# Patient Record
Sex: Female | Born: 1955 | Race: Black or African American | Hispanic: No | Marital: Single | Smoking: Never smoker
Health system: Southern US, Community
[De-identification: ages and names within clinical notes are randomized; demographics above are authoritative.]

## PROBLEM LIST (undated history)

## (undated) DIAGNOSIS — G3184 Mild cognitive impairment, so stated: Secondary | ICD-10-CM

## (undated) DIAGNOSIS — F0390 Unspecified dementia without behavioral disturbance: Secondary | ICD-10-CM

## (undated) DIAGNOSIS — I639 Cerebral infarction, unspecified: Secondary | ICD-10-CM

## (undated) DIAGNOSIS — F419 Anxiety disorder, unspecified: Secondary | ICD-10-CM

## (undated) DIAGNOSIS — F329 Major depressive disorder, single episode, unspecified: Secondary | ICD-10-CM

## (undated) DIAGNOSIS — F32A Depression, unspecified: Secondary | ICD-10-CM

## (undated) DIAGNOSIS — E119 Type 2 diabetes mellitus without complications: Secondary | ICD-10-CM

## (undated) DIAGNOSIS — T7491XA Unspecified adult maltreatment, confirmed, initial encounter: Secondary | ICD-10-CM

## (undated) DIAGNOSIS — R1310 Dysphagia, unspecified: Secondary | ICD-10-CM

## (undated) DIAGNOSIS — E1140 Type 2 diabetes mellitus with diabetic neuropathy, unspecified: Secondary | ICD-10-CM

## (undated) DIAGNOSIS — G20 Parkinson's disease: Secondary | ICD-10-CM

## (undated) DIAGNOSIS — F1010 Alcohol abuse, uncomplicated: Secondary | ICD-10-CM

## (undated) DIAGNOSIS — I10 Essential (primary) hypertension: Secondary | ICD-10-CM

## (undated) DIAGNOSIS — E785 Hyperlipidemia, unspecified: Secondary | ICD-10-CM

## (undated) HISTORY — PX: CHOLECYSTECTOMY: SHX55

---

## 2009-09-28 ENCOUNTER — Encounter: Payer: Self-pay | Admitting: Emergency Medicine

## 2009-10-12 ENCOUNTER — Encounter: Payer: Self-pay | Admitting: Emergency Medicine

## 2009-11-12 ENCOUNTER — Encounter: Payer: Self-pay | Admitting: Emergency Medicine

## 2009-12-12 ENCOUNTER — Encounter: Payer: Self-pay | Admitting: Emergency Medicine

## 2011-09-09 ENCOUNTER — Encounter: Payer: Self-pay | Admitting: Cardiothoracic Surgery

## 2011-09-09 ENCOUNTER — Encounter: Payer: Self-pay | Admitting: Nurse Practitioner

## 2011-09-12 ENCOUNTER — Encounter: Payer: Self-pay | Admitting: Nurse Practitioner

## 2011-09-12 ENCOUNTER — Encounter: Payer: Self-pay | Admitting: Cardiothoracic Surgery

## 2011-09-16 ENCOUNTER — Ambulatory Visit: Payer: Self-pay | Admitting: Cardiothoracic Surgery

## 2011-09-16 IMAGING — CR DG FOOT COMPLETE 3+V*L*
1 series · 3 of 3 positions shown · non-contrast
Comparison: none

REASON FOR EXAM: asses for ostomyelitis in 5th metatarsal pain fax
[PHONE_NUMBER]
COMMENTS:

PROCEDURE:     KDR - KDXR FOOT LT COMP W/OBLIQUES  - [DATE] [DATE]
RESULT:     Left foot images demonstrate ill-defined borders in the distal
phalanx of the fifth toe. Correlate for bony resorption or underlying
osteomyelitis. Subacute fracture could give a similar appearance.

[Series 1: ap · 0.17mm/px · 3 of 3 slices shown]
[im 1/3]
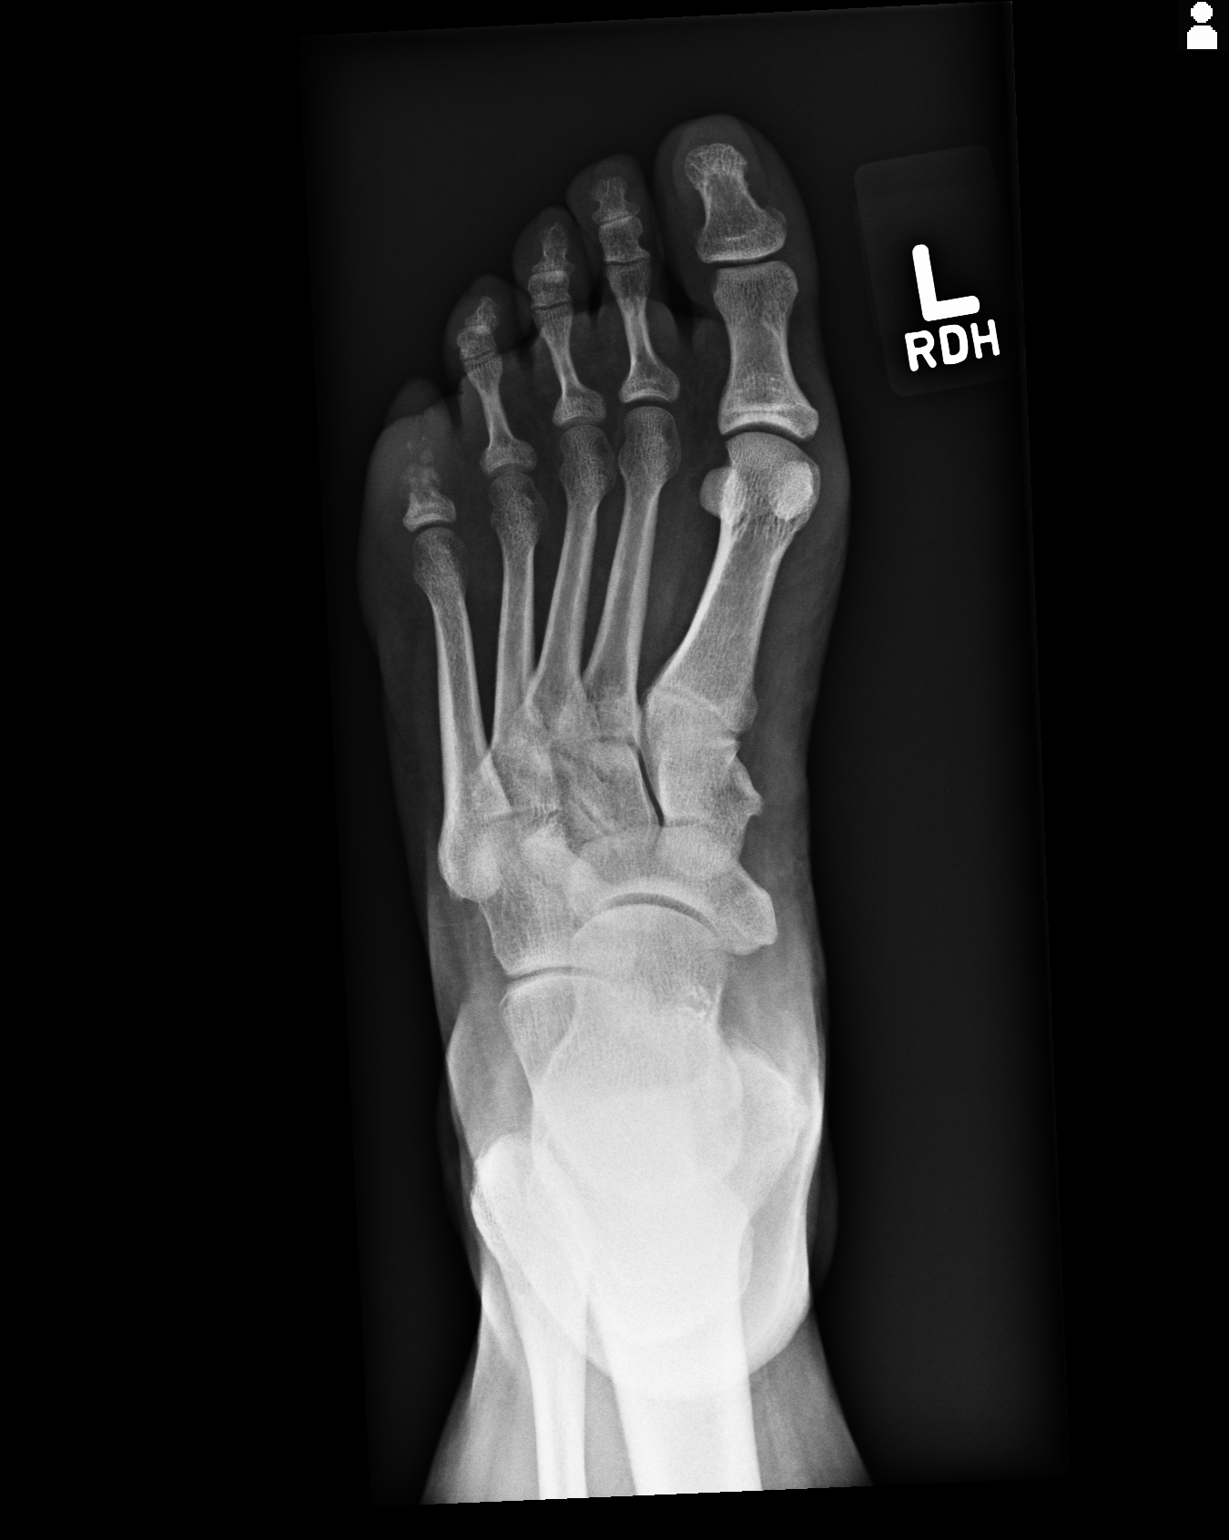
[im 2/3]
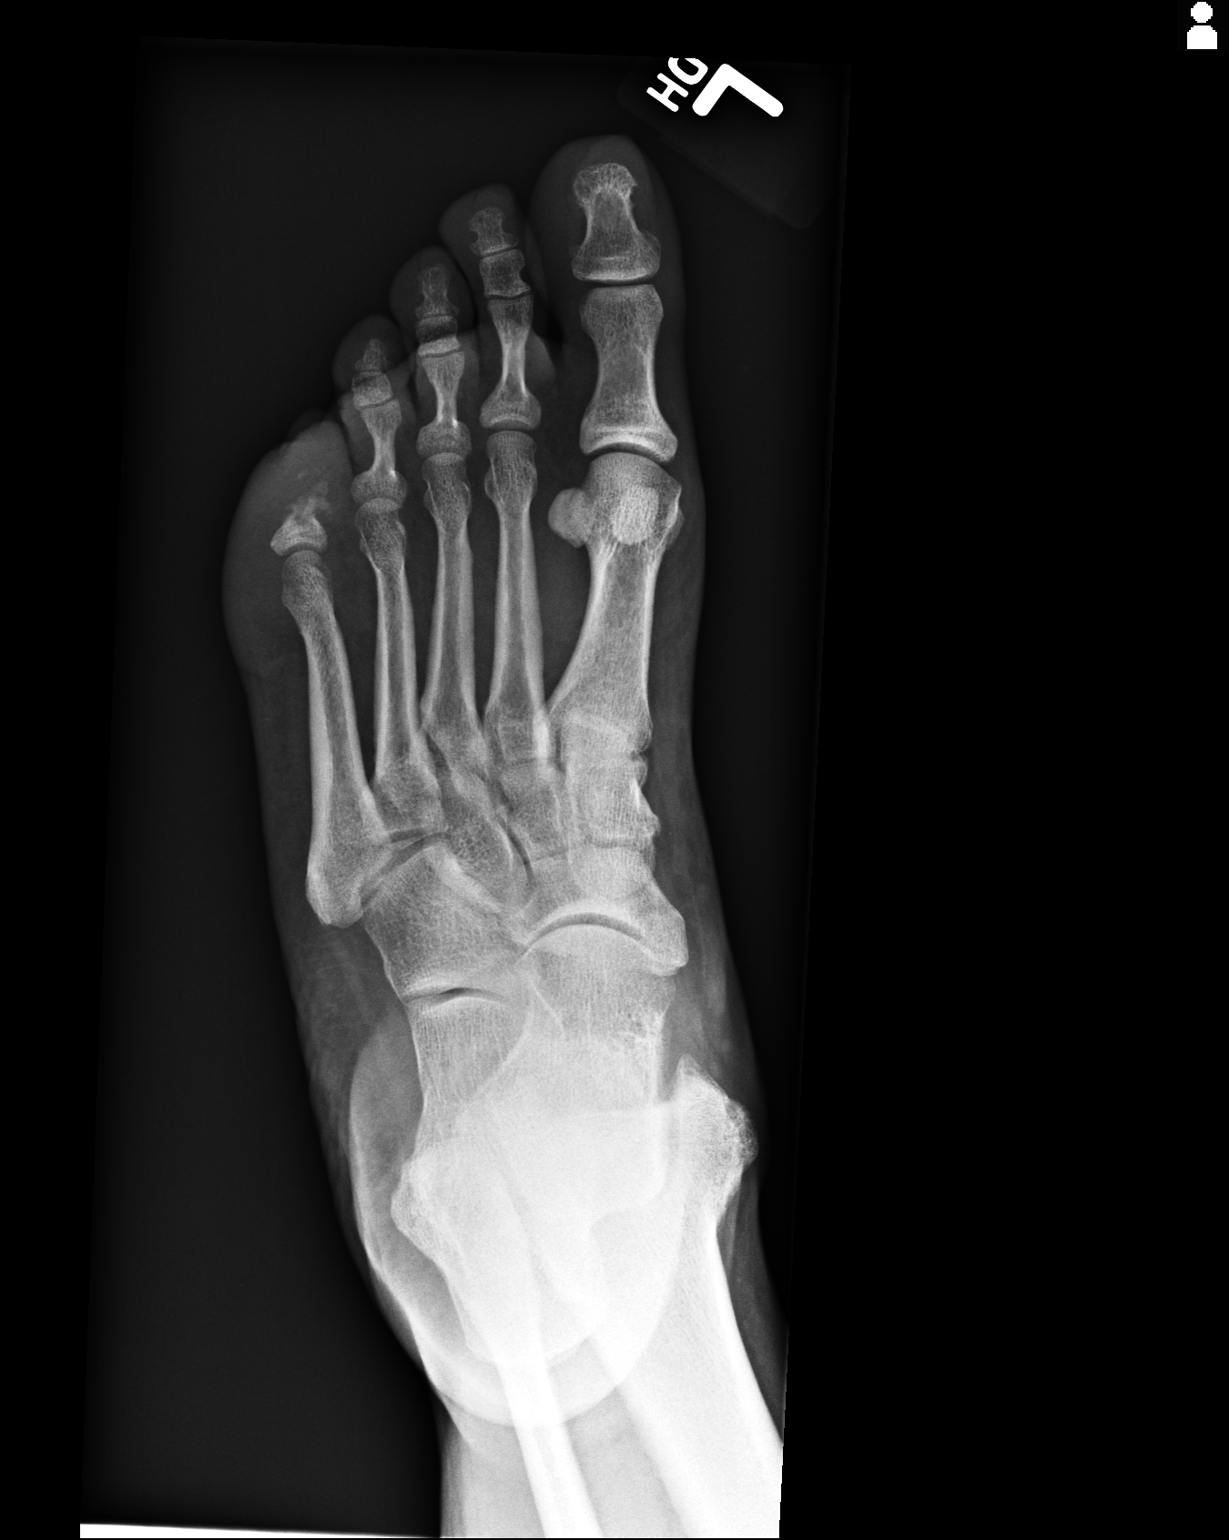
[im 3/3]
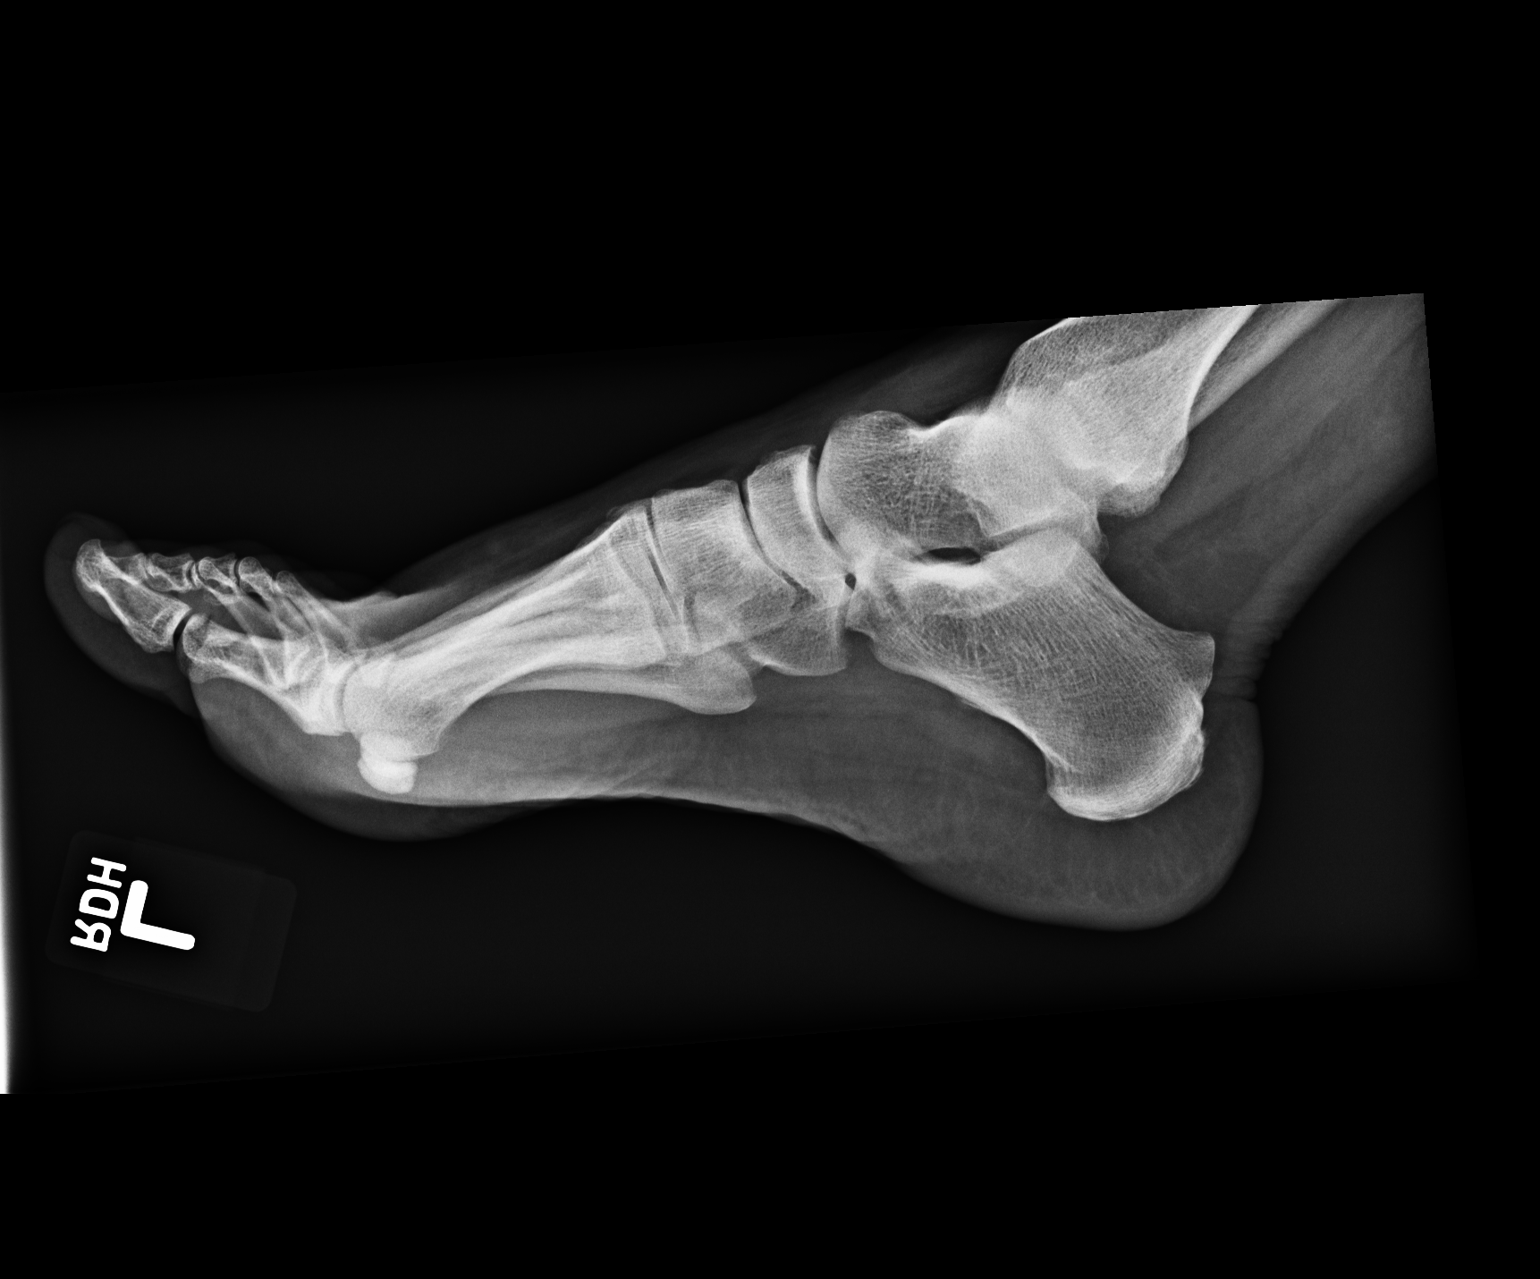

[3 of 3 positions shown; findings below may reference images not displayed]

IMPRESSION: 1. Abnormal appearance of the fifth toe. Correlate for chronic injury or
infection. Orthopedic followup is suggested.

[REDACTED]

## 2011-10-04 ENCOUNTER — Ambulatory Visit: Payer: Self-pay | Admitting: Nurse Practitioner

## 2011-10-13 ENCOUNTER — Ambulatory Visit: Payer: Self-pay | Admitting: Nurse Practitioner

## 2011-11-13 ENCOUNTER — Ambulatory Visit: Payer: Self-pay | Admitting: Nurse Practitioner

## 2013-11-13 ENCOUNTER — Ambulatory Visit: Payer: Self-pay

## 2013-11-13 IMAGING — CR DG FOOT COMPLETE 3+V*L*
1 series · 3 of 3 positions shown · non-contrast
Comparison: [DATE].

CLINICAL DATA: Diabetes. History of neuropathy. Nonhealing wound
left fifth toe.

EXAM:
LEFT FOOT - COMPLETE 3+ VIEW

[Series 1: dxr foot lt comp w/obliques · 0.14mm/px · 3 of 3 slices shown]
[im 1/3]
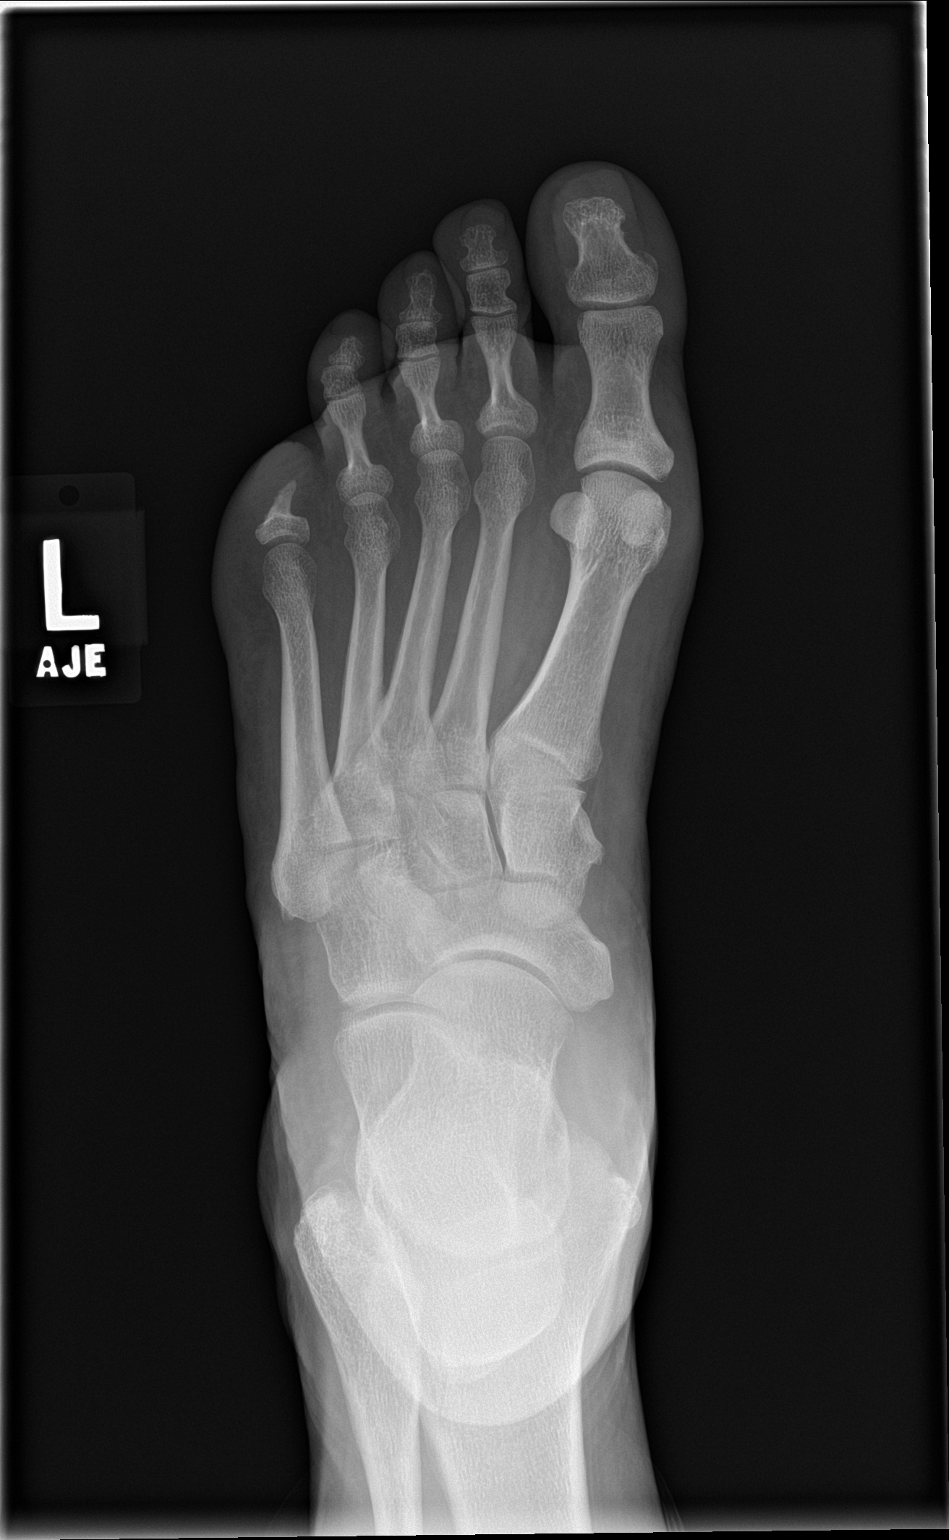
[im 2/3]
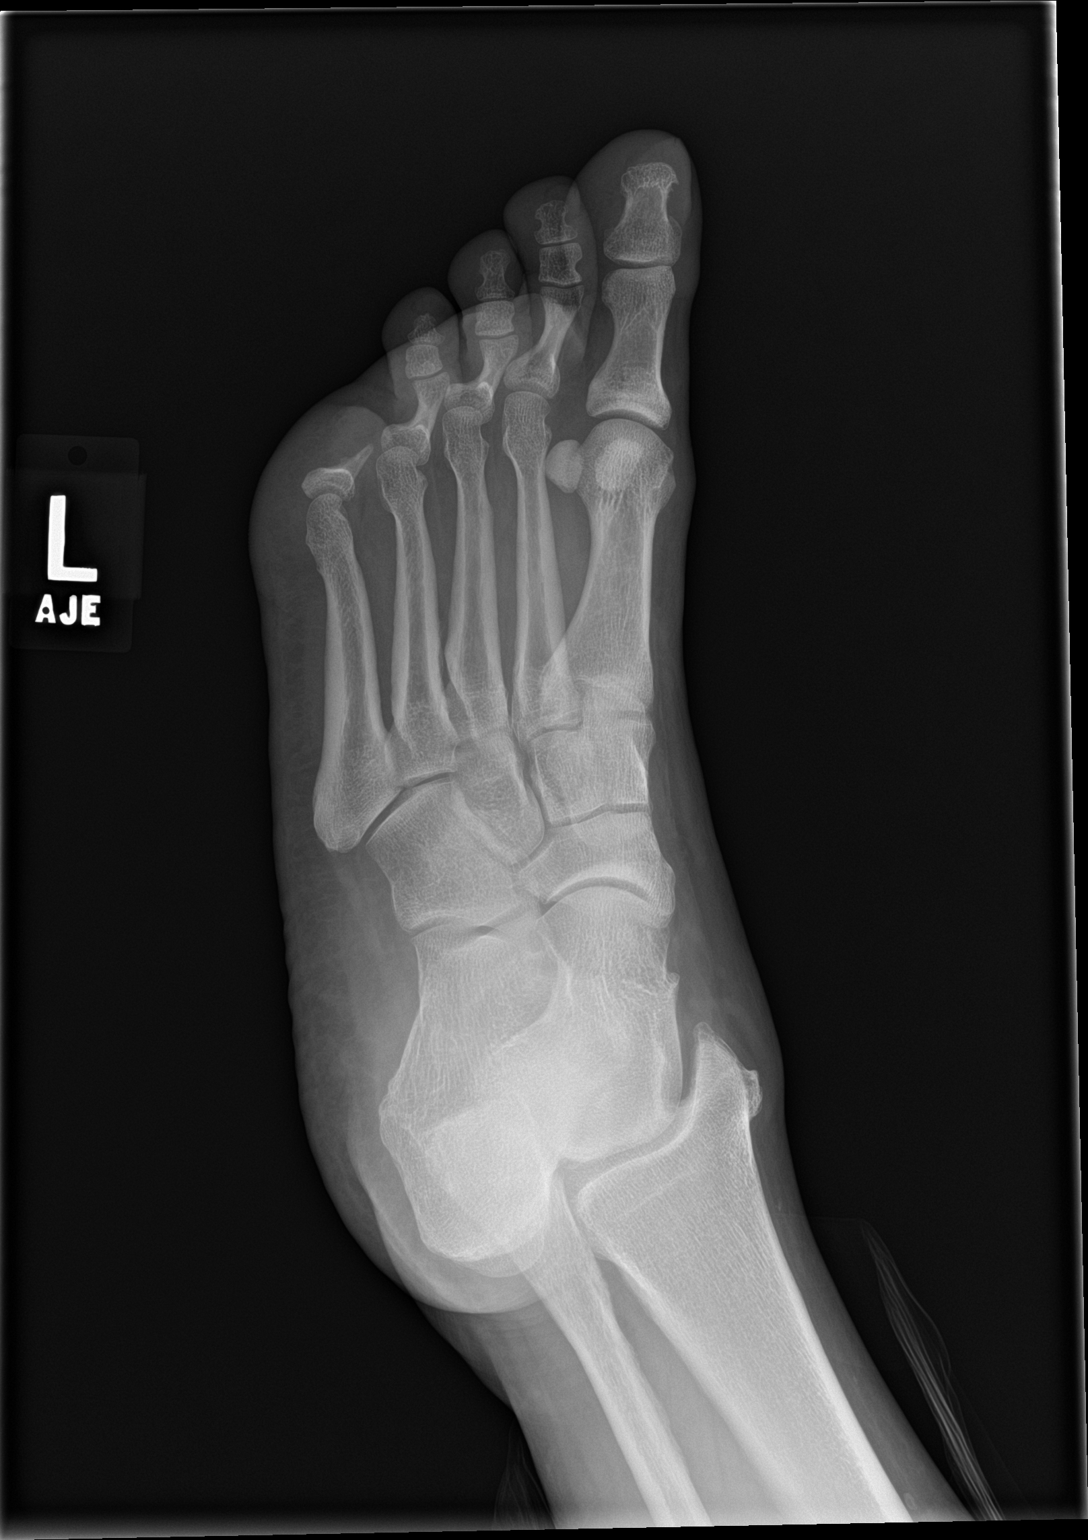
[im 3/3]
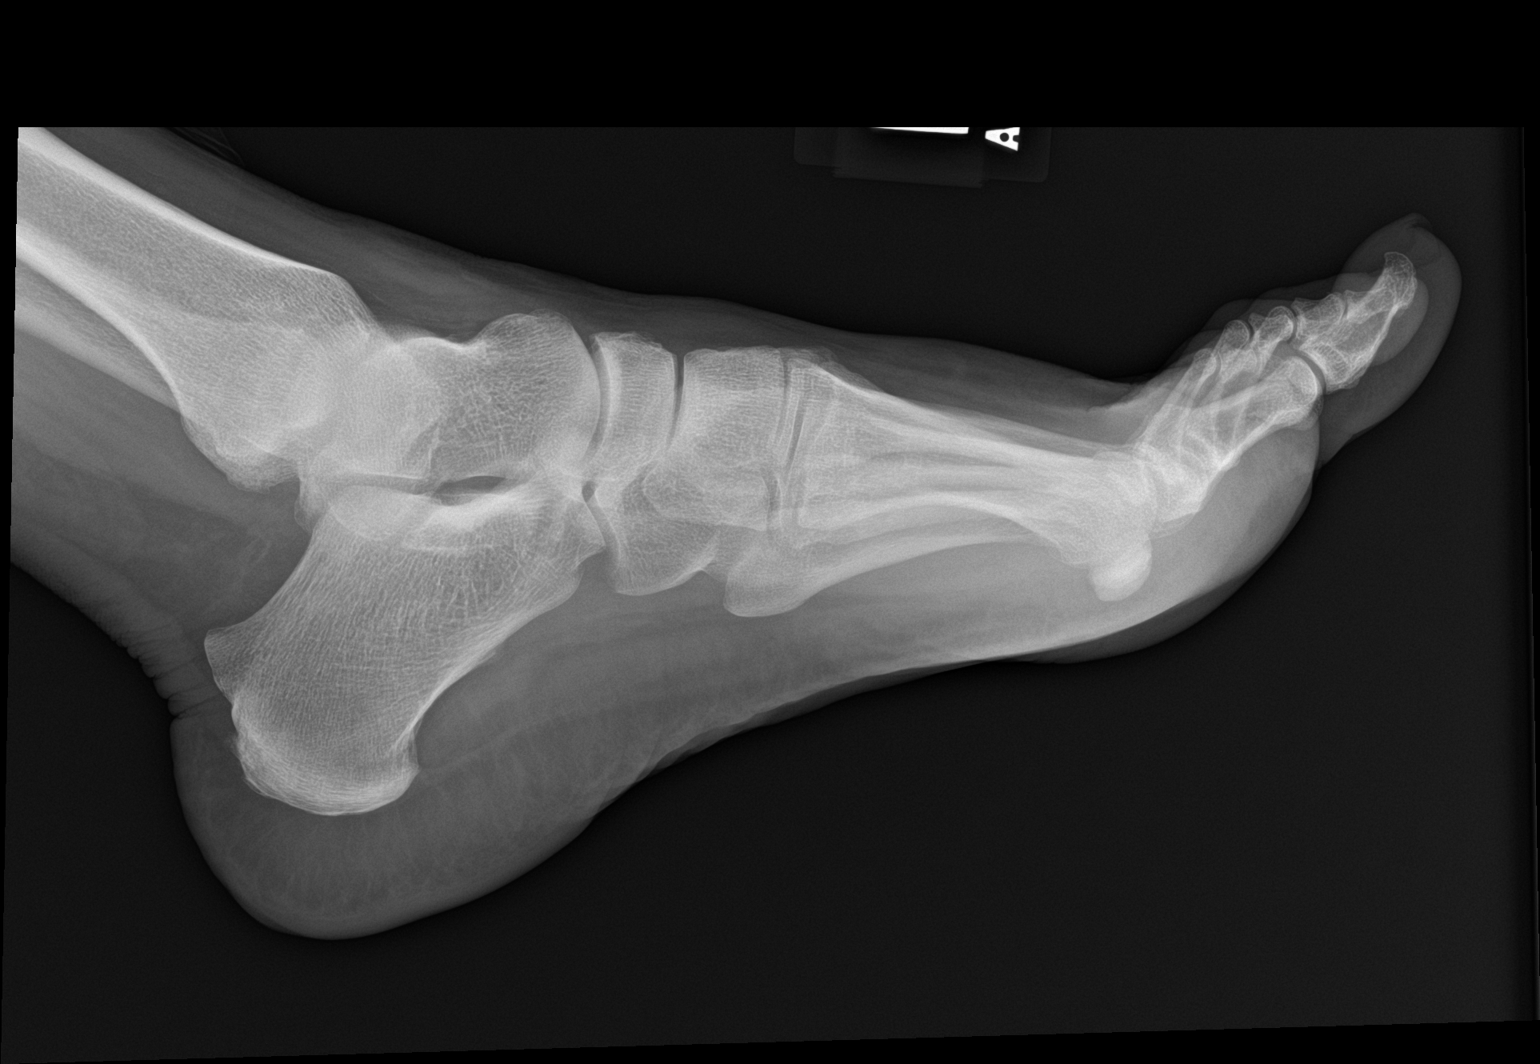

[3 of 3 positions shown; findings below may reference images not displayed]

FINDINGS: Postoperative appearance of the right toe compatible with partial
amputation. No focal bone erosions are identified on today's exam.
No radiopaque foreign bodies are soft tissue calcifications.
IMPRESSION: 1. Partial amputation of the fifth digit. No erosions to suggest
acute osteomyelitis.

## 2017-07-06 DIAGNOSIS — Z8673 Personal history of transient ischemic attack (TIA), and cerebral infarction without residual deficits: Secondary | ICD-10-CM | POA: Insufficient documentation

## 2017-07-06 DIAGNOSIS — R410 Disorientation, unspecified: Secondary | ICD-10-CM | POA: Insufficient documentation

## 2017-07-06 DIAGNOSIS — R413 Other amnesia: Secondary | ICD-10-CM | POA: Insufficient documentation

## 2017-07-11 ENCOUNTER — Emergency Department (HOSPITAL_COMMUNITY): Payer: Medicare HMO

## 2017-07-11 ENCOUNTER — Encounter (HOSPITAL_COMMUNITY): Payer: Self-pay | Admitting: Emergency Medicine

## 2017-07-11 ENCOUNTER — Emergency Department (HOSPITAL_COMMUNITY)
Admission: EM | Admit: 2017-07-11 | Discharge: 2017-07-11 | Disposition: A | Discharge status: Home / Self Care | Payer: Medicare HMO | Attending: Emergency Medicine | Admitting: Emergency Medicine

## 2017-07-11 ENCOUNTER — Other Ambulatory Visit: Payer: Self-pay

## 2017-07-11 DIAGNOSIS — Z7901 Long term (current) use of anticoagulants: Secondary | ICD-10-CM | POA: Diagnosis not present

## 2017-07-11 DIAGNOSIS — E1140 Type 2 diabetes mellitus with diabetic neuropathy, unspecified: Secondary | ICD-10-CM | POA: Insufficient documentation

## 2017-07-11 DIAGNOSIS — N390 Urinary tract infection, site not specified: Secondary | ICD-10-CM | POA: Diagnosis not present

## 2017-07-11 DIAGNOSIS — R51 Headache: Secondary | ICD-10-CM | POA: Diagnosis present

## 2017-07-11 DIAGNOSIS — Z7984 Long term (current) use of oral hypoglycemic drugs: Secondary | ICD-10-CM | POA: Diagnosis not present

## 2017-07-11 DIAGNOSIS — I10 Essential (primary) hypertension: Secondary | ICD-10-CM | POA: Insufficient documentation

## 2017-07-11 DIAGNOSIS — Z8673 Personal history of transient ischemic attack (TIA), and cerebral infarction without residual deficits: Secondary | ICD-10-CM | POA: Insufficient documentation

## 2017-07-11 HISTORY — DX: Depression, unspecified: F32.A

## 2017-07-11 HISTORY — DX: Mild cognitive impairment of uncertain or unknown etiology: G31.84

## 2017-07-11 HISTORY — DX: Major depressive disorder, single episode, unspecified: F32.9

## 2017-07-11 HISTORY — DX: Cerebral infarction, unspecified: I63.9

## 2017-07-11 HISTORY — DX: Type 2 diabetes mellitus with diabetic neuropathy, unspecified: E11.40

## 2017-07-11 HISTORY — DX: Unspecified adult maltreatment, confirmed, initial encounter: T74.91XA

## 2017-07-11 HISTORY — DX: Alcohol abuse, uncomplicated: F10.10

## 2017-07-11 HISTORY — DX: Essential (primary) hypertension: I10

## 2017-07-11 HISTORY — DX: Anxiety disorder, unspecified: F41.9

## 2017-07-11 HISTORY — DX: Type 2 diabetes mellitus without complications: E11.9

## 2017-07-11 LAB — COMPREHENSIVE METABOLIC PANEL
ALT: 22 U/L (ref 14–54)
AST: 29 U/L (ref 15–41)
Albumin: 4.1 g/dL (ref 3.5–5.0)
Alkaline Phosphatase: 52 U/L (ref 38–126)
Anion gap: 14 (ref 5–15)
BUN: 6 mg/dL (ref 6–20)
CO2: 24 mmol/L (ref 22–32)
Calcium: 9.7 mg/dL (ref 8.9–10.3)
Chloride: 98 mmol/L — ABNORMAL LOW (ref 101–111)
Creatinine, Ser: 0.57 mg/dL (ref 0.44–1.00)
GFR calc Af Amer: >60 mL/min (ref >60)
GFR calc non Af Amer: >60 mL/min (ref >60)
Glucose, Bld: 186 mg/dL — ABNORMAL HIGH (ref 65–99)
Potassium: 3.1 mmol/L — ABNORMAL LOW (ref 3.5–5.1)
Sodium: 136 mmol/L (ref 135–145)
Total Bilirubin: 0.9 mg/dL (ref 0.3–1.2)
Total Protein: 7.9 g/dL (ref 6.5–8.1)

## 2017-07-11 LAB — CBC WITH DIFFERENTIAL/PLATELET
Basophils Absolute: 0 10*3/uL (ref 0.0–0.1)
Basophils Relative: 0 %
Eosinophils Absolute: 0.1 10*3/uL (ref 0.0–0.7)
Eosinophils Relative: 1 %
HCT: 39.2 % (ref 36.0–46.0)
Hemoglobin: 13.6 g/dL (ref 12.0–15.0)
Lymphocytes Relative: 22 %
Lymphs Abs: 1.2 10*3/uL (ref 0.7–4.0)
MCH: 29.4 pg (ref 26.0–34.0)
MCHC: 34.7 g/dL (ref 30.0–36.0)
MCV: 84.8 fL (ref 78.0–100.0)
Monocytes Absolute: 0.3 10*3/uL (ref 0.1–1.0)
Monocytes Relative: 6 %
Neutro Abs: 3.8 10*3/uL (ref 1.7–7.7)
Neutrophils Relative %: 71 %
Platelets: 263 10*3/uL (ref 150–400)
RBC: 4.62 MIL/uL (ref 3.87–5.11)
RDW: 15 % (ref 11.5–15.5)
WBC: 5.3 10*3/uL (ref 4.0–10.5)

## 2017-07-11 LAB — URINALYSIS, ROUTINE W REFLEX MICROSCOPIC
Bacteria, UA: NONE SEEN
Bilirubin Urine: NEGATIVE
Glucose, UA: 50 mg/dL — AB
Ketones, ur: 20 mg/dL — AB
Nitrite: NEGATIVE
Protein, ur: NEGATIVE mg/dL
Specific Gravity, Urine: 1.015 (ref 1.005–1.030)
pH: 5 (ref 5.0–8.0)

## 2017-07-11 MED ORDER — CEPHALEXIN 500 MG PO CAPS
500.0000 mg | ORAL_CAPSULE | Freq: Once | ORAL | Status: AC
  Administered 2017-07-11: 500 mg via ORAL
  Filled 2017-07-11: qty 1

## 2017-07-11 MED ORDER — CEPHALEXIN 500 MG PO CAPS: 500.0000 mg | ORAL_CAPSULE | Freq: Four times a day (QID) | ORAL | 0 refills | Status: DC
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
## 2017-07-11 NOTE — ED Notes (Signed)
Pt transported to xray 

## 2017-07-11 NOTE — Discharge Instructions (Addendum)
You have a urinary tract infection.  Antibiotic for same.  Radiologist recommended an MRI of your brain for further information.  Follow-up with your primary care doctor.
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
## 2017-07-11 NOTE — ED Notes (Signed)
[employer] at bedside with pt.

## 2017-07-11 NOTE — ED Notes (Signed)
[employer] at bedside
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
## 2017-07-13 LAB — URINE CULTURE

## 2017-08-16 DIAGNOSIS — R29818 Other symptoms and signs involving the nervous system: Secondary | ICD-10-CM

## 2018-12-04 DIAGNOSIS — S065XAA Traumatic subdural hemorrhage with loss of consciousness status unknown, initial encounter: Secondary | ICD-10-CM | POA: Insufficient documentation

## 2018-12-04 DIAGNOSIS — S065X9A Traumatic subdural hemorrhage with loss of consciousness of unspecified duration, initial encounter: Secondary | ICD-10-CM | POA: Insufficient documentation

## 2018-12-11 DIAGNOSIS — R519 Headache, unspecified: Secondary | ICD-10-CM | POA: Insufficient documentation

## 2018-12-11 DIAGNOSIS — R1310 Dysphagia, unspecified: Secondary | ICD-10-CM | POA: Insufficient documentation

## 2019-02-17 DIAGNOSIS — I2510 Atherosclerotic heart disease of native coronary artery without angina pectoris: Secondary | ICD-10-CM | POA: Insufficient documentation

## 2019-02-18 DIAGNOSIS — Z9181 History of falling: Secondary | ICD-10-CM | POA: Insufficient documentation

## 2019-02-18 DIAGNOSIS — I10 Essential (primary) hypertension: Secondary | ICD-10-CM | POA: Insufficient documentation

## 2019-02-18 DIAGNOSIS — Z8659 Personal history of other mental and behavioral disorders: Secondary | ICD-10-CM | POA: Insufficient documentation

## 2019-02-18 DIAGNOSIS — Z8679 Personal history of other diseases of the circulatory system: Secondary | ICD-10-CM | POA: Insufficient documentation

## 2019-02-18 DIAGNOSIS — E119 Type 2 diabetes mellitus without complications: Secondary | ICD-10-CM | POA: Insufficient documentation

## 2019-03-02 DIAGNOSIS — L298 Other pruritus: Secondary | ICD-10-CM | POA: Insufficient documentation

## 2019-03-02 DIAGNOSIS — G8918 Other acute postprocedural pain: Secondary | ICD-10-CM | POA: Insufficient documentation

## 2019-03-02 DIAGNOSIS — Z951 Presence of aortocoronary bypass graft: Secondary | ICD-10-CM | POA: Insufficient documentation

## 2019-03-02 DIAGNOSIS — D649 Anemia, unspecified: Secondary | ICD-10-CM | POA: Insufficient documentation

## 2020-05-10 DIAGNOSIS — E785 Hyperlipidemia, unspecified: Secondary | ICD-10-CM | POA: Insufficient documentation

## 2020-06-17 ENCOUNTER — Emergency Department (HOSPITAL_COMMUNITY): Payer: Medicare Other

## 2020-06-17 ENCOUNTER — Other Ambulatory Visit: Payer: Self-pay

## 2020-06-17 ENCOUNTER — Encounter (HOSPITAL_COMMUNITY): Payer: Self-pay | Admitting: Emergency Medicine

## 2020-06-17 ENCOUNTER — Emergency Department (HOSPITAL_COMMUNITY)
Admission: EM | Admit: 2020-06-17 | Discharge: 2020-06-17 | Disposition: A | Discharge status: Home / Self Care | Payer: Medicare Other | Attending: Emergency Medicine | Admitting: Emergency Medicine

## 2020-06-17 DIAGNOSIS — Z7982 Long term (current) use of aspirin: Secondary | ICD-10-CM | POA: Insufficient documentation

## 2020-06-17 DIAGNOSIS — W19XXXA Unspecified fall, initial encounter: Secondary | ICD-10-CM | POA: Insufficient documentation

## 2020-06-17 DIAGNOSIS — Z7984 Long term (current) use of oral hypoglycemic drugs: Secondary | ICD-10-CM | POA: Insufficient documentation

## 2020-06-17 DIAGNOSIS — M25551 Pain in right hip: Secondary | ICD-10-CM | POA: Insufficient documentation

## 2020-06-17 DIAGNOSIS — E1140 Type 2 diabetes mellitus with diabetic neuropathy, unspecified: Secondary | ICD-10-CM | POA: Diagnosis not present

## 2020-06-17 DIAGNOSIS — S0511XA Contusion of eyeball and orbital tissues, right eye, initial encounter: Secondary | ICD-10-CM | POA: Insufficient documentation

## 2020-06-17 DIAGNOSIS — Z7902 Long term (current) use of antithrombotics/antiplatelets: Secondary | ICD-10-CM | POA: Insufficient documentation

## 2020-06-17 DIAGNOSIS — S0990XA Unspecified injury of head, initial encounter: Secondary | ICD-10-CM | POA: Diagnosis not present

## 2020-06-17 DIAGNOSIS — M25552 Pain in left hip: Secondary | ICD-10-CM | POA: Diagnosis not present

## 2020-06-17 DIAGNOSIS — Z794 Long term (current) use of insulin: Secondary | ICD-10-CM | POA: Insufficient documentation

## 2020-06-17 DIAGNOSIS — Z79899 Other long term (current) drug therapy: Secondary | ICD-10-CM | POA: Diagnosis not present

## 2020-06-17 DIAGNOSIS — S0591XA Unspecified injury of right eye and orbit, initial encounter: Secondary | ICD-10-CM | POA: Diagnosis present

## 2020-06-17 LAB — CBC WITH DIFFERENTIAL/PLATELET
Abs Immature Granulocytes: 0.03 10*3/uL (ref 0.00–0.07)
Basophils Absolute: 0 10*3/uL (ref 0.0–0.1)
Basophils Relative: 0 %
Eosinophils Absolute: 0.3 10*3/uL (ref 0.0–0.5)
Eosinophils Relative: 3 %
HCT: 33.8 % — ABNORMAL LOW (ref 36.0–46.0)
Hemoglobin: 10.9 g/dL — ABNORMAL LOW (ref 12.0–15.0)
Immature Granulocytes: 0 %
Lymphocytes Relative: 20 %
Lymphs Abs: 1.5 10*3/uL (ref 0.7–4.0)
MCH: 26.1 pg (ref 26.0–34.0)
MCHC: 32.2 g/dL (ref 30.0–36.0)
MCV: 80.9 fL (ref 80.0–100.0)
Monocytes Absolute: 0.5 10*3/uL (ref 0.1–1.0)
Monocytes Relative: 6 %
Neutro Abs: 5.4 10*3/uL (ref 1.7–7.7)
Neutrophils Relative %: 71 %
Platelets: 315 10*3/uL (ref 150–400)
RBC: 4.18 MIL/uL (ref 3.87–5.11)
RDW: 15.3 % (ref 11.5–15.5)
WBC: 7.6 10*3/uL (ref 4.0–10.5)
nRBC: 0 % (ref 0.0–0.2)

## 2020-06-17 LAB — URINALYSIS, ROUTINE W REFLEX MICROSCOPIC
Bilirubin Urine: NEGATIVE
Glucose, UA: NEGATIVE mg/dL
Hgb urine dipstick: NEGATIVE
Ketones, ur: NEGATIVE mg/dL
Leukocytes,Ua: NEGATIVE
Nitrite: NEGATIVE
Protein, ur: NEGATIVE mg/dL
Specific Gravity, Urine: 1.006 (ref 1.005–1.030)
pH: 7 (ref 5.0–8.0)

## 2020-06-17 LAB — COMPREHENSIVE METABOLIC PANEL
ALT: 29 U/L (ref 0–44)
AST: 73 U/L — ABNORMAL HIGH (ref 15–41)
Albumin: 3.9 g/dL (ref 3.5–5.0)
Alkaline Phosphatase: 73 U/L (ref 38–126)
Anion gap: 13 (ref 5–15)
BUN: 19 mg/dL (ref 8–23)
CO2: 23 mmol/L (ref 22–32)
Calcium: 9.3 mg/dL (ref 8.9–10.3)
Chloride: 102 mmol/L (ref 98–111)
Creatinine, Ser: 0.79 mg/dL (ref 0.44–1.00)
GFR, Estimated: >60 mL/min (ref >60)
Glucose, Bld: 79 mg/dL (ref 70–99)
Potassium: 4.1 mmol/L (ref 3.5–5.1)
Sodium: 138 mmol/L (ref 135–145)
Total Bilirubin: 0.8 mg/dL (ref 0.3–1.2)
Total Protein: 7.9 g/dL (ref 6.5–8.1)

## 2020-06-17 IMAGING — DX DG HIP (WITH OR WITHOUT PELVIS) 3-4V BILAT
5 series · 5 of 5 positions shown · non-contrast
Comparison: [DATE].

CLINICAL DATA: Bilateral hip pain after fall several days ago.

EXAM:
DG HIP (WITH OR WITHOUT PELVIS) 3-4V BILAT

[pelvis ap]
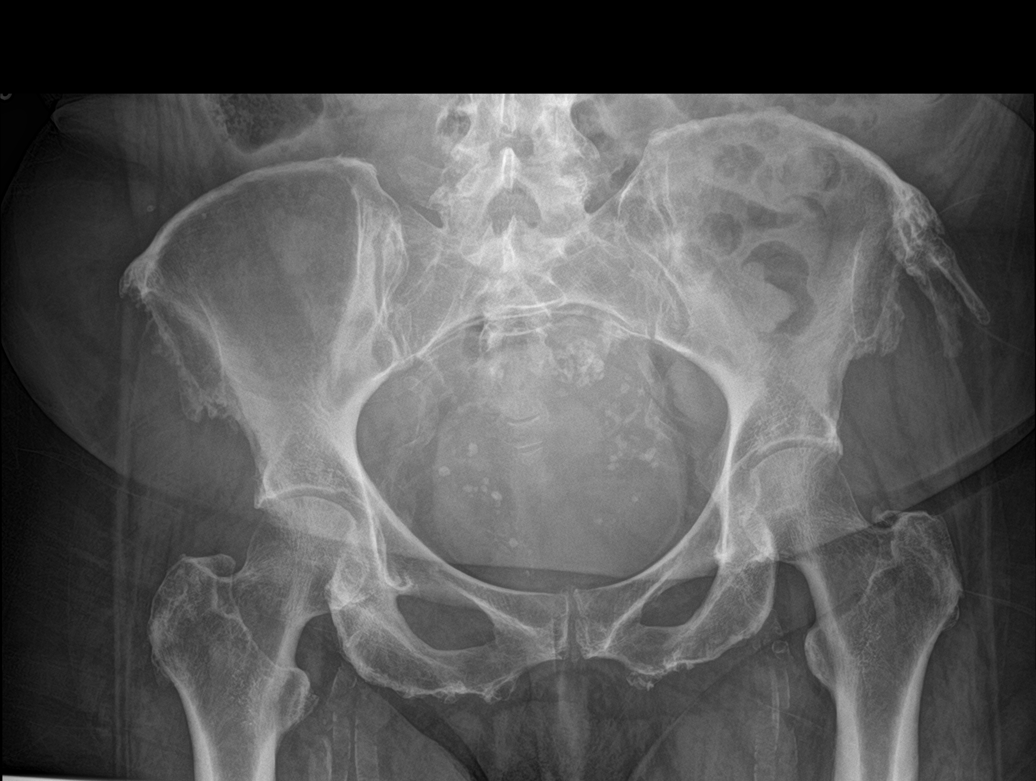

[hip ap (1 of 2)]
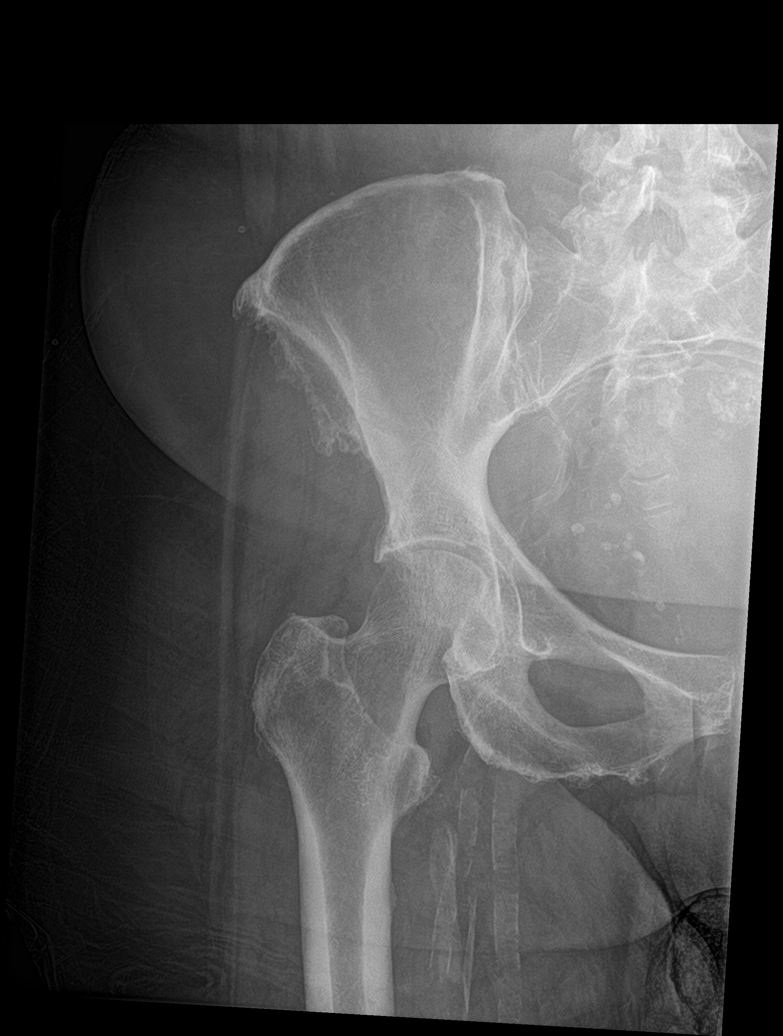

[hip lat (1 of 2)]
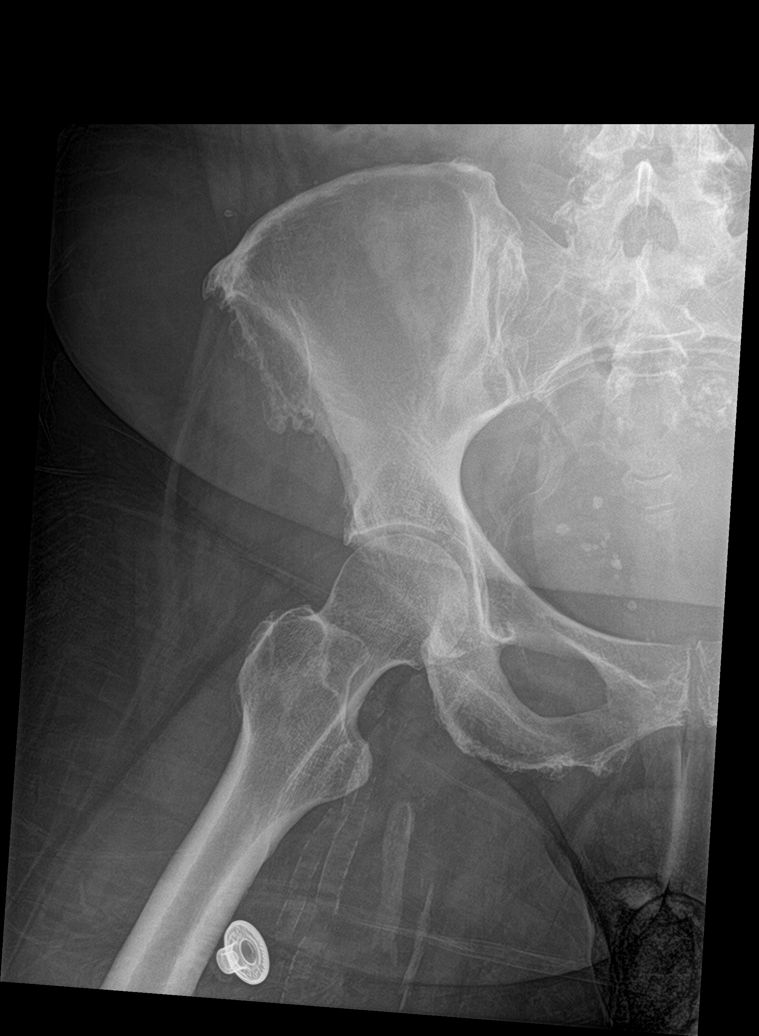

[hip ap (2 of 2)]
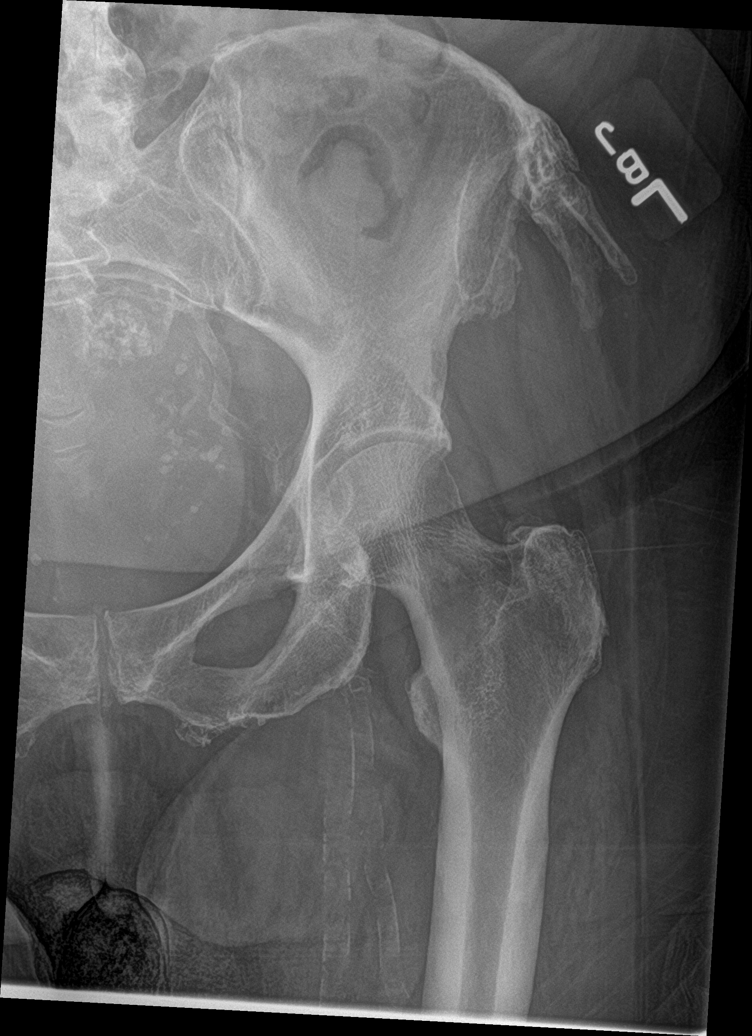

[hip lat (2 of 2)]
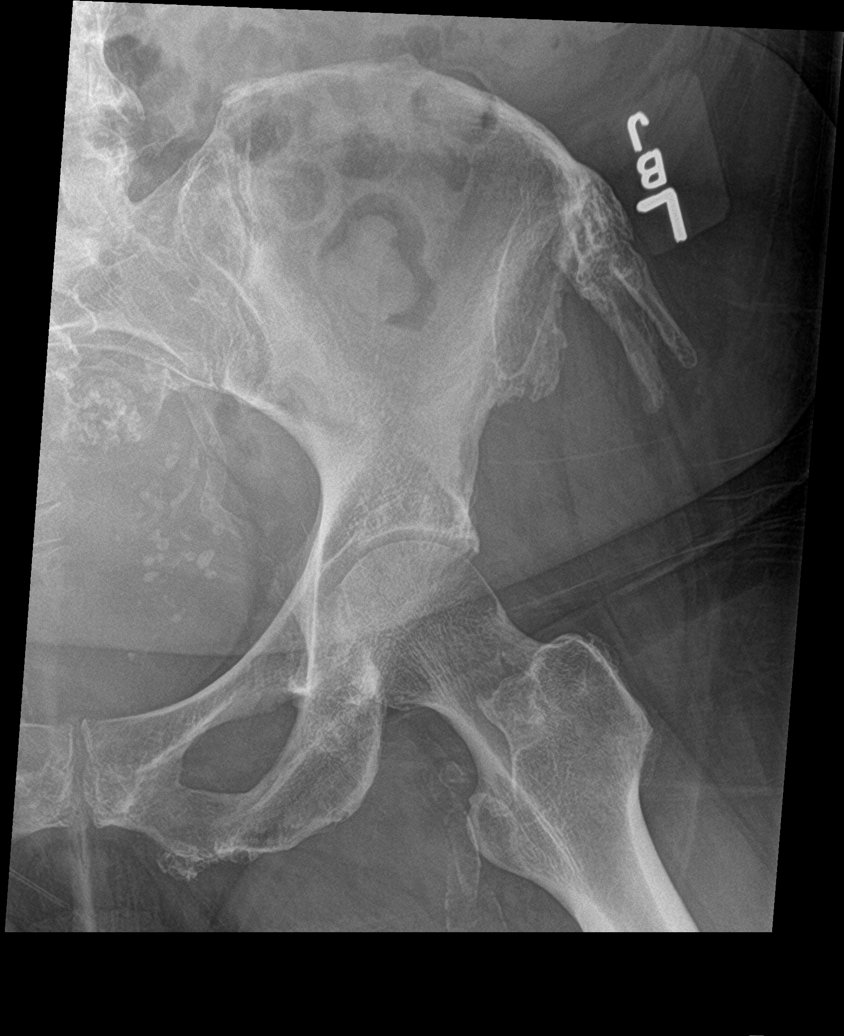

[5 of 5 positions shown; findings below may reference images not displayed]

FINDINGS: There is no evidence of hip fracture or dislocation. There is no
evidence of arthropathy or other focal bone abnormality.
IMPRESSION: Negative.

## 2020-06-17 IMAGING — CT CT MAXILLOFACIAL W/O CM
3 of 6 series · 15 of 47 positions shown, 18 images · non-contrast
Comparison: Head CT [DATE]

CLINICAL DATA: Fall 1 week ago. Injury to the right orbital region.

EXAM:
CT HEAD WITHOUT CONTRAST
CT MAXILLOFACIAL WITHOUT CONTRAST
TECHNIQUE: Multidetector CT imaging of the head and maxillofacial structures
were performed using the standard protocol without intravenous
contrast. Multiplanar CT image reconstructions of the maxillofacial
structures were also generated.

[Series 4: head bone · axial · 0.42mm/px · z∈[-13,+129]mm · 10 of 83 slices shown, 13 images]
[im 6/83  brain]
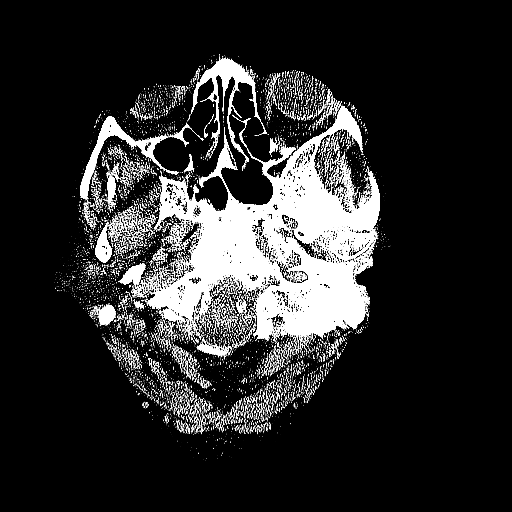
[im 6/83  bone]
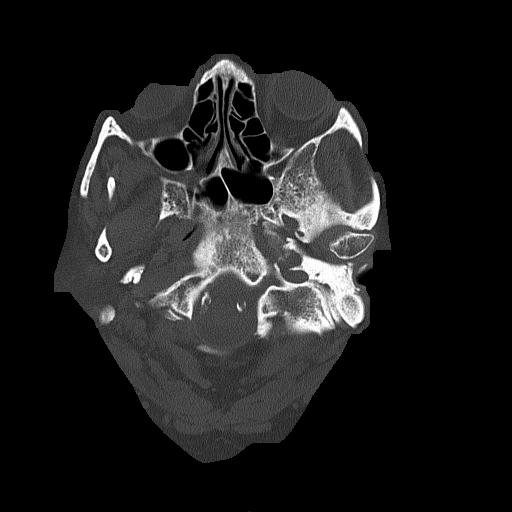
[im 17/83  bone]
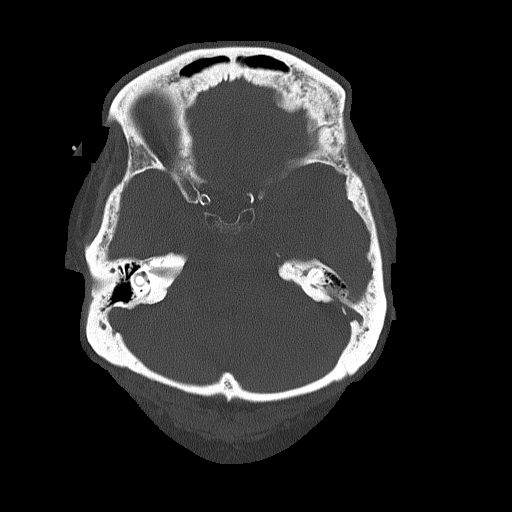
[im 22/83  bone]
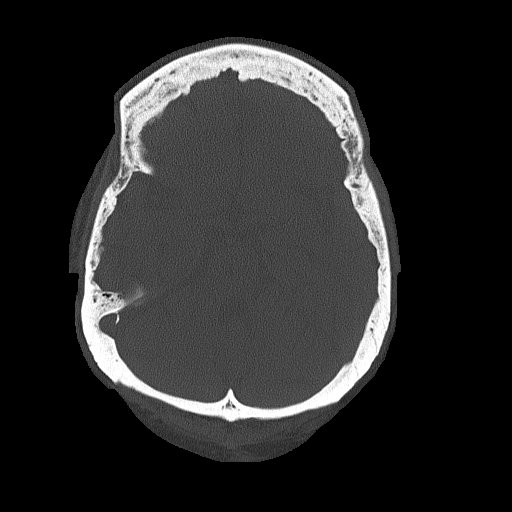
[im 28/83  bone]
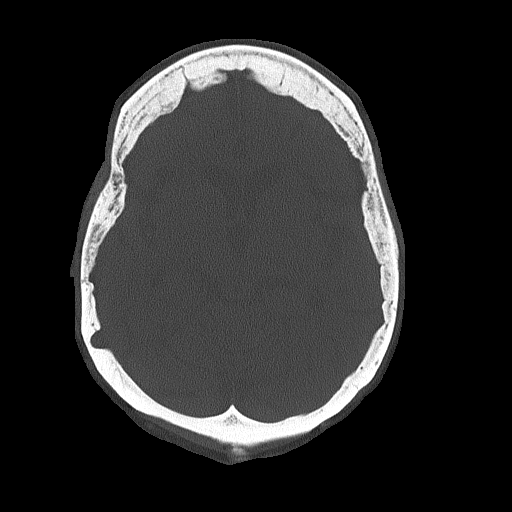
[im 39/83  brain]
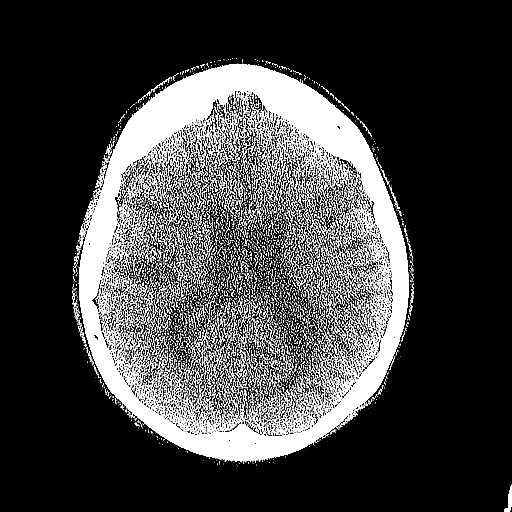
[im 39/83  bone]
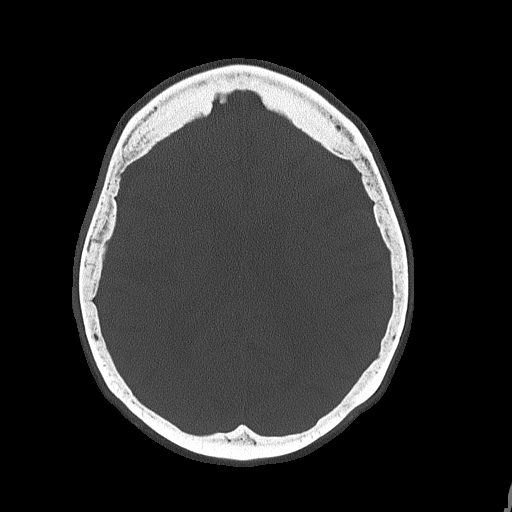
[im 44/83  bone]
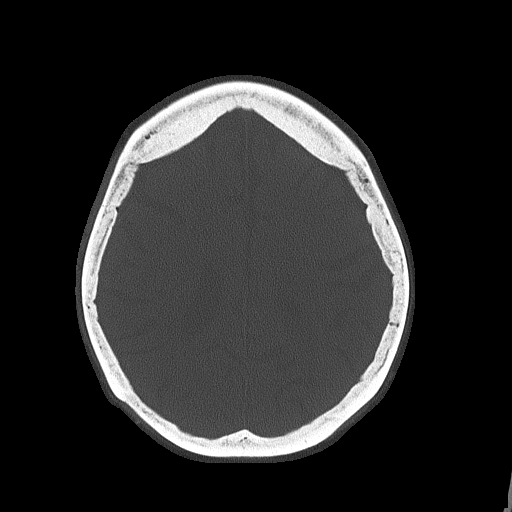
[im 55/83  bone]
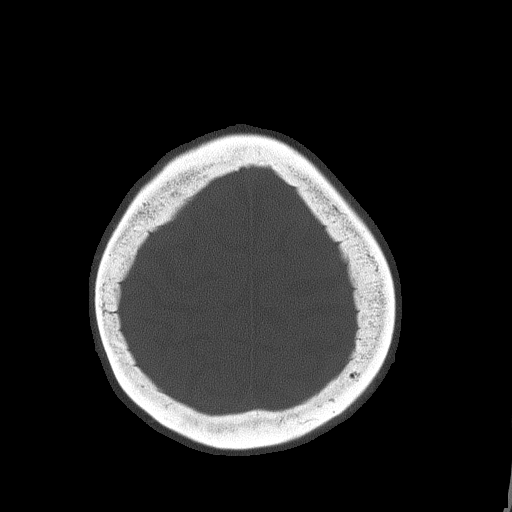
[im 61/83  bone]
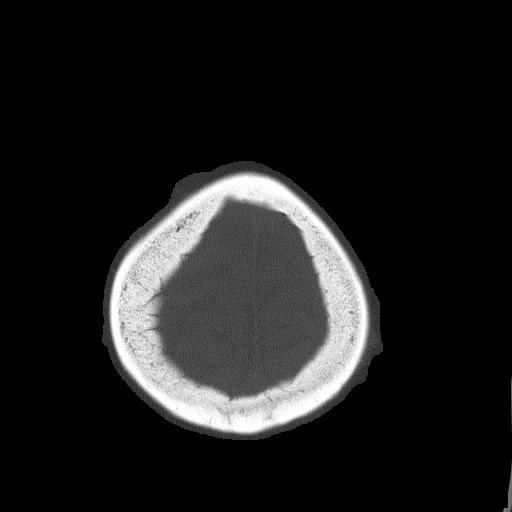
[im 66/83  brain]
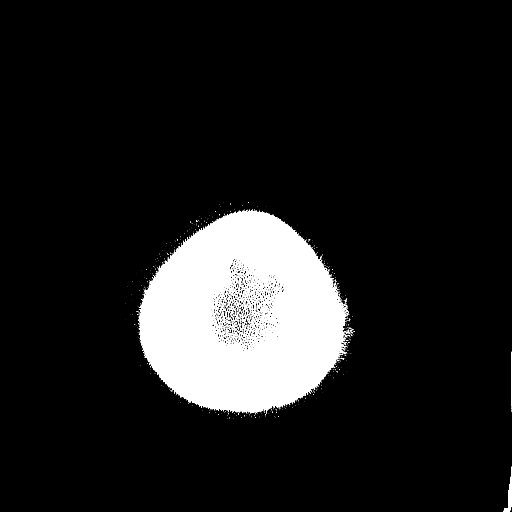
[im 66/83  bone]
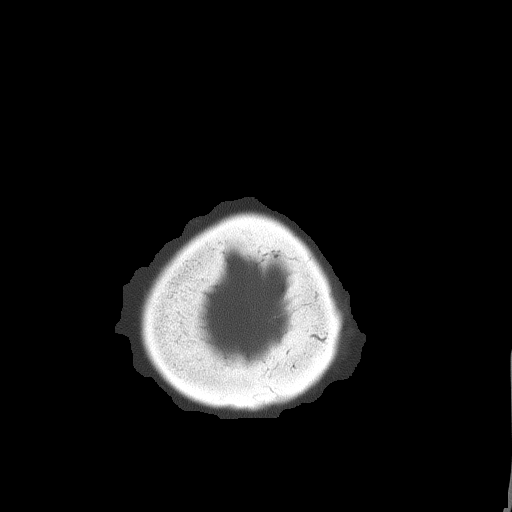
[im 77/83  bone]
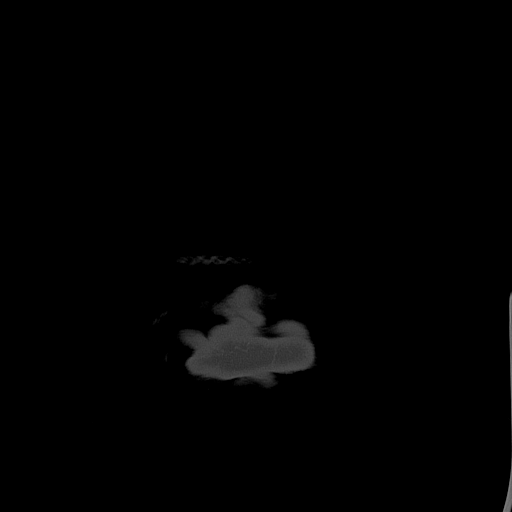

[Series 5: coronal soft · coronal · 0.31mm/px · 3 of 66 slices shown]
[im 21/66  bone]
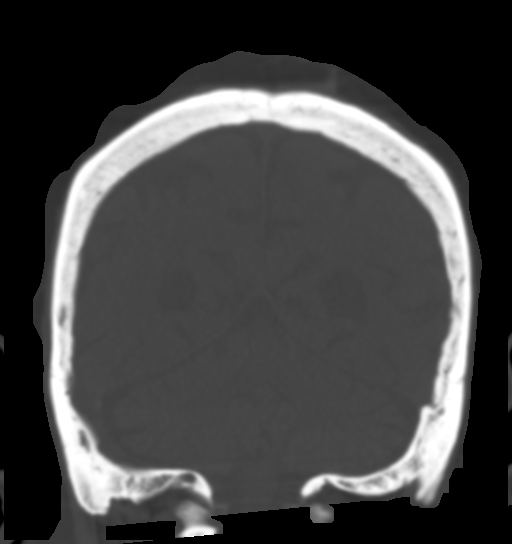
[im 42/66  bone]
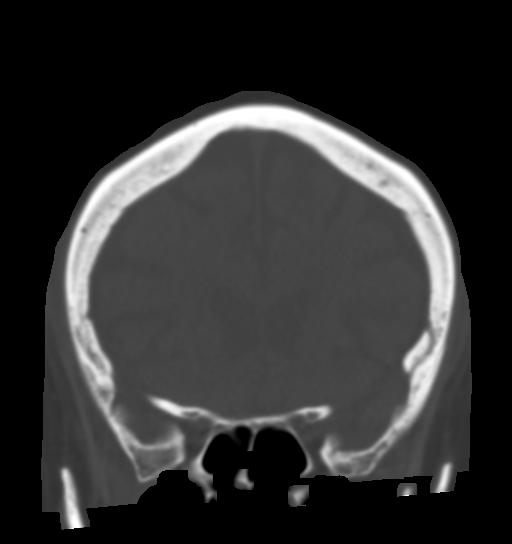
[im 63/66  bone]
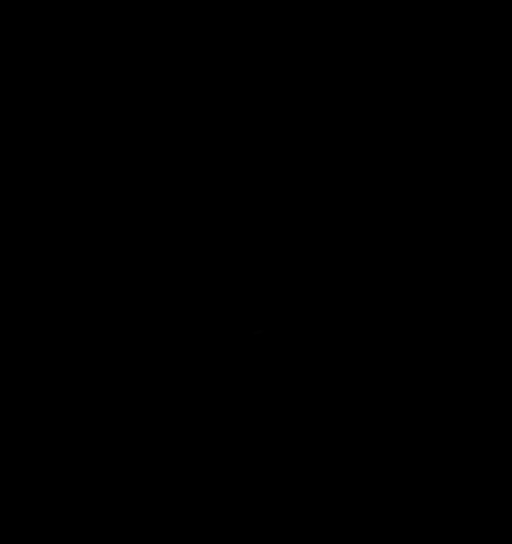

[Series 14: sagittal bone · sagittal · 0.30mm/px · 2 of 85 slices shown]
[im 29/85  bone]
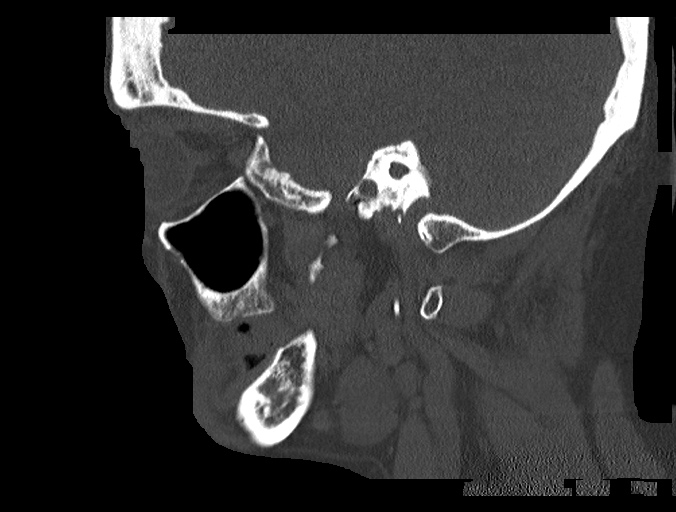
[im 57/85  bone]
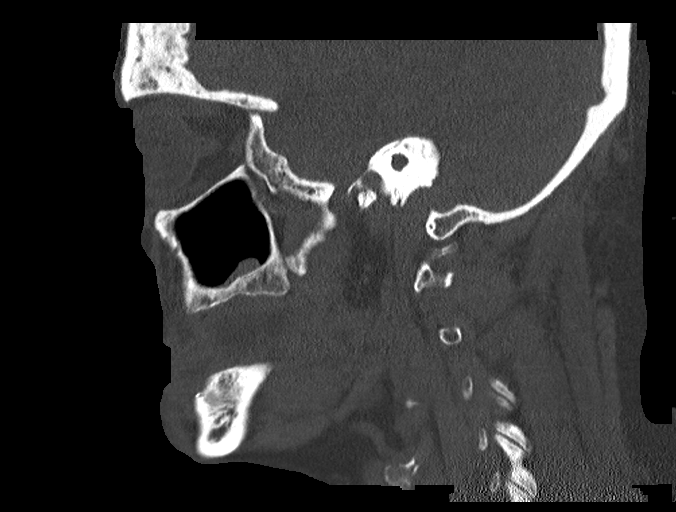

[15 of 47 positions shown; findings below may reference images not displayed]

FINDINGS: CT HEAD FINDINGS

Brain: There is no evidence of an acute infarct, intracranial
hemorrhage, mass, midline shift, or extra-axial fluid collection.
Hypodensities in the cerebral white matter bilaterally are unchanged
and nonspecific but compatible with mild chronic small vessel
ischemic disease. A chronic lacunar infarct is again seen in the
anterior limb of the right internal capsule. There is mild cerebral
atrophy. A partially empty sella is noted.

Vascular: Calcified atherosclerosis at the skull base. No hyperdense
vessel.

Skull: No fracture or suspicious osseous lesion.

Other: Small scalp contusion in the occipital region.

CT MAXILLOFACIAL FINDINGS

Osseous: No acute fracture, mandibular dislocation, or suspicious
osseous lesion. Edentulous. Mild upper cervical spondylosis.

Orbits: Grossly intact globes.  No retro bulbar hematoma or mass.

Sinuses: Paranasal sinuses and mastoid air cells are clear.

Soft tissues: Small right supraorbital hematoma on the right. Mild
carotid atherosclerosis.
IMPRESSION: 1. No evidence of acute intracranial abnormality.
2. Mild chronic small vessel ischemic disease.
3. Small right supraorbital hematoma and occipital scalp contusion.
No maxillofacial fracture.

## 2020-06-17 IMAGING — CT CT HEAD W/O CM
3 series · 15 of 47 positions shown, 18 images · non-contrast
Comparison: Head CT [DATE]

CLINICAL DATA: Fall 1 week ago. Injury to the right orbital region.

EXAM:
CT HEAD WITHOUT CONTRAST
CT MAXILLOFACIAL WITHOUT CONTRAST
TECHNIQUE: Multidetector CT imaging of the head and maxillofacial structures
were performed using the standard protocol without intravenous
contrast. Multiplanar CT image reconstructions of the maxillofacial
structures were also generated.

[Series 3: head w o · axial · 0.42mm/px · z∈[-13,+122]mm · 9 of 33 slices shown, 12 images]
[im 3/33  brain]
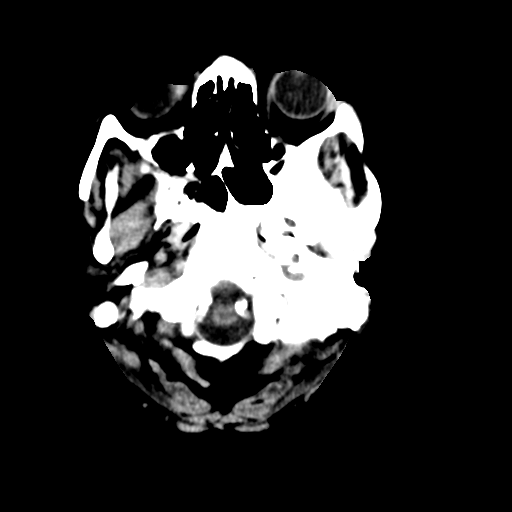
[im 3/33  bone]
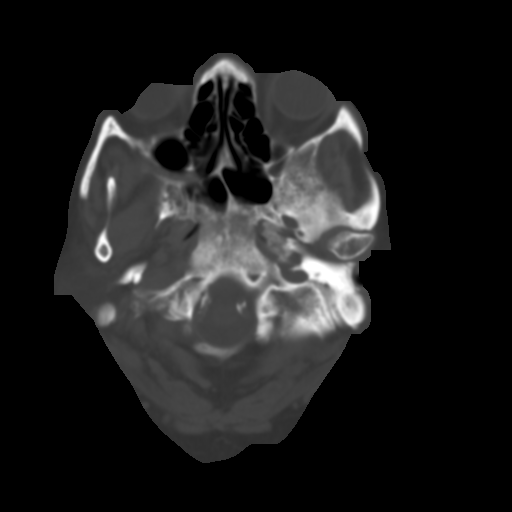
[im 6/33  brain]
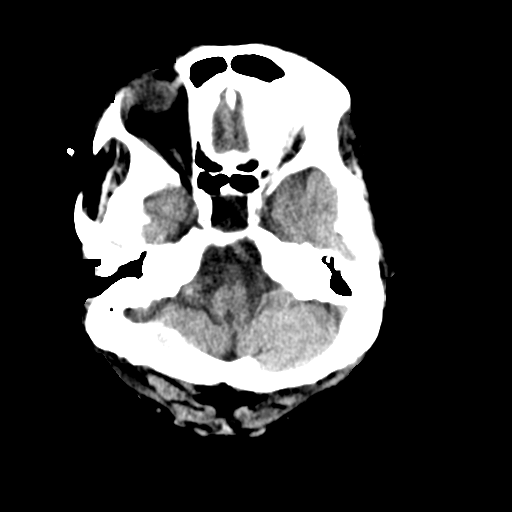
[im 9/33  brain]
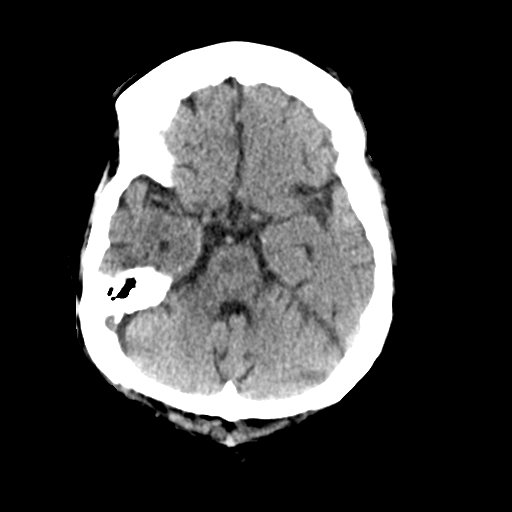
[im 13/33  brain]
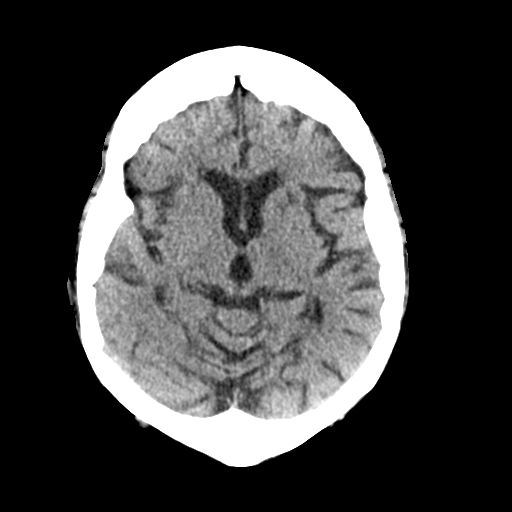
[im 17/33  brain]
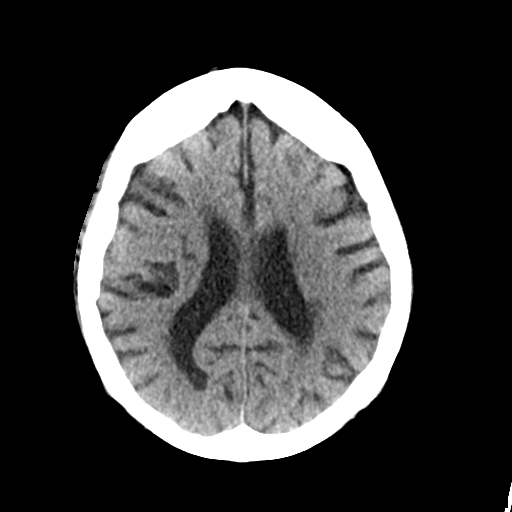
[im 17/33  bone]
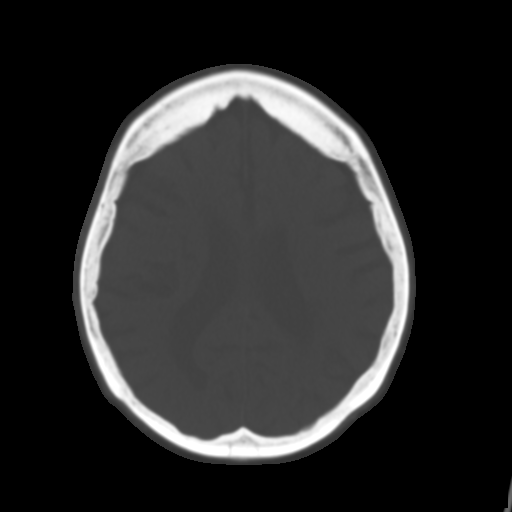
[im 20/33  brain]
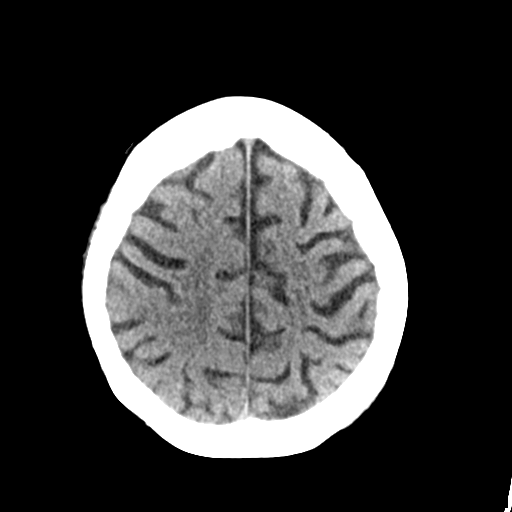
[im 24/33  brain]
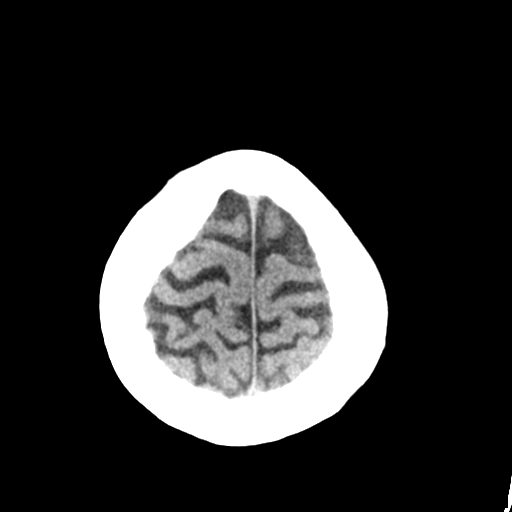
[im 27/33  brain]
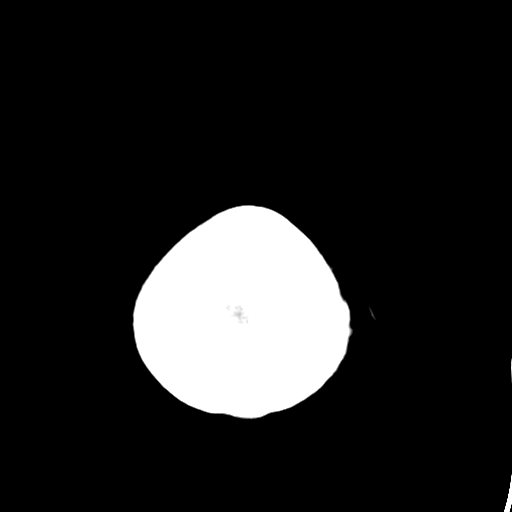
[im 30/33  brain]
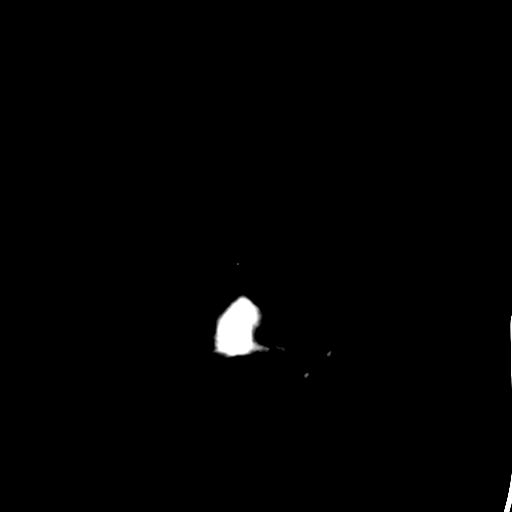
[im 30/33  bone]
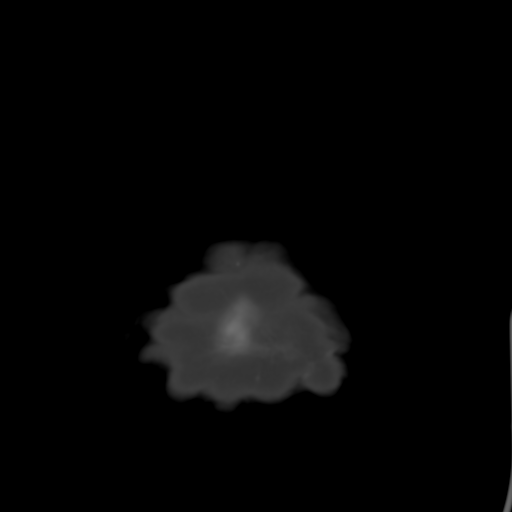

[Series 5: coronal soft · coronal · 0.31mm/px · 3 of 66 slices shown]
[im 22/66  brain]
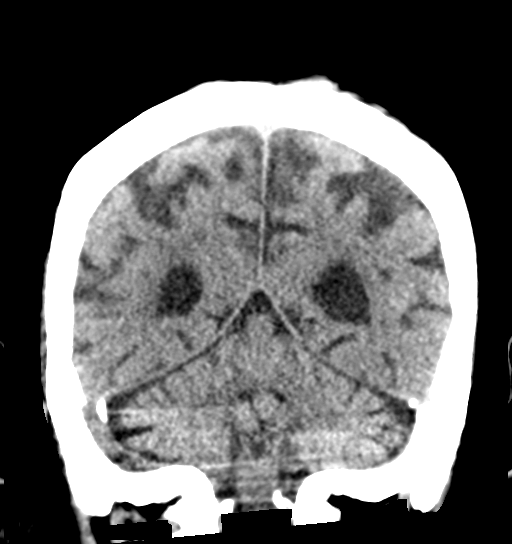
[im 29/66  brain]
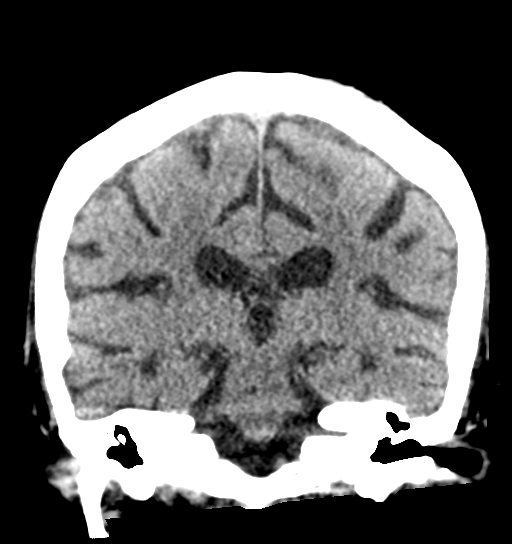
[im 37/66  brain]
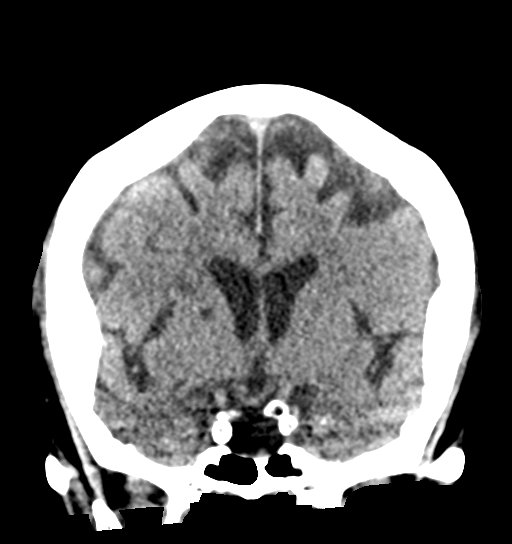

[Series 6: sagittal soft · sagittal · 0.34mm/px · 3 of 63 slices shown]
[im 21/63  brain]
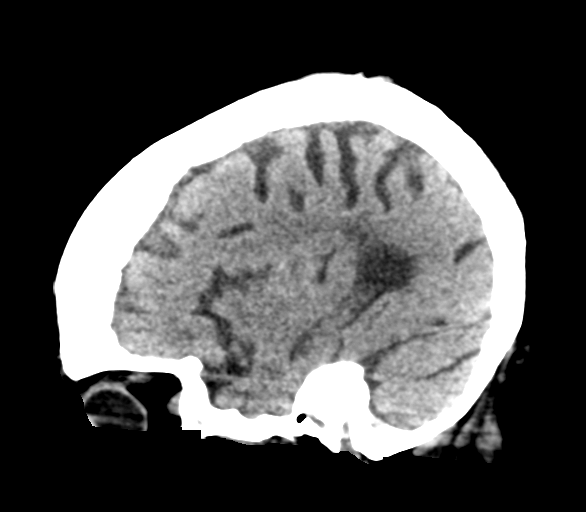
[im 32/63  brain]
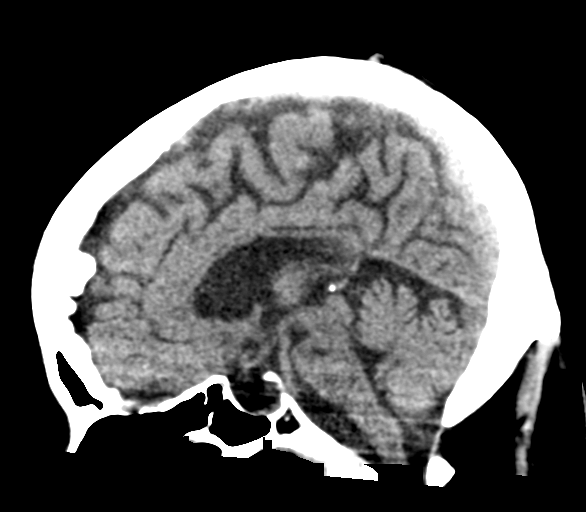
[im 42/63  brain]
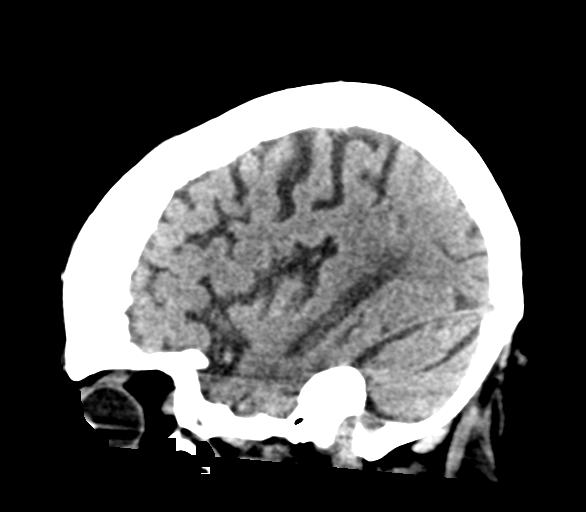

[15 of 47 positions shown; findings below may reference images not displayed]

FINDINGS: CT HEAD FINDINGS

Brain: There is no evidence of an acute infarct, intracranial
hemorrhage, mass, midline shift, or extra-axial fluid collection.
Hypodensities in the cerebral white matter bilaterally are unchanged
and nonspecific but compatible with mild chronic small vessel
ischemic disease. A chronic lacunar infarct is again seen in the
anterior limb of the right internal capsule. There is mild cerebral
atrophy. A partially empty sella is noted.

Vascular: Calcified atherosclerosis at the skull base. No hyperdense
vessel.

Skull: No fracture or suspicious osseous lesion.

Other: Small scalp contusion in the occipital region.

CT MAXILLOFACIAL FINDINGS

Osseous: No acute fracture, mandibular dislocation, or suspicious
osseous lesion. Edentulous. Mild upper cervical spondylosis.

Orbits: Grossly intact globes.  No retro bulbar hematoma or mass.

Sinuses: Paranasal sinuses and mastoid air cells are clear.

Soft tissues: Small right supraorbital hematoma on the right. Mild
carotid atherosclerosis.
IMPRESSION: 1. No evidence of acute intracranial abnormality.
2. Mild chronic small vessel ischemic disease.
3. Small right supraorbital hematoma and occipital scalp contusion.
No maxillofacial fracture.

## 2020-06-17 IMAGING — DX DG CHEST 1V PORT
1 series · 1 of 1 positions shown · non-contrast
Comparison: None.

CLINICAL DATA: Fall.

EXAM:
PORTABLE CHEST 1 VIEW

[chest ap grid]
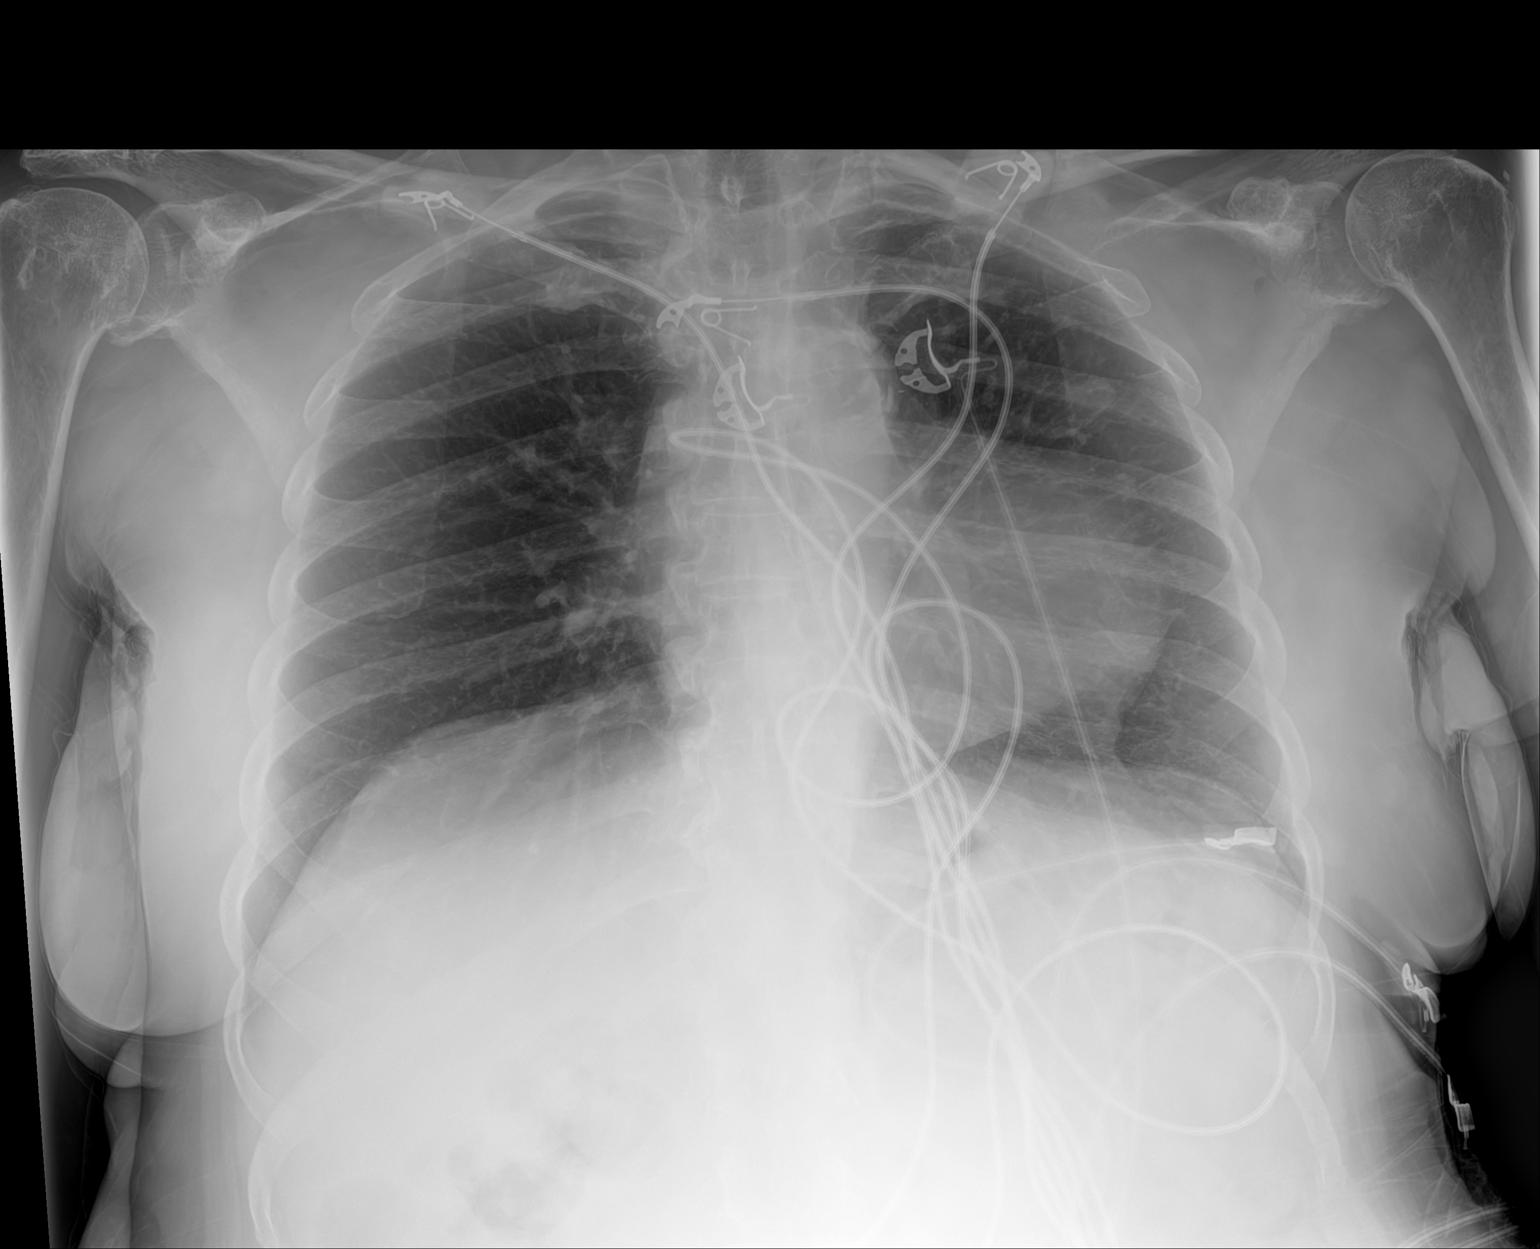

[1 of 1 positions shown; findings below may reference images not displayed]

FINDINGS: Portable AP view of the chest was obtained. Lungs are clear.
Negative for a pneumothorax. Atherosclerotic calcifications at the
aortic arch. Unusual configuration of the heart but most likely
associated with the technique and projection. No acute bone
abnormality. Degenerative changes at the left AC joint.
IMPRESSION: No acute chest abnormality.

## 2020-06-17 MED ORDER — SODIUM CHLORIDE 0.9 % IV BOLUS
1000.0000 mL | Freq: Once | INTRAVENOUS | Status: AC
  Administered 2020-06-17: 1000 mL via INTRAVENOUS

## 2020-06-17 NOTE — ED Notes (Signed)
Pt instructed not to get out of bed.  Pt says she is not trying to get out of bed.

## 2020-06-17 NOTE — ED Notes (Addendum)
Resting

## 2020-06-17 NOTE — Discharge Instructions (Addendum)
Please follow up with your primary care provider within 5-7 days for re-evaluation of your symptoms. If you do not have a primary care provider, information for a healthcare clinic has been provided for you to make arrangements for follow up care. Please return to the emergency department for any new or worsening symptoms. ° °

## 2020-06-17 NOTE — ED Notes (Signed)
Patient assisted to bathroom via wheelchair. Patient 1 person assist. Patient assisted back to bed.

## 2020-06-17 NOTE — ED Notes (Signed)
Pt found attempting to get OOB.  PA informed.

## 2020-06-17 NOTE — ED Triage Notes (Signed)
Pt from [facility]. Pt's staff reports pt falling "a few days ago"   Pt states "my hip gave out and I fell down" bruising and swelling to left eyebrow and eye. A/o x4
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
## 2020-09-25 ENCOUNTER — Emergency Department (HOSPITAL_COMMUNITY)
Admission: EM | Admit: 2020-09-25 | Discharge: 2020-09-26 | Disposition: A | Discharge status: Home / Self Care | Payer: Medicare Other | Attending: Emergency Medicine | Admitting: Emergency Medicine

## 2020-09-25 ENCOUNTER — Encounter (HOSPITAL_COMMUNITY): Payer: Self-pay

## 2020-09-25 ENCOUNTER — Other Ambulatory Visit: Payer: Self-pay

## 2020-09-25 DIAGNOSIS — E1140 Type 2 diabetes mellitus with diabetic neuropathy, unspecified: Secondary | ICD-10-CM | POA: Diagnosis not present

## 2020-09-25 DIAGNOSIS — W06XXXA Fall from bed, initial encounter: Secondary | ICD-10-CM | POA: Diagnosis not present

## 2020-09-25 DIAGNOSIS — S01111A Laceration without foreign body of right eyelid and periocular area, initial encounter: Secondary | ICD-10-CM | POA: Insufficient documentation

## 2020-09-25 DIAGNOSIS — W19XXXA Unspecified fall, initial encounter: Secondary | ICD-10-CM

## 2020-09-25 DIAGNOSIS — F0390 Unspecified dementia without behavioral disturbance: Secondary | ICD-10-CM | POA: Diagnosis not present

## 2020-09-25 DIAGNOSIS — I10 Essential (primary) hypertension: Secondary | ICD-10-CM | POA: Diagnosis not present

## 2020-09-25 DIAGNOSIS — Z79899 Other long term (current) drug therapy: Secondary | ICD-10-CM | POA: Diagnosis not present

## 2020-09-25 DIAGNOSIS — Z7984 Long term (current) use of oral hypoglycemic drugs: Secondary | ICD-10-CM | POA: Insufficient documentation

## 2020-09-25 DIAGNOSIS — Z7902 Long term (current) use of antithrombotics/antiplatelets: Secondary | ICD-10-CM | POA: Insufficient documentation

## 2020-09-25 DIAGNOSIS — Z794 Long term (current) use of insulin: Secondary | ICD-10-CM | POA: Diagnosis not present

## 2020-09-25 DIAGNOSIS — S0990XA Unspecified injury of head, initial encounter: Secondary | ICD-10-CM | POA: Diagnosis present

## 2020-09-25 DIAGNOSIS — Z8673 Personal history of transient ischemic attack (TIA), and cerebral infarction without residual deficits: Secondary | ICD-10-CM | POA: Insufficient documentation

## 2020-09-25 DIAGNOSIS — Z7982 Long term (current) use of aspirin: Secondary | ICD-10-CM | POA: Diagnosis not present

## 2020-09-25 NOTE — ED Triage Notes (Signed)
Pt brought in by [facility] for falling off 1 foot bed. Vitals normal per EMS. Pt has knot above right eye- no bleeding-skin intact- pain rated 2 of 10. No LOC. Pt from memory care unit.

## 2020-09-26 ENCOUNTER — Emergency Department (HOSPITAL_COMMUNITY): Payer: Medicare Other

## 2020-09-26 DIAGNOSIS — S01111A Laceration without foreign body of right eyelid and periocular area, initial encounter: Secondary | ICD-10-CM | POA: Diagnosis not present

## 2020-09-26 LAB — CBG MONITORING, ED: Glucose-Capillary: 116 mg/dL — ABNORMAL HIGH (ref 70–99)

## 2020-09-26 IMAGING — CT CT CERVICAL SPINE W/O CM
3 of 4 series · 12 of 33 positions shown, 14 images · non-contrast
Comparison: None.

CLINICAL DATA: Fall, head injury, facial swelling

EXAM:
CT HEAD WITHOUT CONTRAST
CT MAXILLOFACIAL WITHOUT CONTRAST
CT CERVICAL SPINE WITHOUT CONTRAST
TECHNIQUE: Multidetector CT imaging of the head, cervical spine, and
maxillofacial structures were performed using the standard protocol
without intravenous contrast. Multiplanar CT image reconstructions
of the cervical spine and maxillofacial structures were also
generated.

[Series 5: sag bone · sagittal · 0.23mm/px · 5 of 61 slices shown, 6 images]
[im 21/61  bone]
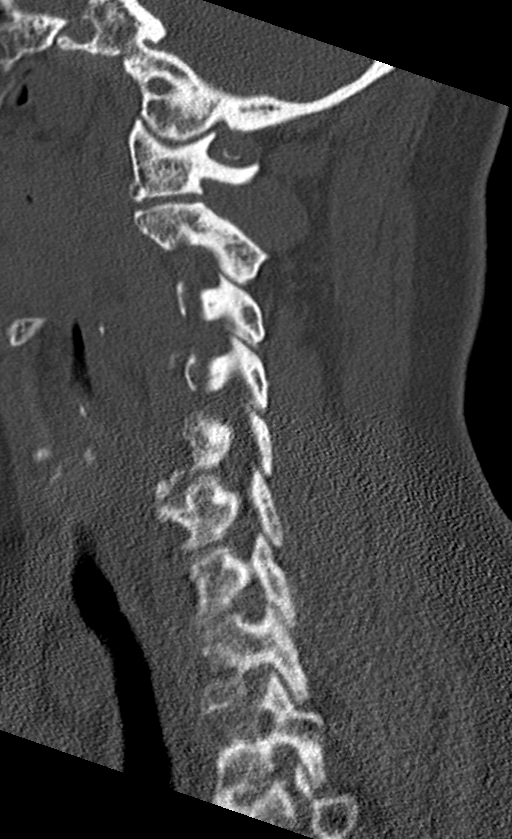
[im 26/61  bone]
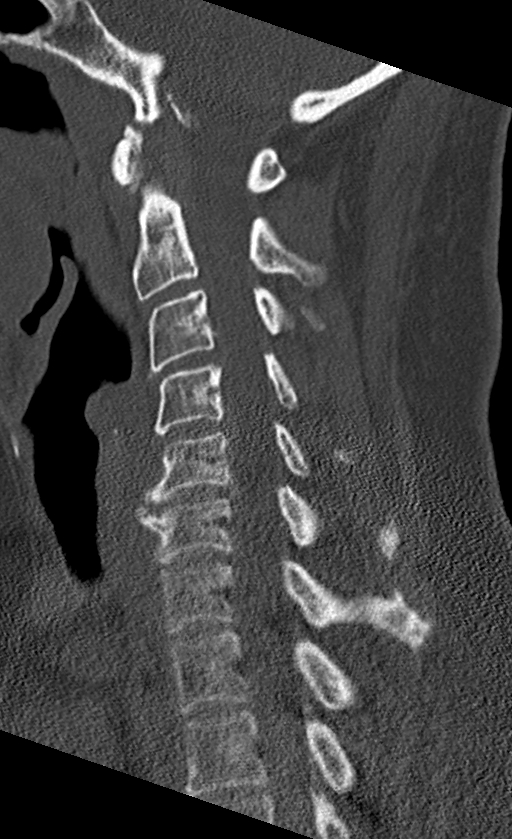
[im 31/61  soft-tissue]
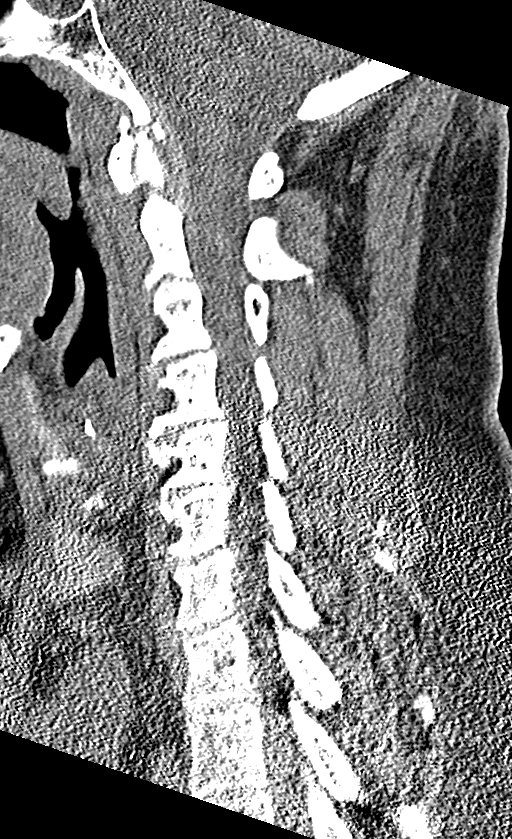
[im 31/61  bone]
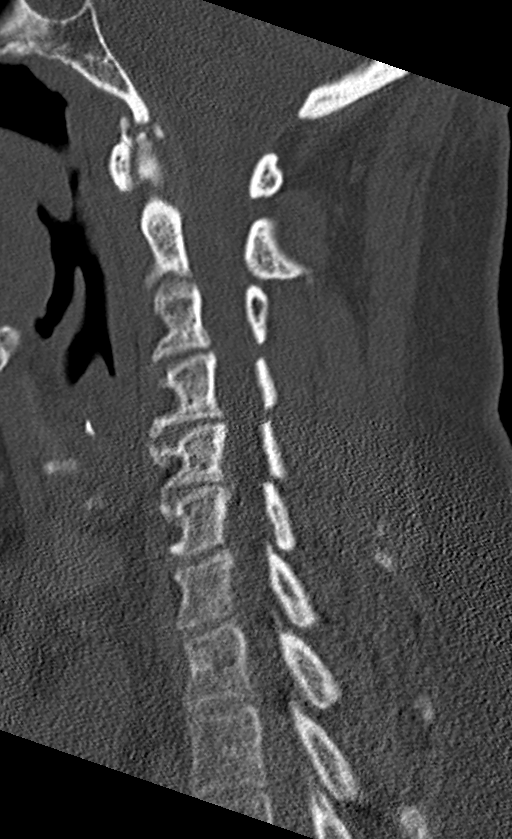
[im 36/61  bone]
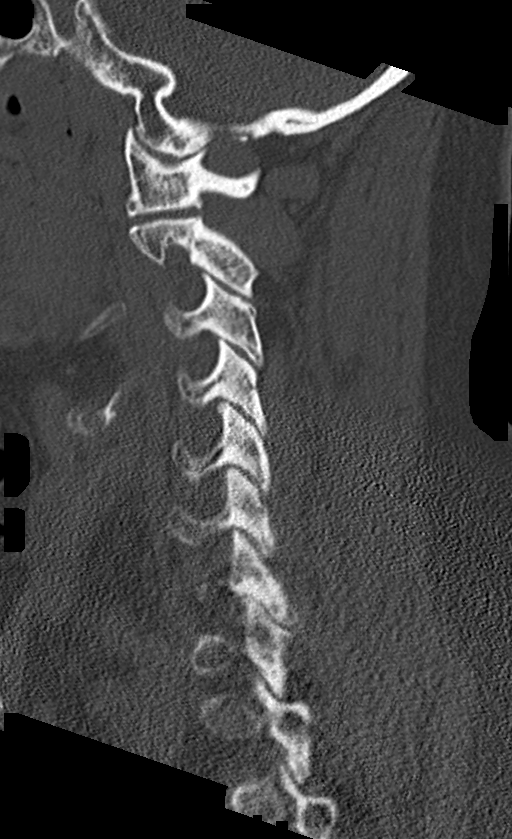
[im 41/61  bone]
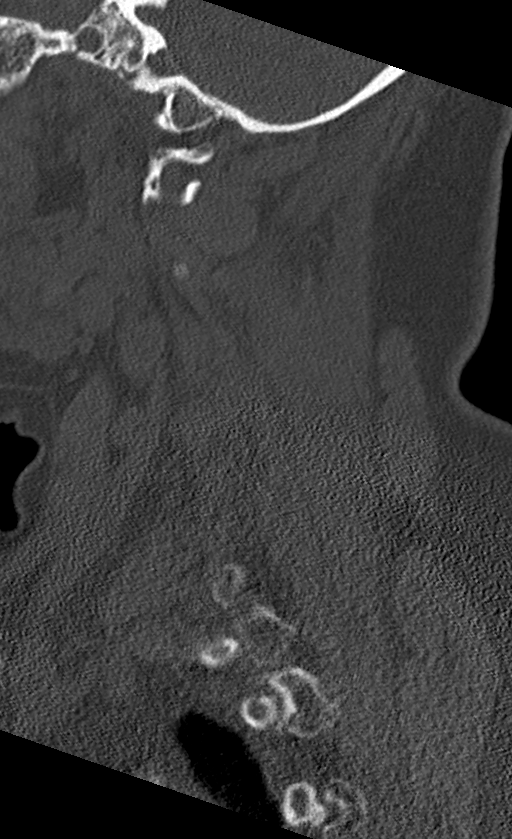

[Series 6: cor bone · coronal · 0.27mm/px · 3 of 59 slices shown]
[im 12/59  bone]
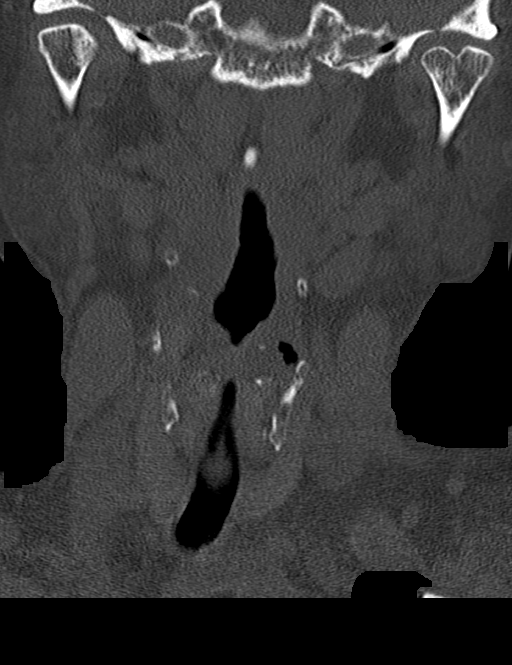
[im 24/59  bone]
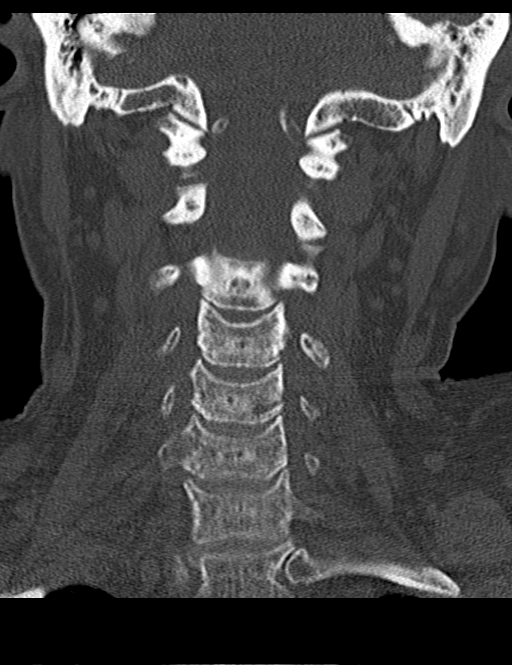
[im 35/59  bone]
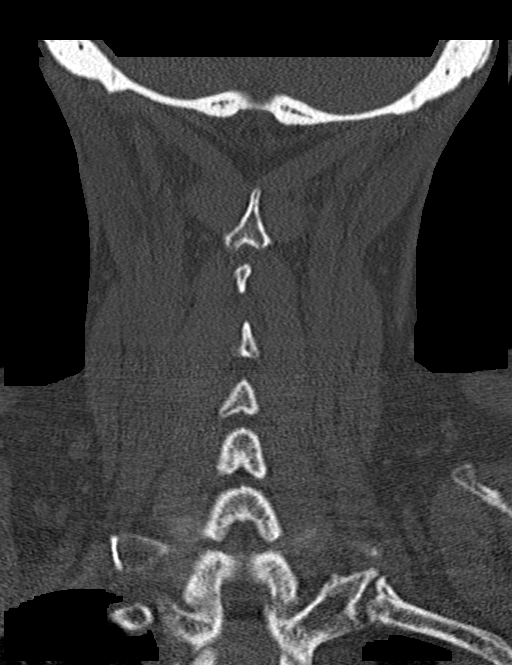

[Series 7: orthogonal axials · axial · 0.21mm/px · z∈[-188,-85]mm · 4 of 76 slices shown, 5 images]
[im 13/76  soft-tissue]
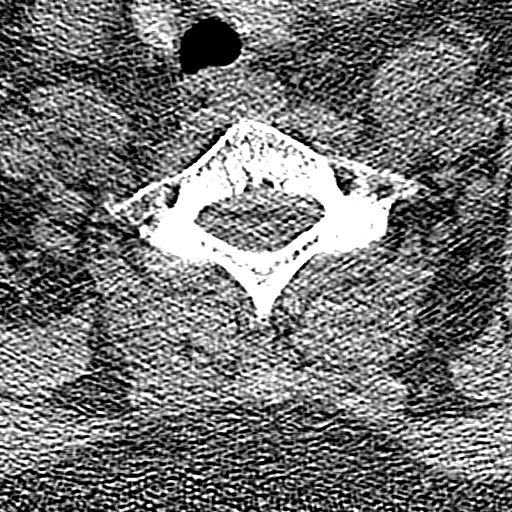
[im 13/76  bone]
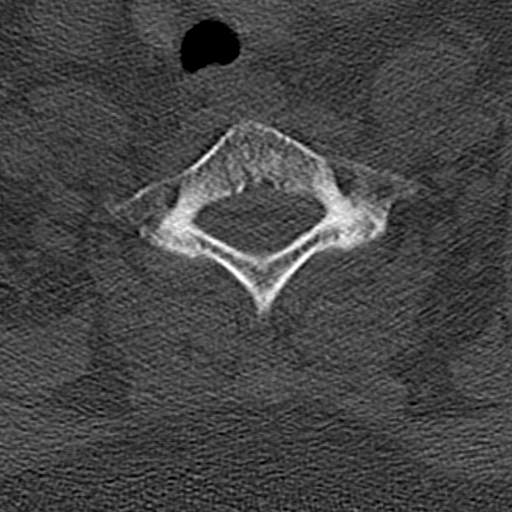
[im 26/76  bone]
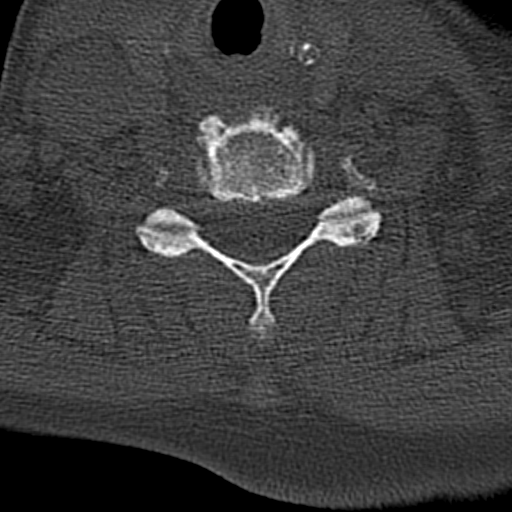
[im 51/76  bone]
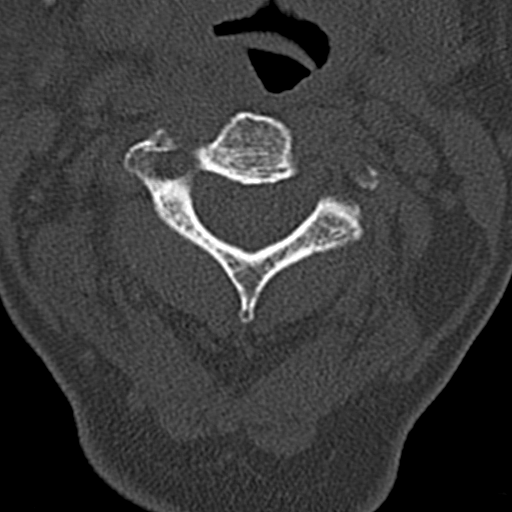
[im 63/76  bone]
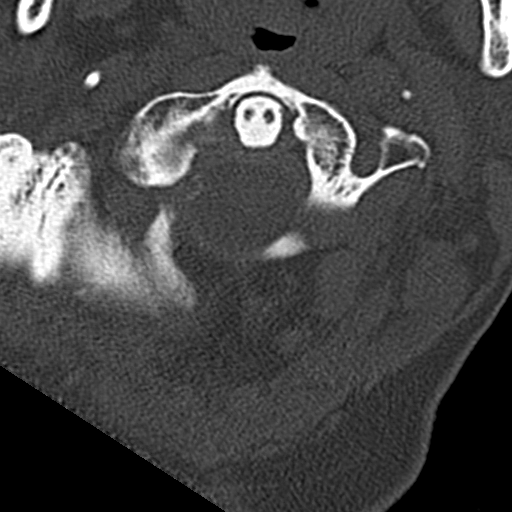

[12 of 33 positions shown; findings below may reference images not displayed]

FINDINGS: CT HEAD FINDINGS

Brain: Normal anatomic configuration. Parenchymal volume loss is
commensurate with the patient's age. Mild periventricular white
matter changes are present likely reflecting the sequela of small
vessel ischemia. Tiny remote lacunar infarct noted within the right
corona radiata. No abnormal intra or extra-axial mass lesion or
fluid collection. No abnormal mass effect or midline shift. No
evidence of acute intracranial hemorrhage or infarct. Ventricular
size is normal. Cerebellum unremarkable.

Vascular: No asymmetric hyperdense vasculature at the skull base.

Skull: Intact

Other: Mastoid air cells and middle ear cavities are clear. Moderate
soft tissue swelling is seen superficial to the right supraorbital
ridge.

CT MAXILLOFACIAL FINDINGS

Osseous: No acute facial fracture. No mandibular dislocation. The
patient is edentulous.

Orbits: Negative. No traumatic or inflammatory finding.

Sinuses: There is moderate mucosal thickening within the right
maxillary sinus. There is scattered areas of mucosal thickening
within the right ethmoid air cells. No air-fluid levels.

Soft tissues: Negative.

CT CERVICAL SPINE FINDINGS

Alignment: There is mild reversal of the normal cervical lordosis.
No listhesis.

Skull base and vertebrae: The craniocervical alignment is normal.
The atlantodental interval is not widened. No acute fracture of the
cervical spine.

Soft tissues and spinal canal: No prevertebral fluid or swelling. No
visible canal hematoma. Small broad-based disc bulge at C3-4 results
in mild central canal stenosis with abutment and flattening of the
thecal sac. Similar changes are noted at C4-5, C5-6, and, to a
lesser extent, C6-7.

Disc levels: There is intervertebral disc space narrowing and
endplate remodeling at C3-C6, most severe at C5-6, in keeping with
changes of mild to moderate degenerative disc disease. Sagittal
reformats demonstrate no thickening of the prevertebral soft
tissues. Review of the axial images demonstrates mild uncovertebral
arthrosis at C5-6. No significant neuroforaminal narrowing. No
significant facet arthrosis.,

Upper chest: Unremarkable

Other: None
IMPRESSION: No acute intracranial injury.  No calvarial fracture.

Moderate right supraorbital superficial soft tissue swelling.

No acute facial fracture.

No acute fracture or listhesis of the cervical spine.

## 2020-09-26 IMAGING — DX DG PELVIS 1-2V
1 series · 1 of 1 positions shown · non-contrast
Comparison: None.

CLINICAL DATA: Fall

EXAM:
PELVIS - 1-2 VIEW

[pelvis ap]
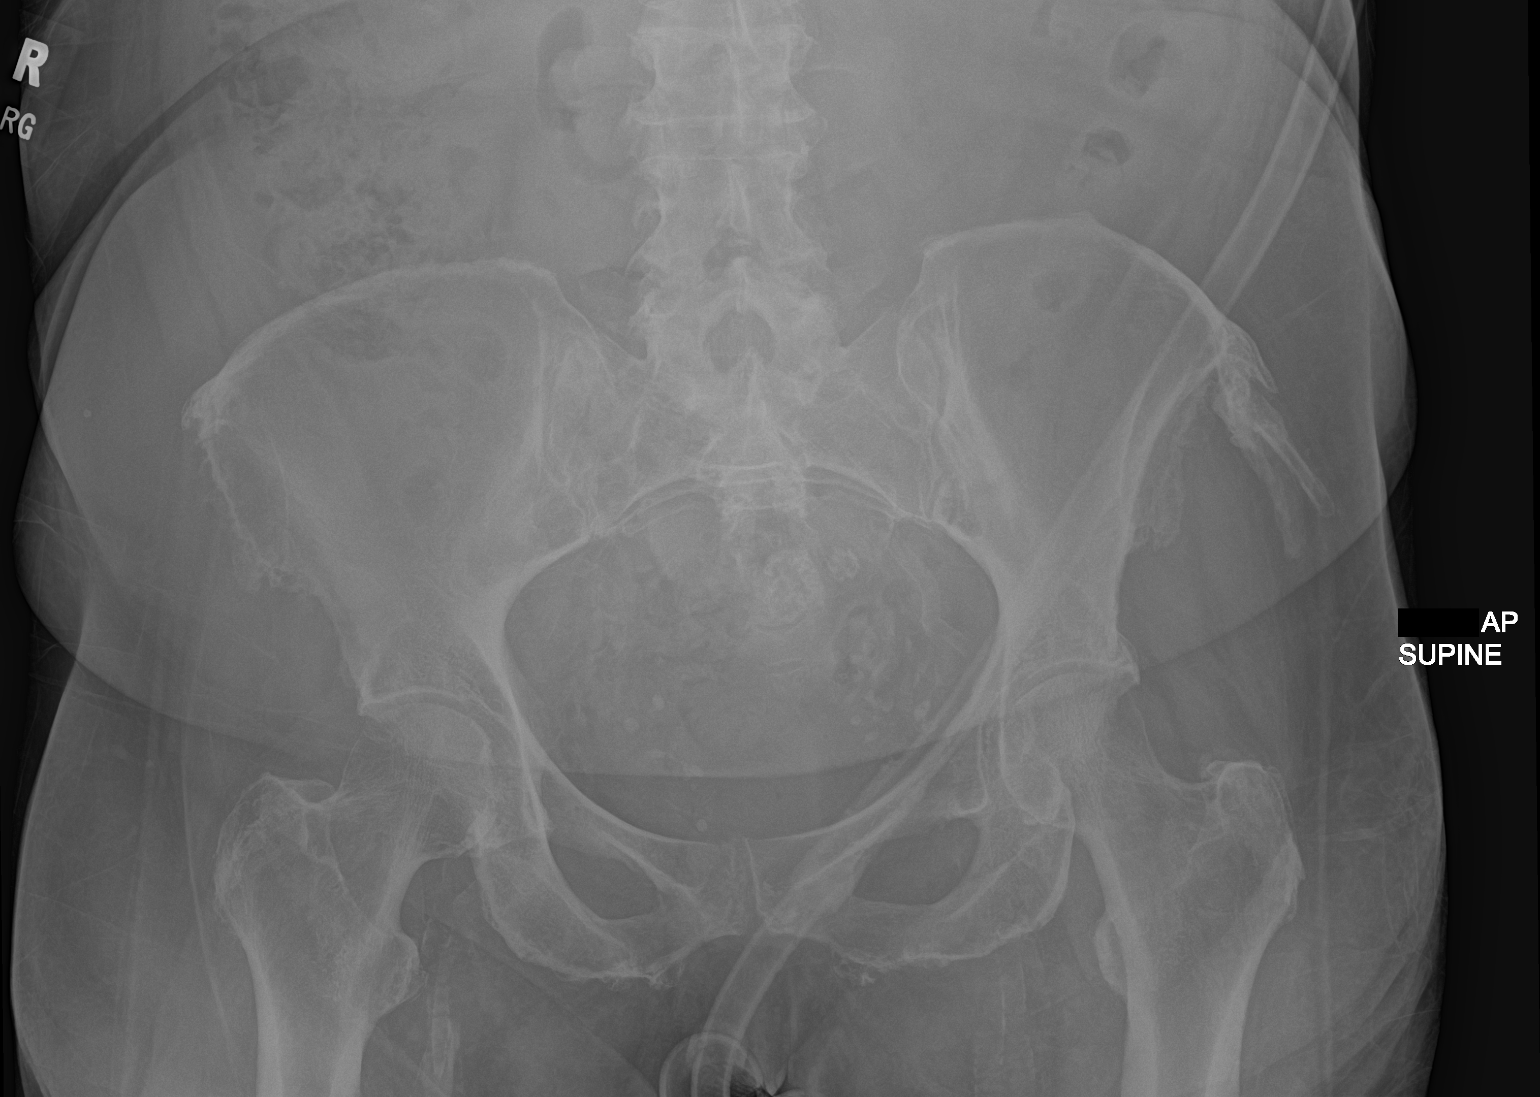

[1 of 1 positions shown; findings below may reference images not displayed]

FINDINGS: No fracture or dislocation is seen.

Bilateral hip joint spaces are preserved.

Visualized bony pelvis appears intact.

Mild degenerative changes of the lower lumbar spine.
IMPRESSION: Negative.

## 2020-09-26 IMAGING — DX DG CHEST 1V PORT
1 series · 1 of 1 positions shown · non-contrast
Comparison: [DATE]

CLINICAL DATA: Fall

EXAM:
PORTABLE CHEST 1 VIEW

[chest ap]
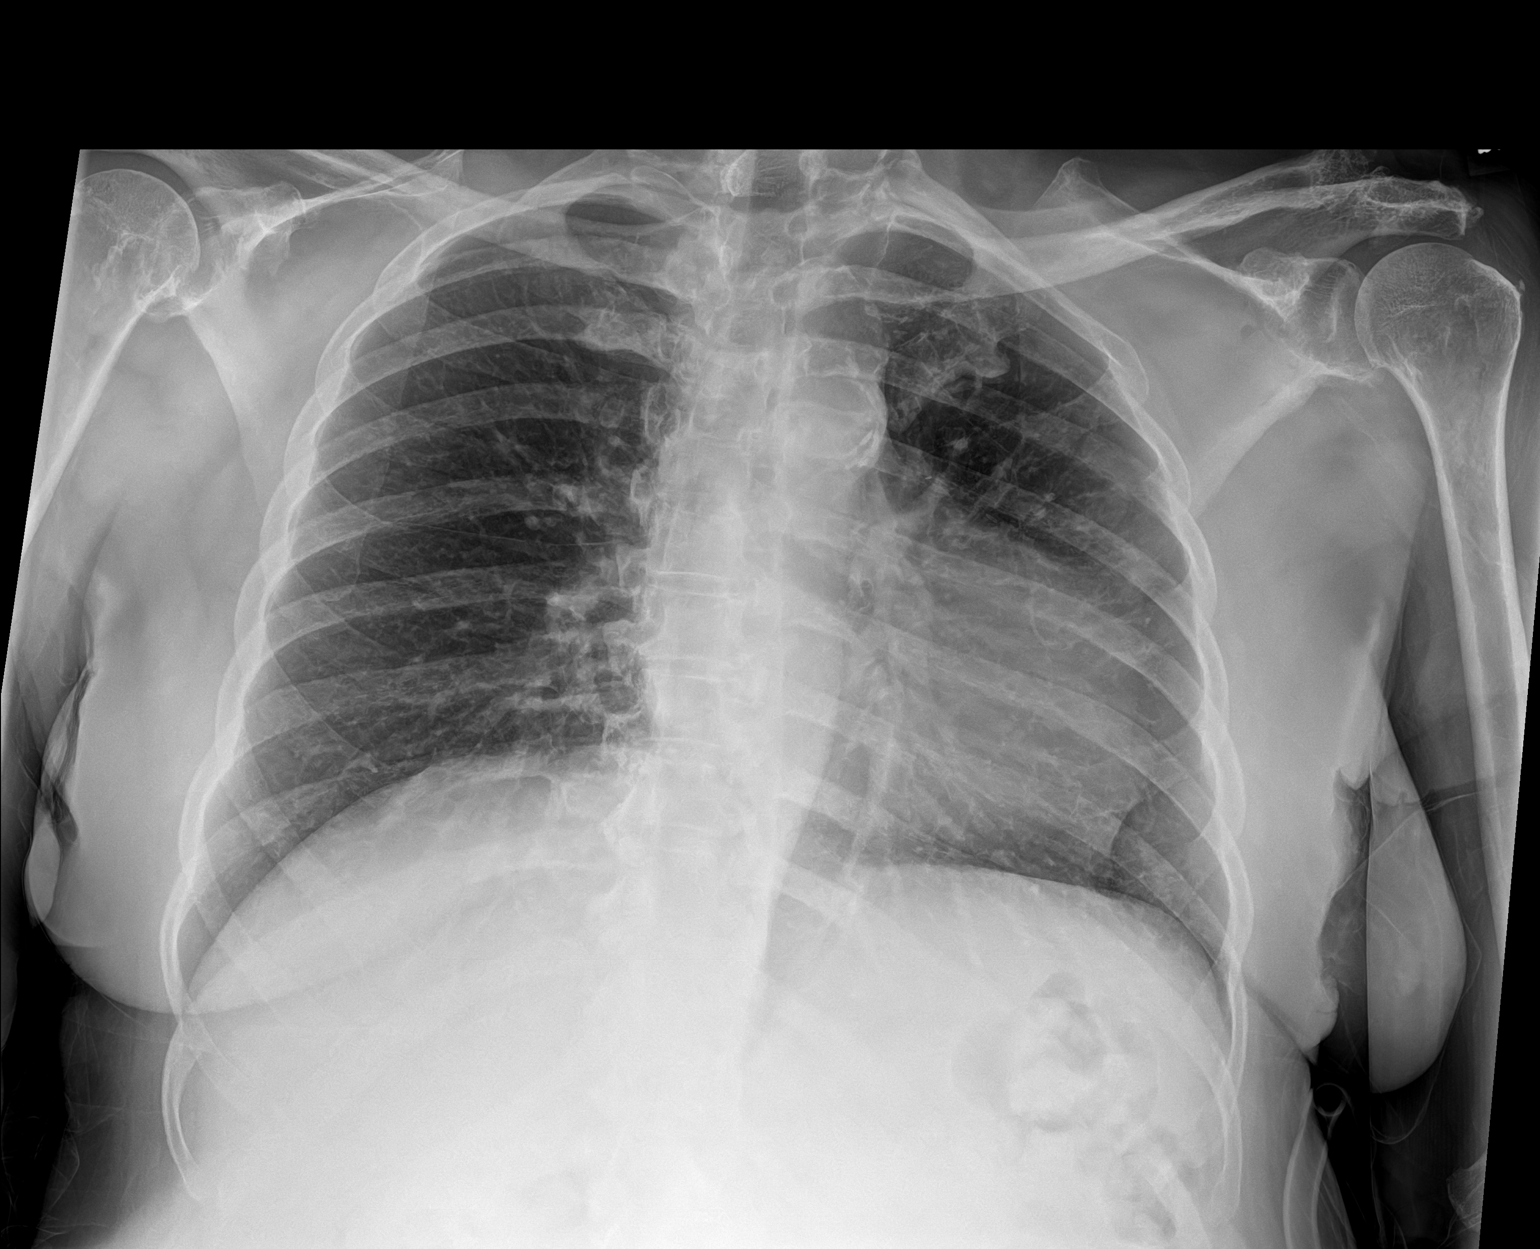

[1 of 1 positions shown; findings below may reference images not displayed]

FINDINGS: Lungs are clear.  No pleural effusion or pneumothorax.

The heart is normal in size.

Thoracic aortic atherosclerosis.
IMPRESSION: No evidence of acute cardiopulmonary disease.

## 2020-09-26 IMAGING — CT CT MAXILLOFACIAL W/O CM
3 series · 14 of 47 positions shown, 16 images · non-contrast
Comparison: None.

CLINICAL DATA: Fall, head injury, facial swelling

EXAM:
CT HEAD WITHOUT CONTRAST
CT MAXILLOFACIAL WITHOUT CONTRAST
CT CERVICAL SPINE WITHOUT CONTRAST
TECHNIQUE: Multidetector CT imaging of the head, cervical spine, and
maxillofacial structures were performed using the standard protocol
without intravenous contrast. Multiplanar CT image reconstructions
of the cervical spine and maxillofacial structures were also
generated.

[Series 2: max soft · axial · 0.33mm/px · z∈[-162,-26]mm · 8 of 80 slices shown, 10 images]
[im 6/80  brain]
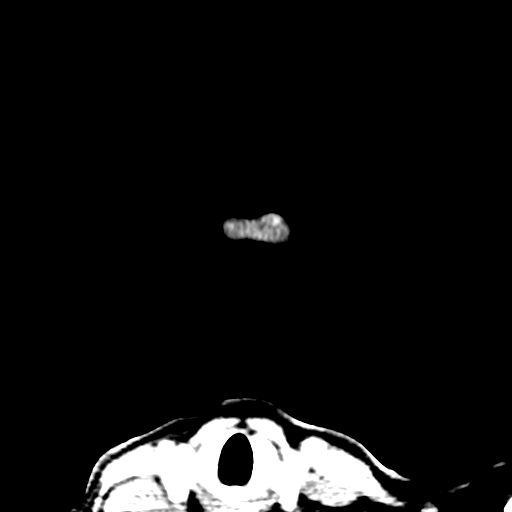
[im 6/80  bone]
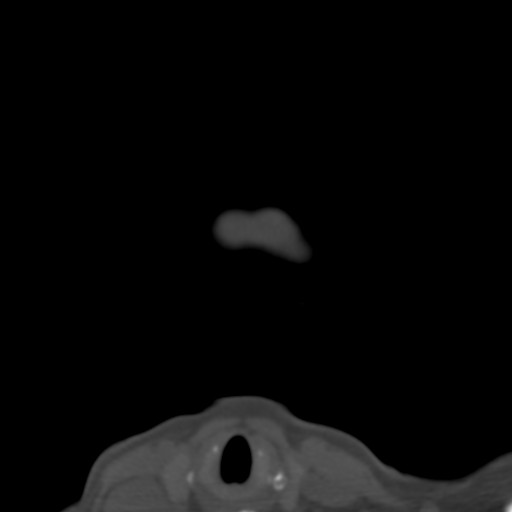
[im 17/80  bone]
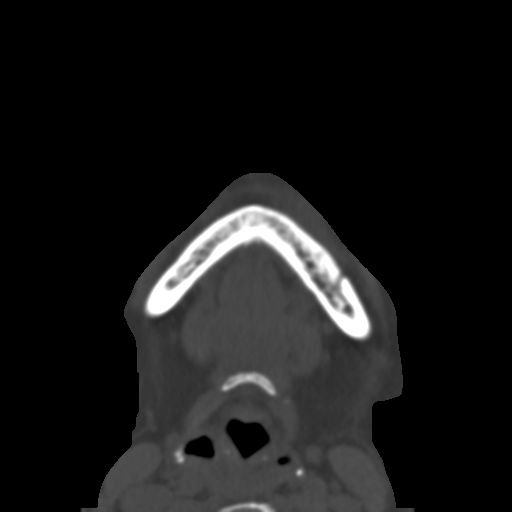
[im 25/80  bone]
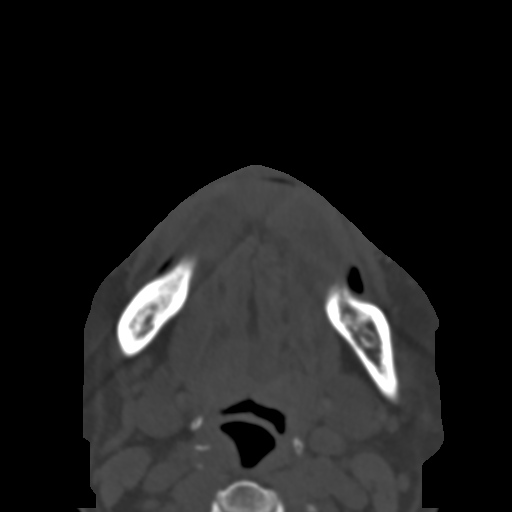
[im 36/80  bone]
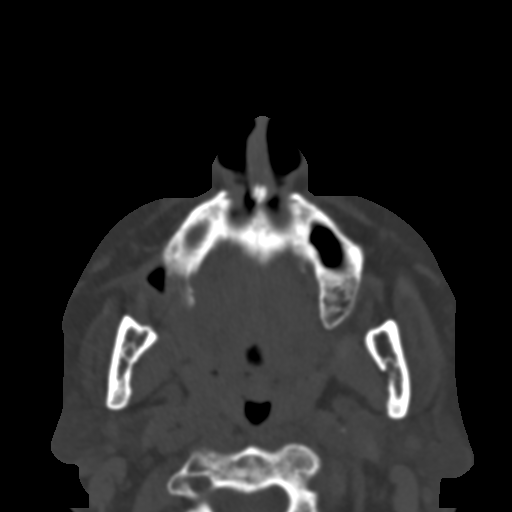
[im 44/80  brain]
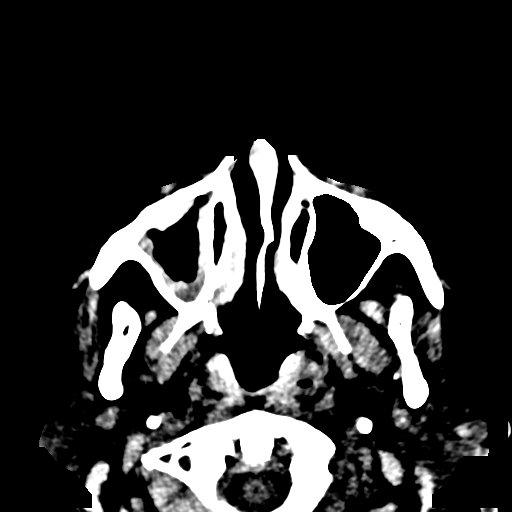
[im 44/80  bone]
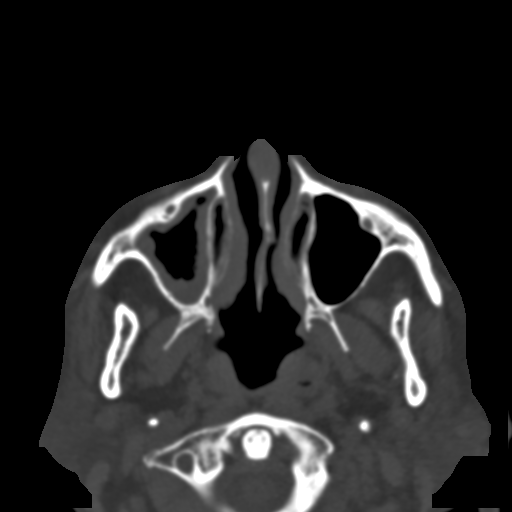
[im 55/80  bone]
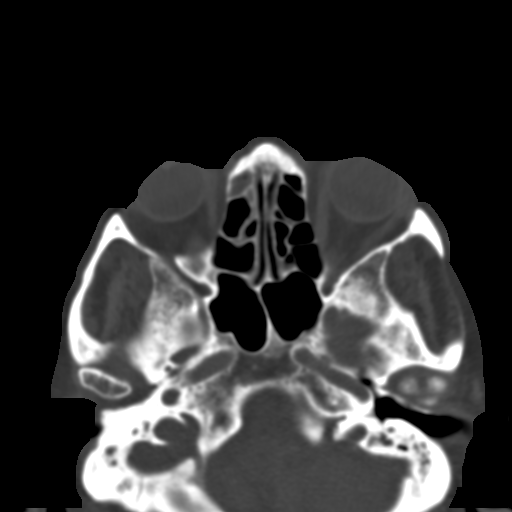
[im 63/80  bone]
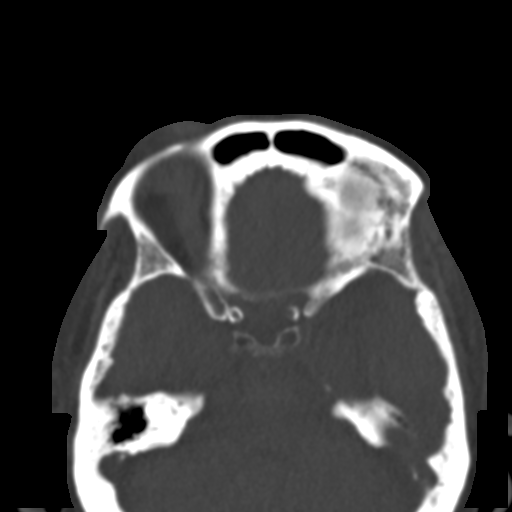
[im 74/80  bone]
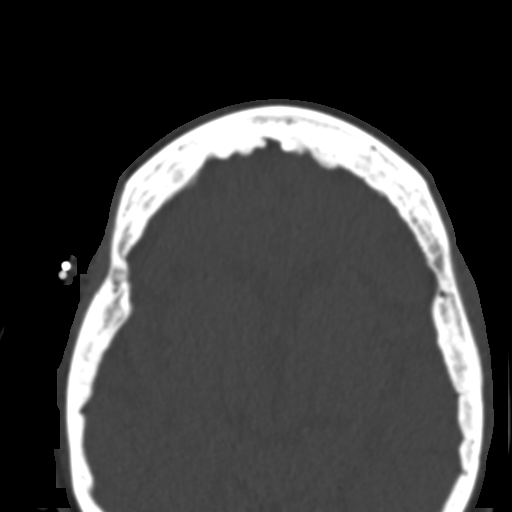

[Series 6: coronal soft · coronal · 0.33mm/px · 3 of 85 slices shown]
[im 38/85  bone]
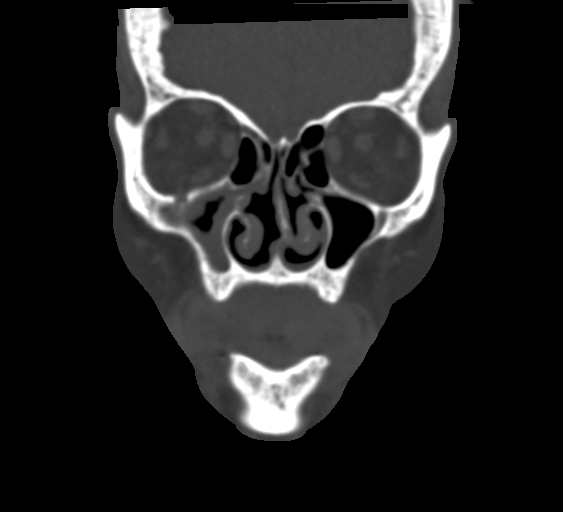
[im 47/85  bone]
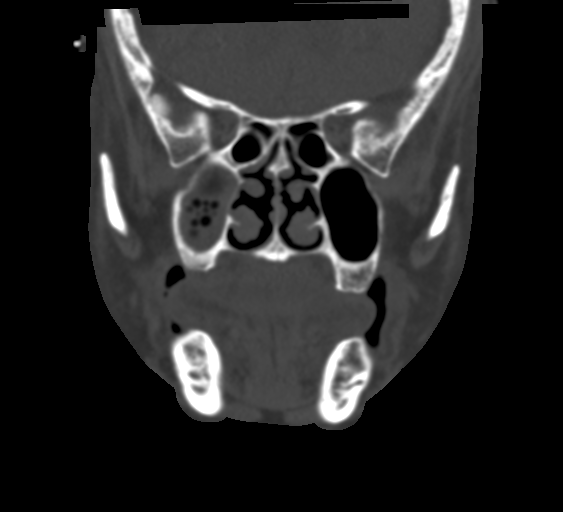
[im 57/85  bone]
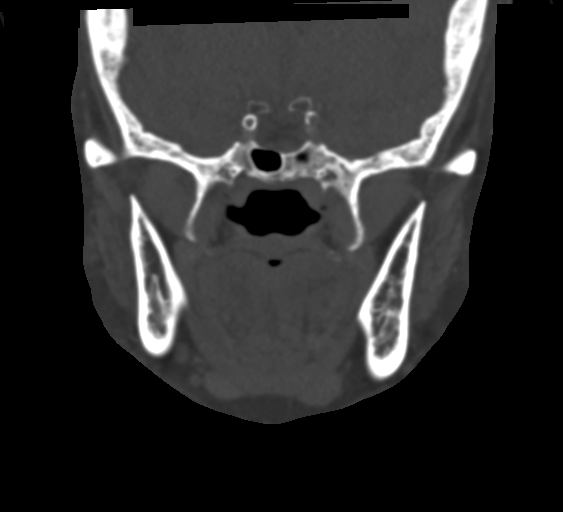

[Series 7: sagittal soft · sagittal · 0.34mm/px · 3 of 89 slices shown]
[im 30/89  bone]
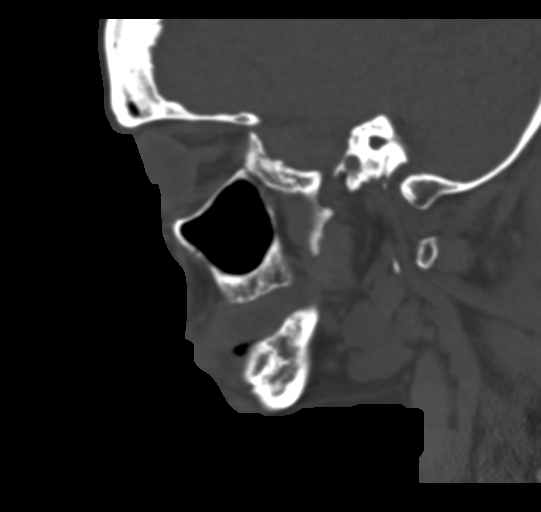
[im 45/89  bone]
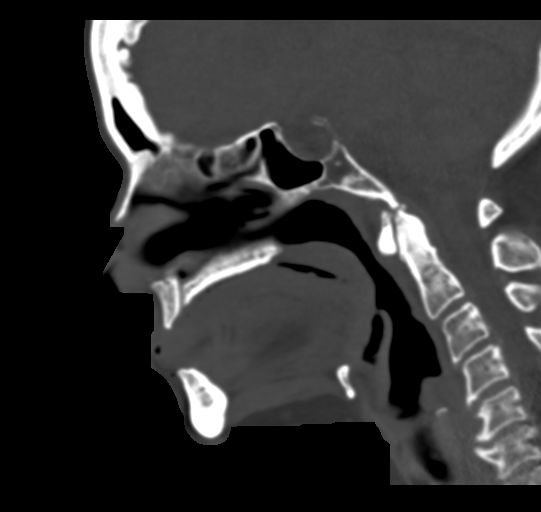
[im 59/89  bone]
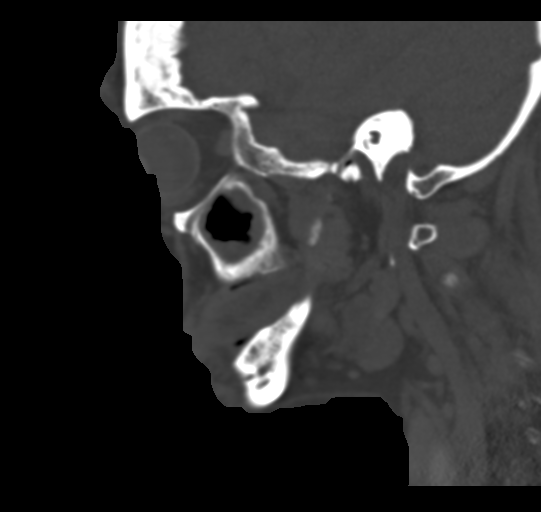

[14 of 47 positions shown; findings below may reference images not displayed]

FINDINGS: CT HEAD FINDINGS

Brain: Normal anatomic configuration. Parenchymal volume loss is
commensurate with the patient's age. Mild periventricular white
matter changes are present likely reflecting the sequela of small
vessel ischemia. Tiny remote lacunar infarct noted within the right
corona radiata. No abnormal intra or extra-axial mass lesion or
fluid collection. No abnormal mass effect or midline shift. No
evidence of acute intracranial hemorrhage or infarct. Ventricular
size is normal. Cerebellum unremarkable.

Vascular: No asymmetric hyperdense vasculature at the skull base.

Skull: Intact

Other: Mastoid air cells and middle ear cavities are clear. Moderate
soft tissue swelling is seen superficial to the right supraorbital
ridge.

CT MAXILLOFACIAL FINDINGS

Osseous: No acute facial fracture. No mandibular dislocation. The
patient is edentulous.

Orbits: Negative. No traumatic or inflammatory finding.

Sinuses: There is moderate mucosal thickening within the right
maxillary sinus. There is scattered areas of mucosal thickening
within the right ethmoid air cells. No air-fluid levels.

Soft tissues: Negative.

CT CERVICAL SPINE FINDINGS

Alignment: There is mild reversal of the normal cervical lordosis.
No listhesis.

Skull base and vertebrae: The craniocervical alignment is normal.
The atlantodental interval is not widened. No acute fracture of the
cervical spine.

Soft tissues and spinal canal: No prevertebral fluid or swelling. No
visible canal hematoma. Small broad-based disc bulge at C3-4 results
in mild central canal stenosis with abutment and flattening of the
thecal sac. Similar changes are noted at C4-5, C5-6, and, to a
lesser extent, C6-7.

Disc levels: There is intervertebral disc space narrowing and
endplate remodeling at C3-C6, most severe at C5-6, in keeping with
changes of mild to moderate degenerative disc disease. Sagittal
reformats demonstrate no thickening of the prevertebral soft
tissues. Review of the axial images demonstrates mild uncovertebral
arthrosis at C5-6. No significant neuroforaminal narrowing. No
significant facet arthrosis.,

Upper chest: Unremarkable

Other: None
IMPRESSION: No acute intracranial injury.  No calvarial fracture.

Moderate right supraorbital superficial soft tissue swelling.

No acute facial fracture.

No acute fracture or listhesis of the cervical spine.

## 2020-09-26 IMAGING — CT CT HEAD W/O CM
3 series · 14 of 46 positions shown, 16 images · non-contrast
Comparison: None.

CLINICAL DATA: Fall, head injury, facial swelling

EXAM:
CT HEAD WITHOUT CONTRAST
CT MAXILLOFACIAL WITHOUT CONTRAST
CT CERVICAL SPINE WITHOUT CONTRAST
TECHNIQUE: Multidetector CT imaging of the head, cervical spine, and
maxillofacial structures were performed using the standard protocol
without intravenous contrast. Multiplanar CT image reconstructions
of the cervical spine and maxillofacial structures were also
generated.

[Series 2: head w o · axial · 0.41mm/px · z∈[-65,+55]mm · 8 of 29 slices shown, 10 images]
[im 3/29  brain]
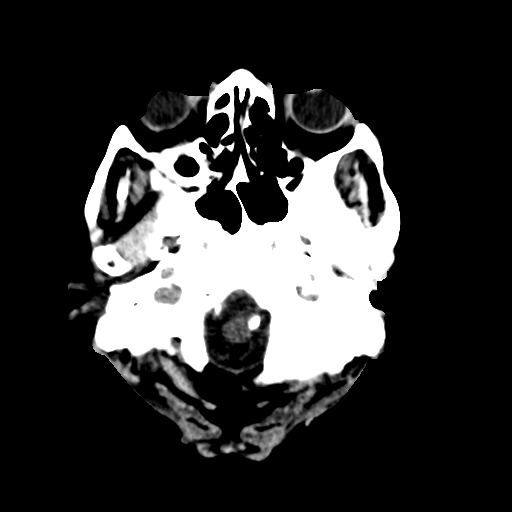
[im 3/29  bone]
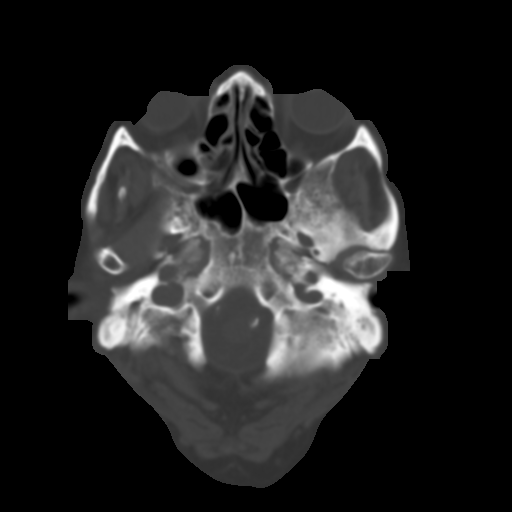
[im 7/29  brain]
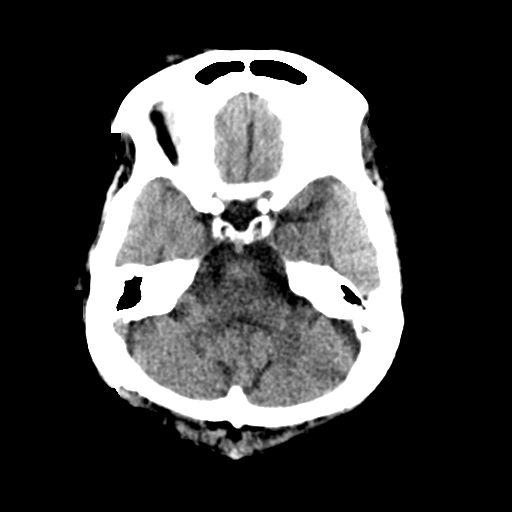
[im 10/29  brain]
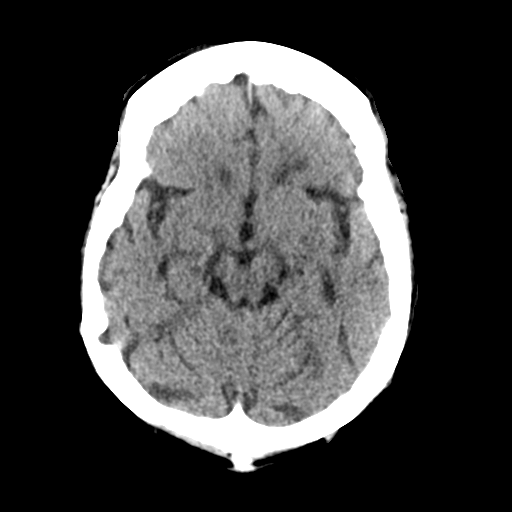
[im 13/29  brain]
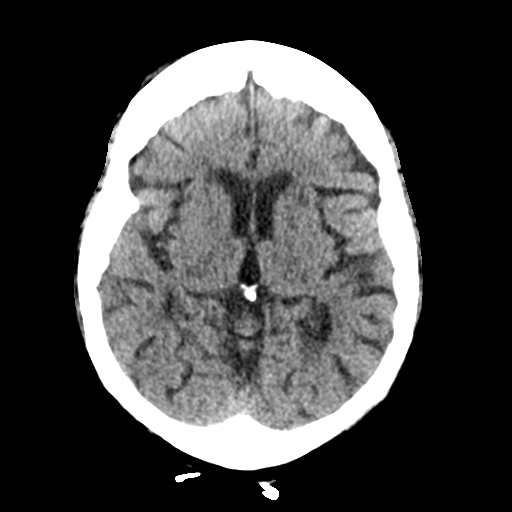
[im 17/29  brain]
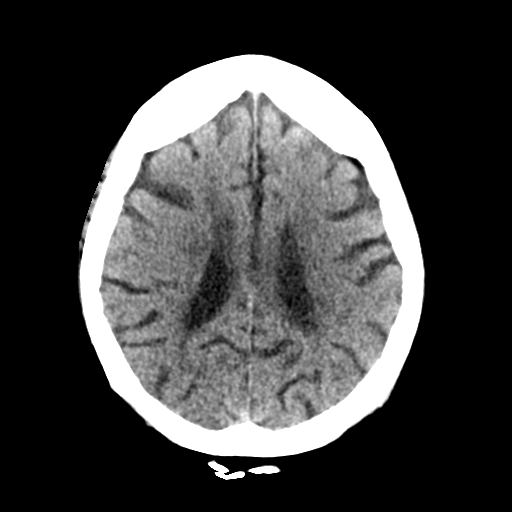
[im 17/29  bone]
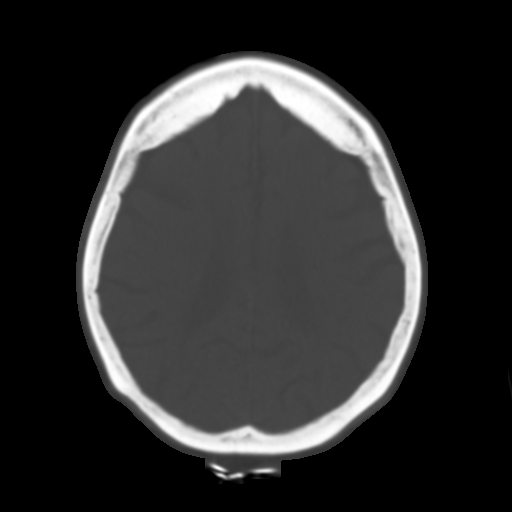
[im 20/29  brain]
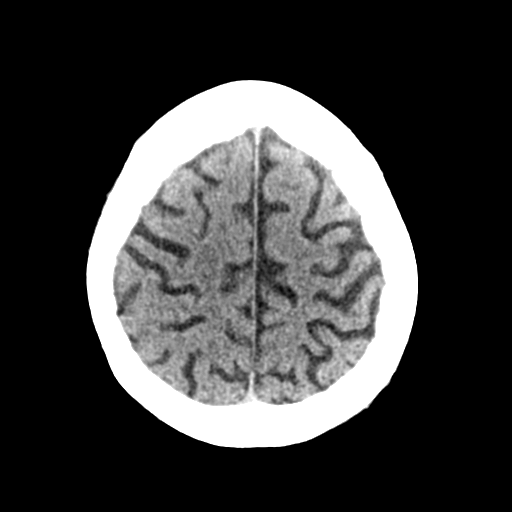
[im 23/29  brain]
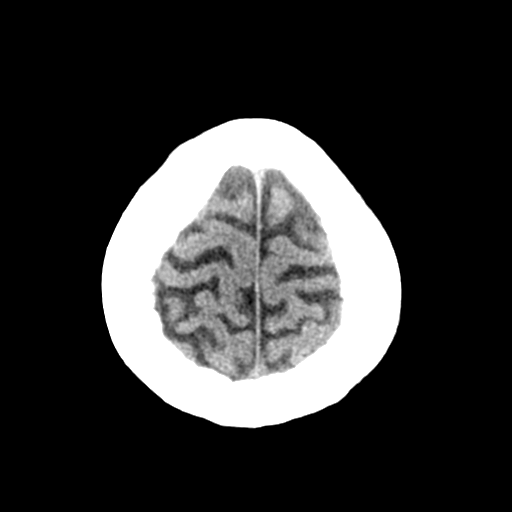
[im 27/29  brain]
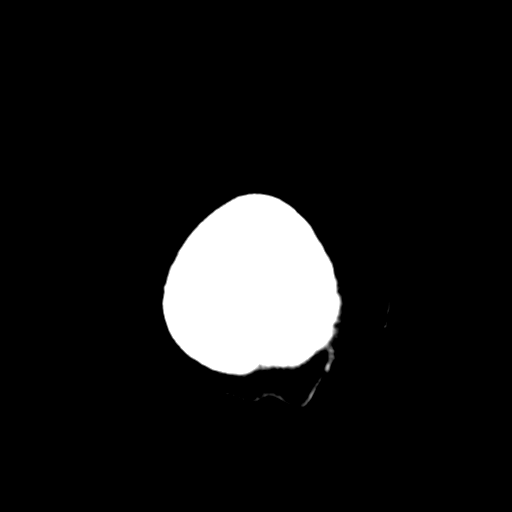

[Series 4: coronal soft · coronal · 0.32mm/px · 3 of 65 slices shown]
[im 22/65  brain]
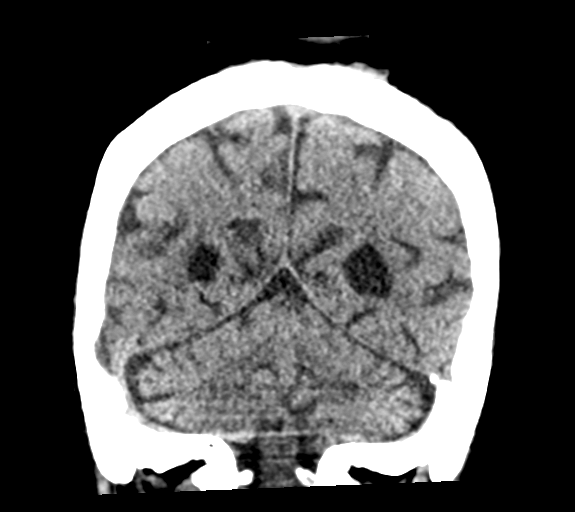
[im 29/65  brain]
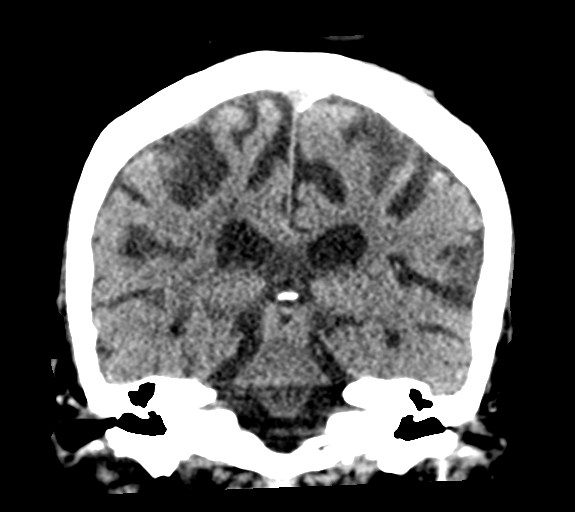
[im 36/65  brain]
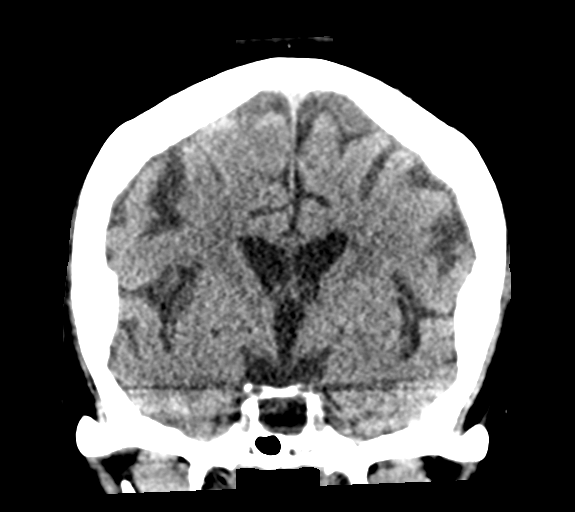

[Series 5: sagittal soft · sagittal · 0.31mm/px · 3 of 61 slices shown]
[im 21/61  brain]
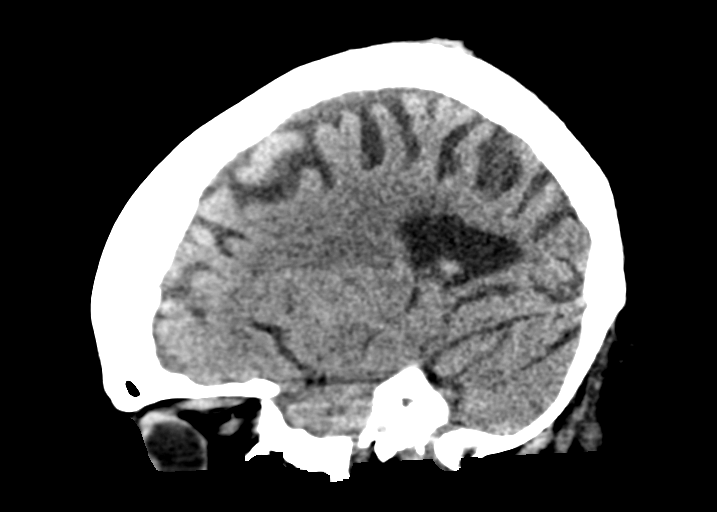
[im 31/61  brain]
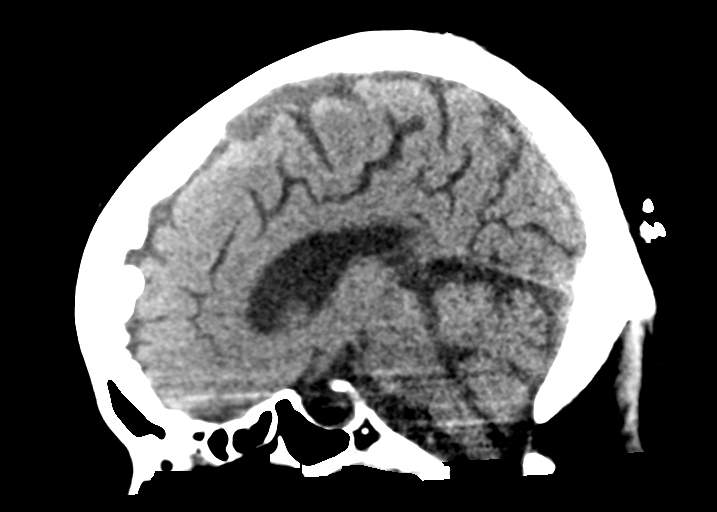
[im 41/61  brain]
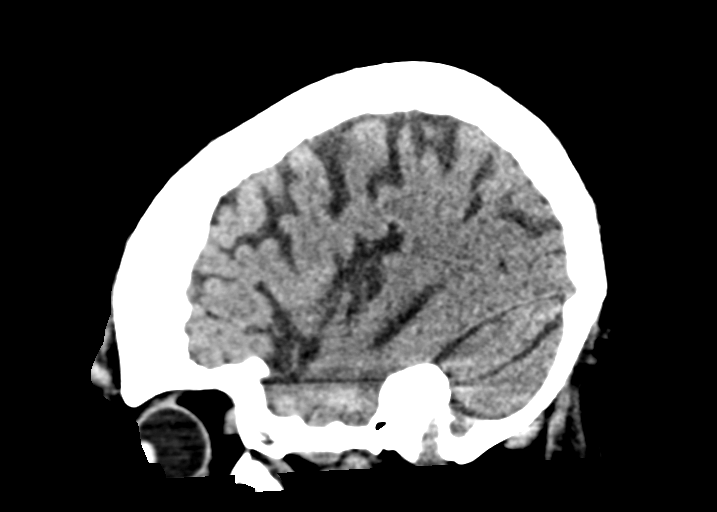

[14 of 46 positions shown; findings below may reference images not displayed]

FINDINGS: CT HEAD FINDINGS

Brain: Normal anatomic configuration. Parenchymal volume loss is
commensurate with the patient's age. Mild periventricular white
matter changes are present likely reflecting the sequela of small
vessel ischemia. Tiny remote lacunar infarct noted within the right
corona radiata. No abnormal intra or extra-axial mass lesion or
fluid collection. No abnormal mass effect or midline shift. No
evidence of acute intracranial hemorrhage or infarct. Ventricular
size is normal. Cerebellum unremarkable.

Vascular: No asymmetric hyperdense vasculature at the skull base.

Skull: Intact

Other: Mastoid air cells and middle ear cavities are clear. Moderate
soft tissue swelling is seen superficial to the right supraorbital
ridge.

CT MAXILLOFACIAL FINDINGS

Osseous: No acute facial fracture. No mandibular dislocation. The
patient is edentulous.

Orbits: Negative. No traumatic or inflammatory finding.

Sinuses: There is moderate mucosal thickening within the right
maxillary sinus. There is scattered areas of mucosal thickening
within the right ethmoid air cells. No air-fluid levels.

Soft tissues: Negative.

CT CERVICAL SPINE FINDINGS

Alignment: There is mild reversal of the normal cervical lordosis.
No listhesis.

Skull base and vertebrae: The craniocervical alignment is normal.
The atlantodental interval is not widened. No acute fracture of the
cervical spine.

Soft tissues and spinal canal: No prevertebral fluid or swelling. No
visible canal hematoma. Small broad-based disc bulge at C3-4 results
in mild central canal stenosis with abutment and flattening of the
thecal sac. Similar changes are noted at C4-5, C5-6, and, to a
lesser extent, C6-7.

Disc levels: There is intervertebral disc space narrowing and
endplate remodeling at C3-C6, most severe at C5-6, in keeping with
changes of mild to moderate degenerative disc disease. Sagittal
reformats demonstrate no thickening of the prevertebral soft
tissues. Review of the axial images demonstrates mild uncovertebral
arthrosis at C5-6. No significant neuroforaminal narrowing. No
significant facet arthrosis.,

Upper chest: Unremarkable

Other: None
IMPRESSION: No acute intracranial injury.  No calvarial fracture.

Moderate right supraorbital superficial soft tissue swelling.

No acute facial fracture.

No acute fracture or listhesis of the cervical spine.

## 2020-09-26 NOTE — Discharge Instructions (Addendum)
CT scans and x-rays are negative.  Follow-up with your doctor.  Return to the ED with new or worsening symptoms.
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
## 2020-09-26 NOTE — ED Notes (Signed)
Spoke with both the pt daughter Sherry Ruffing and Denyse Dago at the [facility] [place] about the pt condition and discharge.

## 2020-09-26 NOTE — ED Notes (Signed)
At 0717 called [facility] to transport Pt back to [facility] in [place].

## 2020-09-26 NOTE — ED Notes (Signed)
Pt cleaned and linens changed at this time due to urinary incontinence. No signs of distress and no other needs at this time. Will continue to monitor.

## 2020-11-21 ENCOUNTER — Emergency Department (HOSPITAL_COMMUNITY): Payer: Medicare Other

## 2020-11-21 ENCOUNTER — Inpatient Hospital Stay (HOSPITAL_COMMUNITY)
Admission: EM | Admit: 2020-11-21 | Discharge: 2020-11-27 | DRG: 101 | Disposition: A | Discharge status: Skilled Nursing Facility | Payer: Medicare Other | Source: Skilled Nursing Facility | Attending: Internal Medicine | Admitting: Internal Medicine

## 2020-11-21 ENCOUNTER — Other Ambulatory Visit: Payer: Self-pay

## 2020-11-21 ENCOUNTER — Encounter (HOSPITAL_COMMUNITY): Payer: Self-pay

## 2020-11-21 DIAGNOSIS — Z20822 Contact with and (suspected) exposure to covid-19: Secondary | ICD-10-CM | POA: Diagnosis present

## 2020-11-21 DIAGNOSIS — R296 Repeated falls: Secondary | ICD-10-CM | POA: Diagnosis present

## 2020-11-21 DIAGNOSIS — R079 Chest pain, unspecified: Secondary | ICD-10-CM | POA: Diagnosis present

## 2020-11-21 DIAGNOSIS — R569 Unspecified convulsions: Secondary | ICD-10-CM

## 2020-11-21 DIAGNOSIS — Z794 Long term (current) use of insulin: Secondary | ICD-10-CM

## 2020-11-21 DIAGNOSIS — E1140 Type 2 diabetes mellitus with diabetic neuropathy, unspecified: Secondary | ICD-10-CM | POA: Diagnosis present

## 2020-11-21 DIAGNOSIS — G40909 Epilepsy, unspecified, not intractable, without status epilepticus: Secondary | ICD-10-CM | POA: Diagnosis present

## 2020-11-21 DIAGNOSIS — D509 Iron deficiency anemia, unspecified: Secondary | ICD-10-CM | POA: Diagnosis present

## 2020-11-21 DIAGNOSIS — R29818 Other symptoms and signs involving the nervous system: Secondary | ICD-10-CM | POA: Diagnosis not present

## 2020-11-21 DIAGNOSIS — F419 Anxiety disorder, unspecified: Secondary | ICD-10-CM | POA: Diagnosis present

## 2020-11-21 DIAGNOSIS — Z7984 Long term (current) use of oral hypoglycemic drugs: Secondary | ICD-10-CM

## 2020-11-21 DIAGNOSIS — Z79899 Other long term (current) drug therapy: Secondary | ICD-10-CM | POA: Diagnosis not present

## 2020-11-21 DIAGNOSIS — R410 Disorientation, unspecified: Secondary | ICD-10-CM

## 2020-11-21 DIAGNOSIS — E11649 Type 2 diabetes mellitus with hypoglycemia without coma: Secondary | ICD-10-CM | POA: Diagnosis not present

## 2020-11-21 DIAGNOSIS — G459 Transient cerebral ischemic attack, unspecified: Secondary | ICD-10-CM | POA: Diagnosis not present

## 2020-11-21 DIAGNOSIS — F0280 Dementia in other diseases classified elsewhere without behavioral disturbance: Secondary | ICD-10-CM | POA: Diagnosis present

## 2020-11-21 DIAGNOSIS — R531 Weakness: Secondary | ICD-10-CM | POA: Diagnosis not present

## 2020-11-21 DIAGNOSIS — Z882 Allergy status to sulfonamides status: Secondary | ICD-10-CM

## 2020-11-21 DIAGNOSIS — G20 Parkinson's disease: Secondary | ICD-10-CM | POA: Diagnosis present

## 2020-11-21 DIAGNOSIS — G8384 Todd's paralysis (postepileptic): Secondary | ICD-10-CM | POA: Diagnosis present

## 2020-11-21 DIAGNOSIS — Z8679 Personal history of other diseases of the circulatory system: Secondary | ICD-10-CM

## 2020-11-21 DIAGNOSIS — M797 Fibromyalgia: Secondary | ICD-10-CM | POA: Diagnosis present

## 2020-11-21 DIAGNOSIS — Z7982 Long term (current) use of aspirin: Secondary | ICD-10-CM

## 2020-11-21 DIAGNOSIS — R54 Age-related physical debility: Secondary | ICD-10-CM | POA: Diagnosis present

## 2020-11-21 DIAGNOSIS — E538 Deficiency of other specified B group vitamins: Secondary | ICD-10-CM | POA: Diagnosis present

## 2020-11-21 DIAGNOSIS — F32A Depression, unspecified: Secondary | ICD-10-CM | POA: Diagnosis present

## 2020-11-21 DIAGNOSIS — Z888 Allergy status to other drugs, medicaments and biological substances status: Secondary | ICD-10-CM

## 2020-11-21 DIAGNOSIS — Z8673 Personal history of transient ischemic attack (TIA), and cerebral infarction without residual deficits: Secondary | ICD-10-CM | POA: Diagnosis not present

## 2020-11-21 DIAGNOSIS — I2583 Coronary atherosclerosis due to lipid rich plaque: Secondary | ICD-10-CM | POA: Diagnosis not present

## 2020-11-21 DIAGNOSIS — E785 Hyperlipidemia, unspecified: Secondary | ICD-10-CM | POA: Diagnosis present

## 2020-11-21 DIAGNOSIS — J45909 Unspecified asthma, uncomplicated: Secondary | ICD-10-CM | POA: Diagnosis present

## 2020-11-21 DIAGNOSIS — I10 Essential (primary) hypertension: Secondary | ICD-10-CM | POA: Diagnosis present

## 2020-11-21 DIAGNOSIS — I2510 Atherosclerotic heart disease of native coronary artery without angina pectoris: Secondary | ICD-10-CM | POA: Diagnosis present

## 2020-11-21 DIAGNOSIS — R471 Dysarthria and anarthria: Secondary | ICD-10-CM | POA: Diagnosis present

## 2020-11-21 DIAGNOSIS — F1010 Alcohol abuse, uncomplicated: Secondary | ICD-10-CM | POA: Diagnosis present

## 2020-11-21 DIAGNOSIS — Z139 Encounter for screening, unspecified: Secondary | ICD-10-CM

## 2020-11-21 DIAGNOSIS — Z9049 Acquired absence of other specified parts of digestive tract: Secondary | ICD-10-CM

## 2020-11-21 DIAGNOSIS — Z7902 Long term (current) use of antithrombotics/antiplatelets: Secondary | ICD-10-CM

## 2020-11-21 LAB — I-STAT CHEM 8, ED
BUN: 7 mg/dL — ABNORMAL LOW (ref 8–23)
Calcium, Ion: 1.1 mmol/L — ABNORMAL LOW (ref 1.15–1.40)
Chloride: 100 mmol/L (ref 98–111)
Creatinine, Ser: 0.5 mg/dL (ref 0.44–1.00)
Glucose, Bld: 161 mg/dL — ABNORMAL HIGH (ref 70–99)
HCT: 33 % — ABNORMAL LOW (ref 36.0–46.0)
Hemoglobin: 11.2 g/dL — ABNORMAL LOW (ref 12.0–15.0)
Potassium: 3.8 mmol/L (ref 3.5–5.1)
Sodium: 138 mmol/L (ref 135–145)
TCO2: 26 mmol/L (ref 22–32)

## 2020-11-21 LAB — CBC WITH DIFFERENTIAL/PLATELET
Abs Immature Granulocytes: 0.03 10*3/uL (ref 0.00–0.07)
Basophils Absolute: 0 10*3/uL (ref 0.0–0.1)
Basophils Relative: 0 %
Eosinophils Absolute: 0.2 10*3/uL (ref 0.0–0.5)
Eosinophils Relative: 3 %
HCT: 32.6 % — ABNORMAL LOW (ref 36.0–46.0)
Hemoglobin: 10.3 g/dL — ABNORMAL LOW (ref 12.0–15.0)
Immature Granulocytes: 0 %
Lymphocytes Relative: 15 %
Lymphs Abs: 1 10*3/uL (ref 0.7–4.0)
MCH: 24.9 pg — ABNORMAL LOW (ref 26.0–34.0)
MCHC: 31.6 g/dL (ref 30.0–36.0)
MCV: 78.7 fL — ABNORMAL LOW (ref 80.0–100.0)
Monocytes Absolute: 0.4 10*3/uL (ref 0.1–1.0)
Monocytes Relative: 6 %
Neutro Abs: 5.3 10*3/uL (ref 1.7–7.7)
Neutrophils Relative %: 76 %
Platelets: 300 10*3/uL (ref 150–400)
RBC: 4.14 MIL/uL (ref 3.87–5.11)
RDW: 14.9 % (ref 11.5–15.5)
WBC: 7 10*3/uL (ref 4.0–10.5)
nRBC: 0 % (ref 0.0–0.2)

## 2020-11-21 LAB — RESP PANEL BY RT-PCR (FLU A&B, COVID) ARPGX2
Influenza A by PCR: NEGATIVE
Influenza B by PCR: NEGATIVE
SARS Coronavirus 2 by RT PCR: NEGATIVE

## 2020-11-21 LAB — URINALYSIS, ROUTINE W REFLEX MICROSCOPIC
Bilirubin Urine: NEGATIVE
Glucose, UA: NEGATIVE mg/dL
Hgb urine dipstick: NEGATIVE
Ketones, ur: NEGATIVE mg/dL
Leukocytes,Ua: NEGATIVE
Nitrite: NEGATIVE
Protein, ur: NEGATIVE mg/dL
Specific Gravity, Urine: 1.015 (ref 1.005–1.030)
pH: 5.5 (ref 5.0–8.0)

## 2020-11-21 LAB — COMPREHENSIVE METABOLIC PANEL
ALT: 6 U/L (ref 0–44)
AST: 25 U/L (ref 15–41)
Albumin: 3.5 g/dL (ref 3.5–5.0)
Alkaline Phosphatase: 114 U/L (ref 38–126)
Anion gap: 11 (ref 5–15)
BUN: 9 mg/dL (ref 8–23)
CO2: 25 mmol/L (ref 22–32)
Calcium: 8.7 mg/dL — ABNORMAL LOW (ref 8.9–10.3)
Chloride: 99 mmol/L (ref 98–111)
Creatinine, Ser: 0.63 mg/dL (ref 0.44–1.00)
GFR, Estimated: >60 mL/min (ref >60)
Glucose, Bld: 158 mg/dL — ABNORMAL HIGH (ref 70–99)
Potassium: 3.6 mmol/L (ref 3.5–5.1)
Sodium: 135 mmol/L (ref 135–145)
Total Bilirubin: 0.3 mg/dL (ref 0.3–1.2)
Total Protein: 7.6 g/dL (ref 6.5–8.1)

## 2020-11-21 LAB — ETHANOL: Alcohol, Ethyl (B): <10 mg/dL (ref <10)

## 2020-11-21 LAB — RAPID URINE DRUG SCREEN, HOSP PERFORMED
Amphetamines: NOT DETECTED
Barbiturates: NOT DETECTED
Benzodiazepines: NOT DETECTED
Cocaine: NOT DETECTED
Opiates: NOT DETECTED
Tetrahydrocannabinol: NOT DETECTED

## 2020-11-21 LAB — CBG MONITORING, ED: Glucose-Capillary: 148 mg/dL — ABNORMAL HIGH (ref 70–99)

## 2020-11-21 LAB — APTT: aPTT: 30 seconds (ref 24–36)

## 2020-11-21 LAB — PROTIME-INR
INR: 1 (ref 0.8–1.2)
Prothrombin Time: 13.1 seconds (ref 11.4–15.2)

## 2020-11-21 LAB — GLUCOSE, CAPILLARY: Glucose-Capillary: 73 mg/dL (ref 70–99)

## 2020-11-21 IMAGING — CT CT HEAD CODE STROKE
3 series · 16 of 47 positions shown, 19 images · non-contrast
Comparison: CT head [DATE].

CLINICAL DATA: Code stroke.  Neuro deficit, acute, stroke suspected

EXAM:
CT HEAD WITHOUT CONTRAST
TECHNIQUE: Contiguous axial images were obtained from the base of the skull
through the vertex without intravenous contrast.

[Series 2: head w o · axial · 0.46mm/px · z∈[+1500,+1630]mm · 10 of 32 slices shown, 13 images]
[im 3/32  brain]
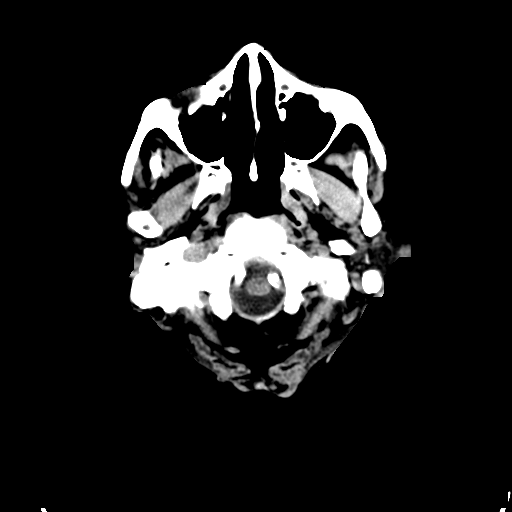
[im 3/32  bone]
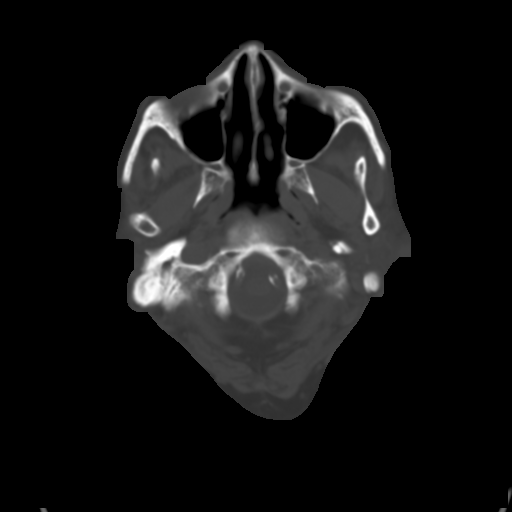
[im 6/32  brain]
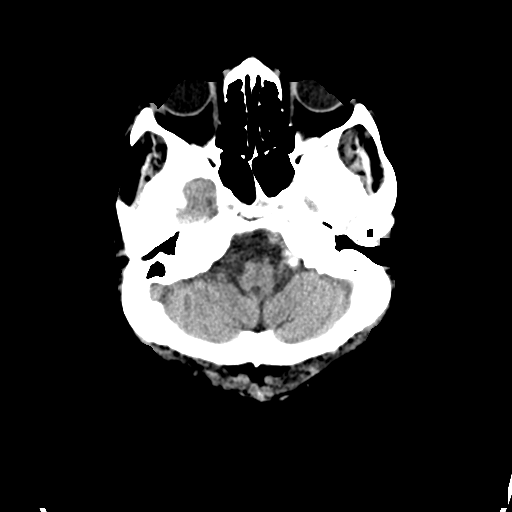
[im 9/32  brain]
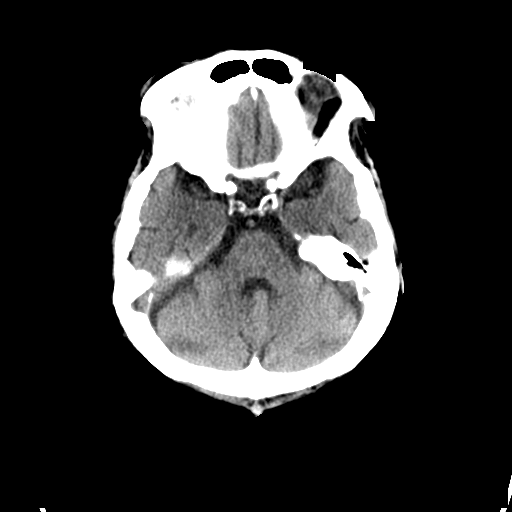
[im 11/32  brain]
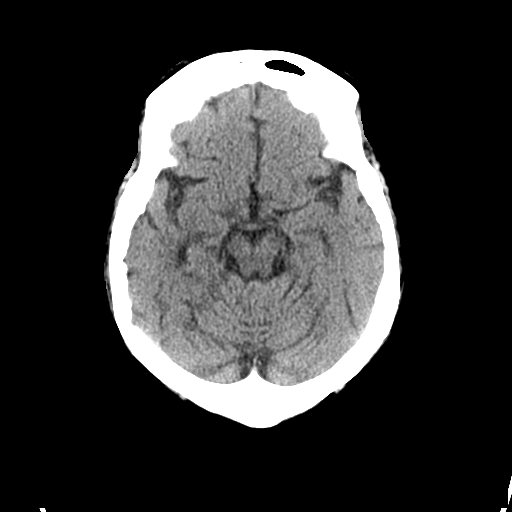
[im 14/32  brain]
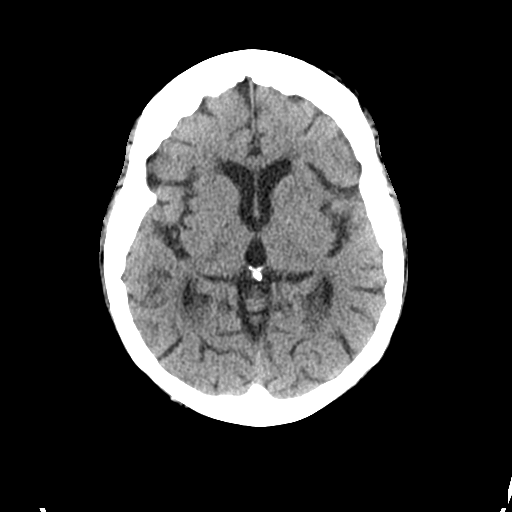
[im 14/32  bone]
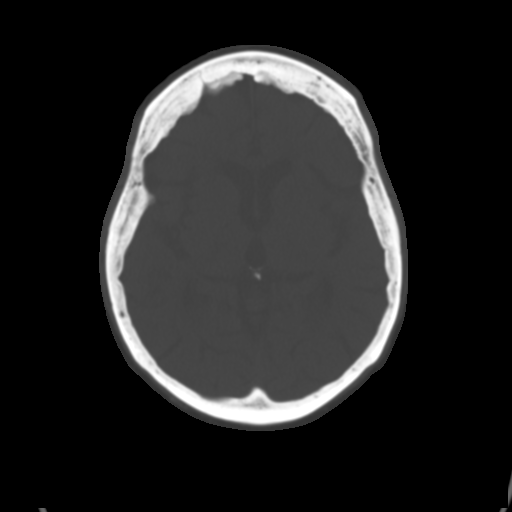
[im 18/32  brain]
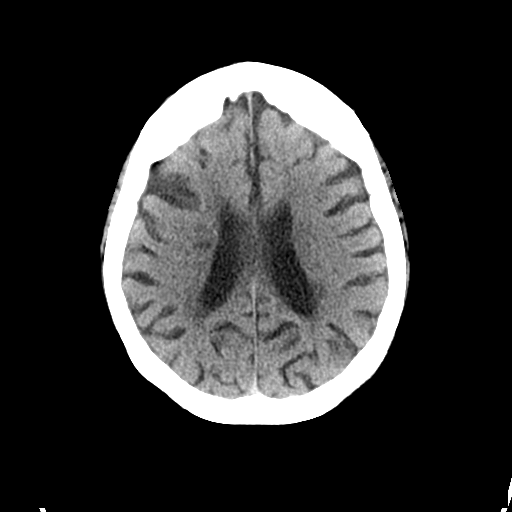
[im 21/32  brain]
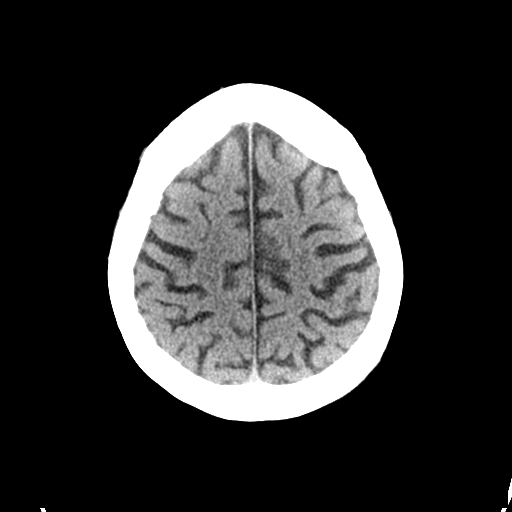
[im 24/32  brain]
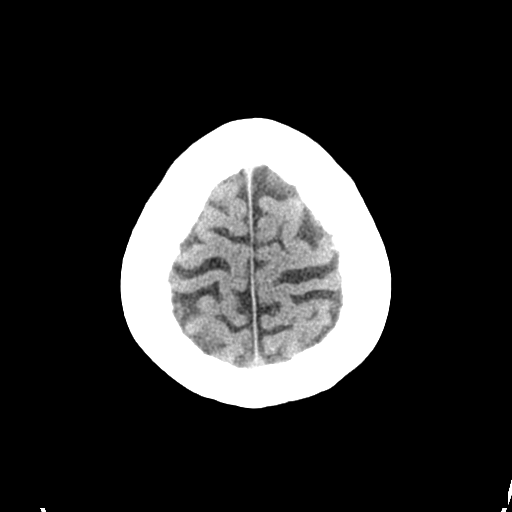
[im 26/32  brain]
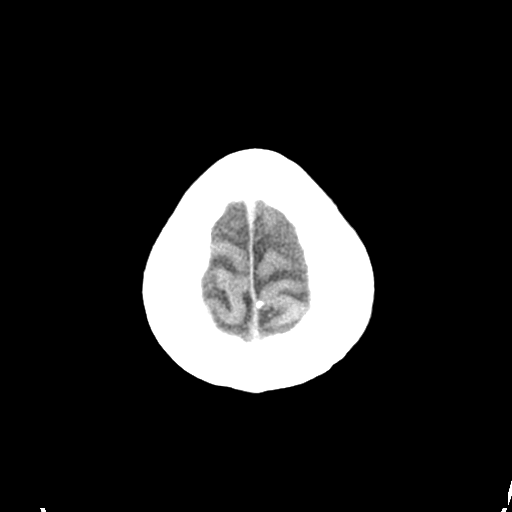
[im 26/32  bone]
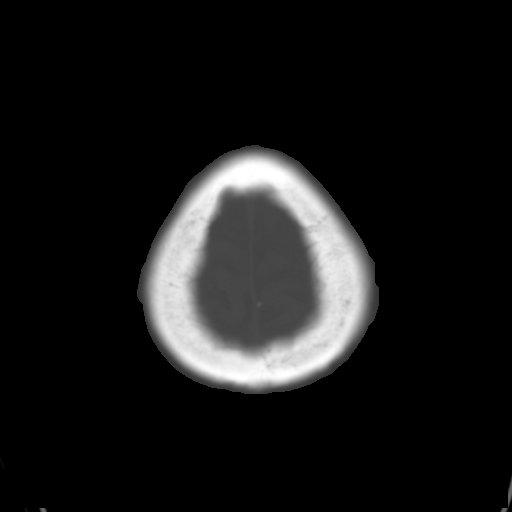
[im 29/32  brain]
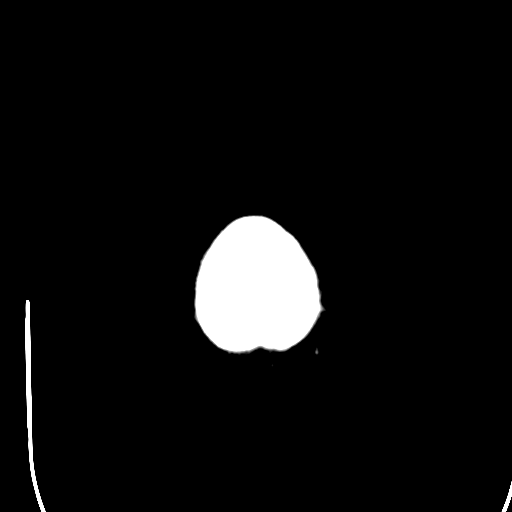

[Series 4: coronal soft · coronal · 0.33mm/px · 3 of 65 slices shown]
[im 22/65  brain]
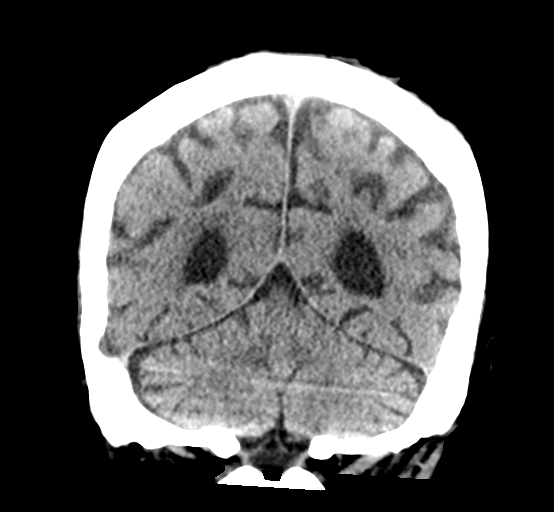
[im 29/65  brain]
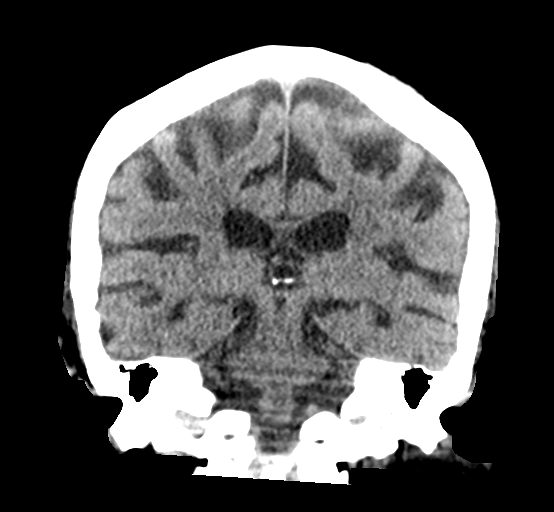
[im 36/65  brain]
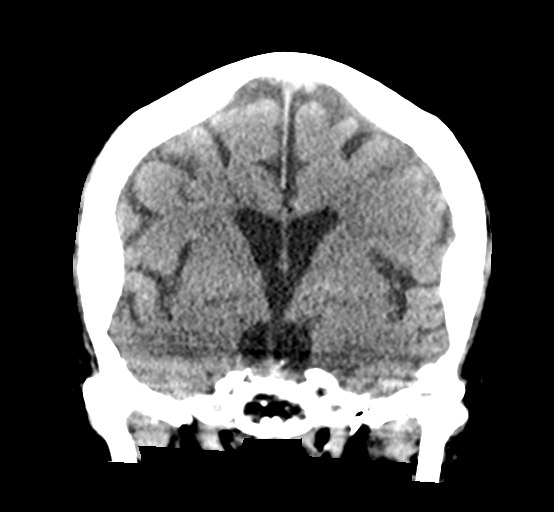

[Series 5: sagittal soft · sagittal · 0.33mm/px · 3 of 62 slices shown]
[im 21/62  brain]
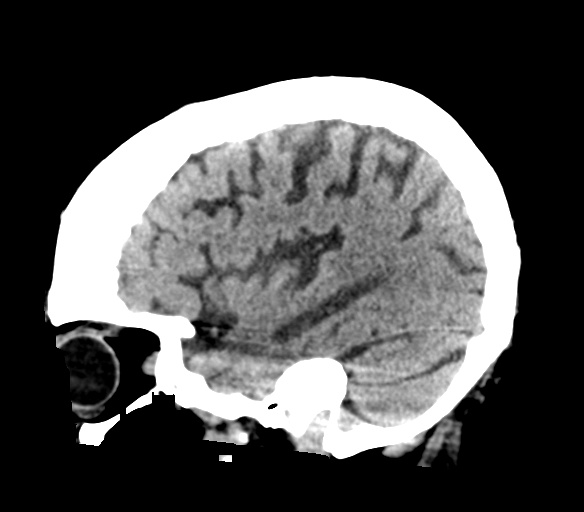
[im 31/62  brain]
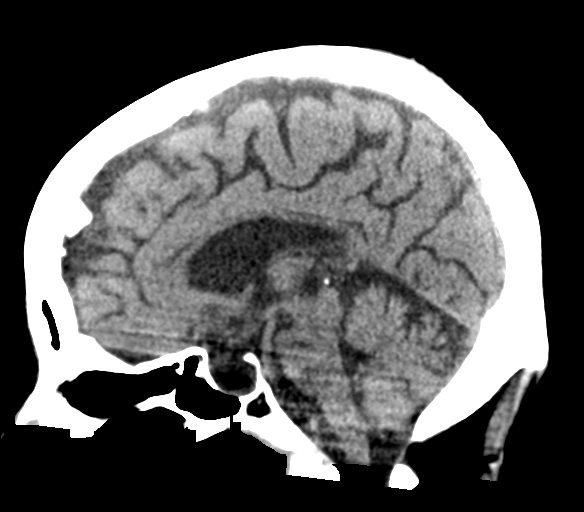
[im 41/62  brain]
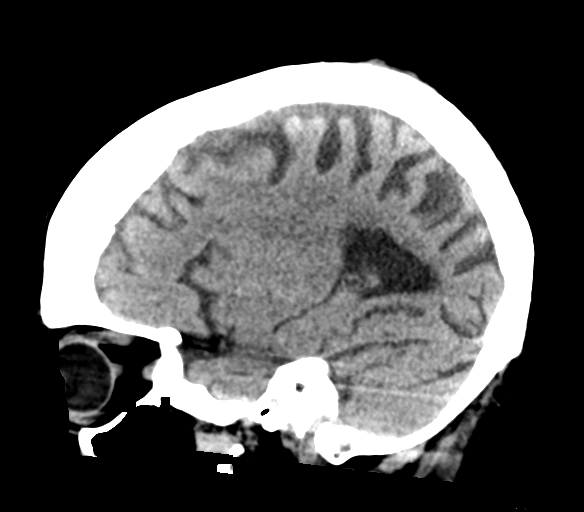

[16 of 47 positions shown; findings below may reference images not displayed]

FINDINGS: Brain: No evidence of acute large vascular territory infarction,
hemorrhage, hydrocephalus, extra-axial collection or mass
lesion/mass effect. Similar patchy white matter hypoattenuation,
compatible with chronic microvascular ischemic disease. Probable
remote lacunar infarcts in bilateral basal ganglia and right corona
radiata, similar to priors

Vascular: No hyperdense vessel identified.

Skull: No acute fracture.

Sinuses/Orbits: Clear sinuses.  Unremarkable orbits.

Other: No mastoid effusions

ASPECTS ([place] Stroke Program Early CT Score) total score (0-10
with 10 being normal): 10.
IMPRESSION: 1. No evidence of acute large vascular territory infarct or acute
hemorrhage.
2. ASPECTS is 10
3. Chronic microvascular ischemic disease with probable remote
lacunar infarcts in bilateral basal ganglia and corona radiata,
similar to prior.
4. Partially empty sella, which is often a normal anatomic variant
but can be associated with idiopathic intracranial hypertension.

Findings discussed with Dr. MONIATI via telephone at [DATE] p.m.

## 2020-11-21 MED ORDER — ENOXAPARIN SODIUM 40 MG/0.4ML IJ SOSY
40.0000 mg | PREFILLED_SYRINGE | INTRAMUSCULAR | Status: DC
  Administered 2020-11-21 – 2020-11-26 (×6): 40 mg via SUBCUTANEOUS
  Filled 2020-11-21 (×6): qty 0.4

## 2020-11-21 MED ORDER — SENNOSIDES-DOCUSATE SODIUM 8.6-50 MG PO TABS: 1.0000 | ORAL_TABLET | Freq: Every evening | ORAL | Status: DC | PRN

## 2020-11-21 MED ORDER — STROKE: EARLY STAGES OF RECOVERY BOOK
Freq: Once | Status: AC
  Filled 2020-11-21 (×2): qty 1

## 2020-11-21 MED ORDER — NITROGLYCERIN 0.4 MG SL SUBL: 0.4000 mg | SUBLINGUAL_TABLET | SUBLINGUAL | Status: DC | PRN

## 2020-11-21 MED ORDER — FUROSEMIDE 40 MG PO TABS
40.0000 mg | ORAL_TABLET | Freq: Two times a day (BID) | ORAL | Status: DC
  Filled 2020-11-21: qty 1

## 2020-11-21 MED ORDER — SENNA 8.6 MG PO TABS
1.0000 | ORAL_TABLET | Freq: Every evening | ORAL | Status: DC
  Administered 2020-11-22 – 2020-11-26 (×5): 8.6 mg via ORAL
  Filled 2020-11-21 (×5): qty 1

## 2020-11-21 MED ORDER — SODIUM CHLORIDE 0.9 % IV SOLN: INTRAVENOUS | Status: DC

## 2020-11-21 MED ORDER — ACETAMINOPHEN 650 MG RE SUPP
650.0000 mg | RECTAL | Status: DC | PRN
  Administered 2020-11-22: 650 mg via RECTAL
  Filled 2020-11-21: qty 1

## 2020-11-21 MED ORDER — DONEPEZIL HCL 10 MG PO TABS
10.0000 mg | ORAL_TABLET | Freq: Every day | ORAL | Status: DC
  Administered 2020-11-22: 10 mg via ORAL
  Filled 2020-11-21: qty 1

## 2020-11-21 MED ORDER — LORAZEPAM 2 MG/ML IJ SOLN
1.0000 mg | Freq: Once | INTRAMUSCULAR | Status: AC
  Administered 2020-11-21: 1 mg via INTRAVENOUS
  Filled 2020-11-21: qty 1

## 2020-11-21 MED ORDER — METOPROLOL TARTRATE 50 MG PO TABS
50.0000 mg | ORAL_TABLET | Freq: Two times a day (BID) | ORAL | Status: DC
  Administered 2020-11-22 – 2020-11-27 (×10): 50 mg via ORAL
  Filled 2020-11-21 (×11): qty 1

## 2020-11-21 MED ORDER — MIRTAZAPINE 15 MG PO TABS
7.5000 mg | ORAL_TABLET | Freq: Every day | ORAL | Status: DC
  Administered 2020-11-22 – 2020-11-26 (×5): 7.5 mg via ORAL
  Filled 2020-11-21 (×5): qty 1

## 2020-11-21 MED ORDER — INSULIN GLARGINE-YFGN 100 UNIT/ML ~~LOC~~ SOLN
10.0000 [IU] | Freq: Every day | SUBCUTANEOUS | Status: DC
  Filled 2020-11-21 (×3): qty 0.1

## 2020-11-21 MED ORDER — CARBIDOPA-LEVODOPA 25-100 MG PO TABS
1.5000 | ORAL_TABLET | Freq: Three times a day (TID) | ORAL | Status: DC
  Administered 2020-11-22 – 2020-11-27 (×15): 1.5 via ORAL
  Filled 2020-11-21 (×15): qty 2

## 2020-11-21 MED ORDER — ACETAMINOPHEN 160 MG/5ML PO SOLN: 650.0000 mg | ORAL | Status: DC | PRN

## 2020-11-21 MED ORDER — INSULIN ASPART 100 UNIT/ML IJ SOLN
0.0000 [IU] | Freq: Three times a day (TID) | INTRAMUSCULAR | Status: DC
  Administered 2020-11-23 – 2020-11-24 (×2): 1 [IU] via SUBCUTANEOUS
  Administered 2020-11-24: 2 [IU] via SUBCUTANEOUS
  Administered 2020-11-25 (×2): 1 [IU] via SUBCUTANEOUS
  Administered 2020-11-26 (×2): 2 [IU] via SUBCUTANEOUS

## 2020-11-21 MED ORDER — SERTRALINE HCL 100 MG PO TABS
100.0000 mg | ORAL_TABLET | Freq: Every day | ORAL | Status: DC
  Administered 2020-11-23 – 2020-11-27 (×5): 100 mg via ORAL
  Filled 2020-11-21 (×6): qty 1

## 2020-11-21 MED ORDER — ASPIRIN EC 81 MG PO TBEC
81.0000 mg | DELAYED_RELEASE_TABLET | Freq: Every evening | ORAL | Status: DC
  Administered 2020-11-22 – 2020-11-26 (×5): 81 mg via ORAL
  Filled 2020-11-21 (×5): qty 1

## 2020-11-21 MED ORDER — ACETAMINOPHEN 325 MG PO TABS: 650.0000 mg | ORAL_TABLET | ORAL | Status: DC | PRN

## 2020-11-21 MED ORDER — INSULIN ASPART 100 UNIT/ML IJ SOLN: 0.0000 [IU] | Freq: Every day | INTRAMUSCULAR | Status: DC

## 2020-11-21 MED ORDER — CYANOCOBALAMIN 1000 MCG/ML IJ SOLN: 1000.0000 ug | INTRAMUSCULAR | Status: DC

## 2020-11-21 MED ORDER — ATORVASTATIN CALCIUM 80 MG PO TABS
80.0000 mg | ORAL_TABLET | Freq: Every day | ORAL | Status: DC
  Administered 2020-11-22 – 2020-11-26 (×5): 80 mg via ORAL
  Filled 2020-11-21 (×5): qty 1

## 2020-11-21 MED ORDER — POTASSIUM CHLORIDE CRYS ER 10 MEQ PO TBCR
40.0000 meq | EXTENDED_RELEASE_TABLET | Freq: Every day | ORAL | Status: DC
  Administered 2020-11-23: 40 meq via ORAL
  Filled 2020-11-21 (×3): qty 4

## 2020-11-21 NOTE — ED Notes (Signed)
PA Autri in room performing assessment.
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
## 2020-11-21 NOTE — ED Provider Notes (Signed)
Dr. Iver Nestle (neurologist) did see pt.  She recommended admission to [facility], MRI, and EEG.  At this time, pt is back to her normal mental status per daughter, so she does not meet tpa/tnk criteria.  Pt d/w Dr. Alvester Morin ([facility]) for admission.   Jacalyn Lefevre, MD 11/21/20 [phone]

## 2020-11-21 NOTE — ED Notes (Signed)
PT arrived soiled. Cleaned pt, put a new gown on, hooked up to monitor, got ekg, and vitals. Call light in reach. Purewick also placed.

## 2020-11-21 NOTE — ED Provider Notes (Signed)
Medical screening examination/treatment/procedure(s) were conducted as a shared visit with non-physician practitioner(s) and myself.  I personally evaluated the patient during the encounter.  Clinical Impression:   Final diagnoses:  None    This patient is an ill-appearing 65 year old female presenting from the nursing facility where she currently lives because of a prior history of stroke, progressive supranuclear palsy, parkinsonism, she has multiple other medical problems as well, currently with left-sided deficits after being seen to have an episode where she was talking to her self and staring off lasting about 2 minutes, at this time the patient has left-sided facial droop left-sided weakness of the arm and the leg, according to the nursing facility this is new when she usually walks with a walker.  I discussed the case with Dr. Iver Nestle at of the neuro hospitalist service who recommends that we activated code stroke and will see the patient in consultation.  Lastly normal was 12:50 PM, it is currently 2:50 PM.  There was a delay in getting time frames and activation of the code stroke secondary to the inability of discussing this with the nursing facility in a expedited manner as the facility was difficult to get a hold of and not answering their phones in a timely manner  Anticipate likely admission to the hospital.  At change of shift care signed out to Dr. Jacalyn Lefevre to follow-up results, follow-up neurologic recommendations and anticipate admit to the hospital.   EKG Interpretation  Date/Time:  Saturday November 21 2020 14:37:37 EDT Ventricular Rate:  86 PR Interval:  169 QRS Duration: 95 QT Interval:  382 QTC Calculation: 457 R Axis:   -24 Text Interpretation: Sinus rhythm Borderline left axis deviation Consider anterior infarct Since last tracing rate faster Confirmed by Eber Hong ([id number]) on 11/21/2020 2:57:02 PM          Eber Hong, MD 11/21/20 1514

## 2020-11-21 NOTE — Progress Notes (Signed)
1501 CALL TIME 1501 BEEPER TIME 1504 EXAM STARTED 1506 EXAM FINISHED 1506 IMAGES SENT TO SOC 1510 EXAM COMPLETED IN Epic 1510 [facility] CALLED

## 2020-11-21 NOTE — ED Triage Notes (Signed)
Pt was picked up from [facility] in [place] by [facility] with complaint of seizure that lasted 2 minutes per facility. When EMS arrived pt was post ictal.
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
## 2020-11-21 NOTE — ED Notes (Signed)
Neurologist recommended  MRI and EEG for patient.

## 2020-11-22 ENCOUNTER — Inpatient Hospital Stay (HOSPITAL_COMMUNITY): Payer: Medicare Other

## 2020-11-22 DIAGNOSIS — R531 Weakness: Secondary | ICD-10-CM | POA: Diagnosis not present

## 2020-11-22 DIAGNOSIS — G459 Transient cerebral ischemic attack, unspecified: Secondary | ICD-10-CM

## 2020-11-22 DIAGNOSIS — R569 Unspecified convulsions: Secondary | ICD-10-CM | POA: Diagnosis not present

## 2020-11-22 DIAGNOSIS — R29818 Other symptoms and signs involving the nervous system: Secondary | ICD-10-CM

## 2020-11-22 LAB — CBC
HCT: 31.6 % — ABNORMAL LOW (ref 36.0–46.0)
HCT: 31.3 % — ABNORMAL LOW (ref 36.0–46.0)
Hemoglobin: 10.2 g/dL — ABNORMAL LOW (ref 12.0–15.0)
Hemoglobin: 9.8 g/dL — ABNORMAL LOW (ref 12.0–15.0)
MCH: 24.7 pg — ABNORMAL LOW (ref 26.0–34.0)
MCH: 24.9 pg — ABNORMAL LOW (ref 26.0–34.0)
MCHC: 32.3 g/dL (ref 30.0–36.0)
MCHC: 31.3 g/dL (ref 30.0–36.0)
MCV: 76.5 fL — ABNORMAL LOW (ref 80.0–100.0)
MCV: 79.4 fL — ABNORMAL LOW (ref 80.0–100.0)
Platelets: 286 10*3/uL (ref 150–400)
Platelets: 257 10*3/uL (ref 150–400)
RBC: 4.13 MIL/uL (ref 3.87–5.11)
RBC: 3.94 MIL/uL (ref 3.87–5.11)
RDW: 14.8 % (ref 11.5–15.5)
RDW: 14.8 % (ref 11.5–15.5)
WBC: 7.3 10*3/uL (ref 4.0–10.5)
WBC: 5.5 10*3/uL (ref 4.0–10.5)
nRBC: 0 % (ref 0.0–0.2)
nRBC: 0 % (ref 0.0–0.2)

## 2020-11-22 LAB — LIPID PANEL
Cholesterol: 81 mg/dL (ref 0–200)
Cholesterol: 81 mg/dL (ref 0–200)
HDL: 38 mg/dL — ABNORMAL LOW (ref >40)
HDL: 38 mg/dL — ABNORMAL LOW (ref >40)
LDL Cholesterol: 32 mg/dL (ref 0–99)
LDL Cholesterol: 27 mg/dL (ref 0–99)
Total CHOL/HDL Ratio: 2.1 RATIO
Total CHOL/HDL Ratio: 2.1 RATIO
Triglycerides: 55 mg/dL (ref <150)
Triglycerides: 81 mg/dL (ref <150)
VLDL: 11 mg/dL (ref 0–40)
VLDL: 16 mg/dL (ref 0–40)

## 2020-11-22 LAB — BASIC METABOLIC PANEL
Anion gap: 8 (ref 5–15)
BUN: 5 mg/dL — ABNORMAL LOW (ref 8–23)
CO2: 24 mmol/L (ref 22–32)
Calcium: 9 mg/dL (ref 8.9–10.3)
Chloride: 107 mmol/L (ref 98–111)
Creatinine, Ser: 0.56 mg/dL (ref 0.44–1.00)
GFR, Estimated: >60 mL/min (ref >60)
Glucose, Bld: 68 mg/dL — ABNORMAL LOW (ref 70–99)
Potassium: 3.7 mmol/L (ref 3.5–5.1)
Sodium: 139 mmol/L (ref 135–145)

## 2020-11-22 LAB — TROPONIN I (HIGH SENSITIVITY)
Troponin I (High Sensitivity): 11 ng/L (ref <18)
Troponin I (High Sensitivity): 10 ng/L (ref <18)

## 2020-11-22 LAB — COMPREHENSIVE METABOLIC PANEL
ALT: 15 U/L (ref 0–44)
AST: 23 U/L (ref 15–41)
Albumin: 3.2 g/dL — ABNORMAL LOW (ref 3.5–5.0)
Alkaline Phosphatase: 97 U/L (ref 38–126)
Anion gap: 8 (ref 5–15)
BUN: 5 mg/dL — ABNORMAL LOW (ref 8–23)
CO2: 26 mmol/L (ref 22–32)
Calcium: 9.3 mg/dL (ref 8.9–10.3)
Chloride: 105 mmol/L (ref 98–111)
Creatinine, Ser: 0.6 mg/dL (ref 0.44–1.00)
GFR, Estimated: >60 mL/min (ref >60)
Glucose, Bld: 98 mg/dL (ref 70–99)
Potassium: 4.2 mmol/L (ref 3.5–5.1)
Sodium: 139 mmol/L (ref 135–145)
Total Bilirubin: 0.5 mg/dL (ref 0.3–1.2)
Total Protein: 7.2 g/dL (ref 6.5–8.1)

## 2020-11-22 LAB — ECHOCARDIOGRAM COMPLETE
Area-P 1/2: 4.15 cm2
Height: 63 in
P 1/2 time: 635 msec
S' Lateral: 2.2 cm
Weight: 2448 oz

## 2020-11-22 LAB — GLUCOSE, CAPILLARY
Glucose-Capillary: 80 mg/dL (ref 70–99)
Glucose-Capillary: 71 mg/dL (ref 70–99)
Glucose-Capillary: 75 mg/dL (ref 70–99)
Glucose-Capillary: 73 mg/dL (ref 70–99)
Glucose-Capillary: 181 mg/dL — ABNORMAL HIGH (ref 70–99)

## 2020-11-22 LAB — HEMOGLOBIN A1C
Hgb A1c MFr Bld: 6.5 % — ABNORMAL HIGH (ref 4.8–5.6)
Hgb A1c MFr Bld: 6.5 % — ABNORMAL HIGH (ref 4.8–5.6)
Mean Plasma Glucose: 139.85 mg/dL
Mean Plasma Glucose: 139.85 mg/dL

## 2020-11-22 LAB — VITAMIN B12: Vitamin B-12: 554 pg/mL (ref 180–914)

## 2020-11-22 LAB — HIV ANTIBODY (ROUTINE TESTING W REFLEX): HIV Screen 4th Generation wRfx: NONREACTIVE

## 2020-11-22 IMAGING — MR MR HEAD W/O CM
13 series · 48 of 48 positions shown · IV contrast (gadavist)
Comparison: CT [DATE].

CLINICAL DATA: cva; Stroke, follow up

EXAM:
MRI HEAD WITHOUT CONTRAST
MRA HEAD WITHOUT CONTRAST
MRA NECK WITHOUT AND WITH CONTRAST
TECHNIQUE: Multiplanar, multi-echo pulse sequences of the brain and surrounding
structures were acquired without intravenous contrast. Angiographic
images of the Circle of Willis were acquired using MRA technique
without intravenous contrast. Angiographic images of the neck were
acquired using MRA technique without and with intravenous contrast.
Carotid stenosis measurements (when applicable) are obtained
utilizing NASCET criteria, using the distal internal carotid
diameter as the denominator.
CONTRAST:  6.9mL GADAVIST GADOBUTROL 1 MMOL/ML IV SOLN

[Series 5: DWI · axial · 3.0mm · 0.88mm/px · z∈[-163,-18]mm · 9 of 100 slices shown (1 of 4)]
[im 1/100]
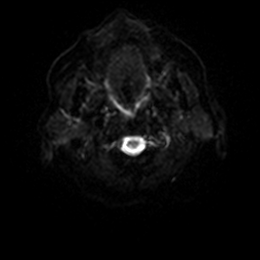
[im 13/100]
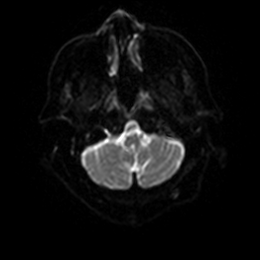
[im 25/100]
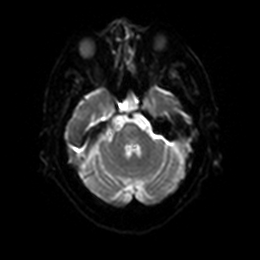
[im 38/100]
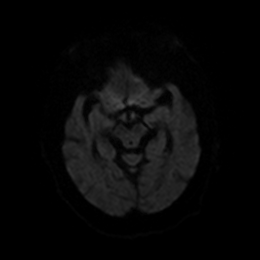
[im 50/100]
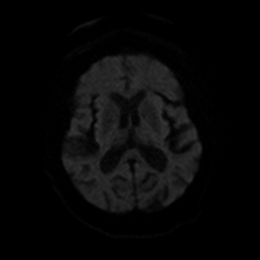
[im 62/100]
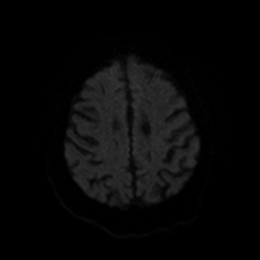
[im 75/100]
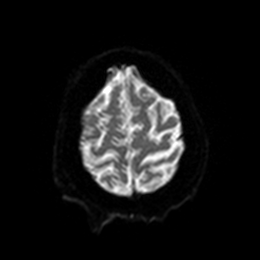
[im 87/100]
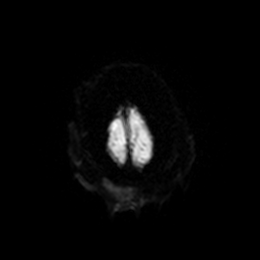
[im 100/100]
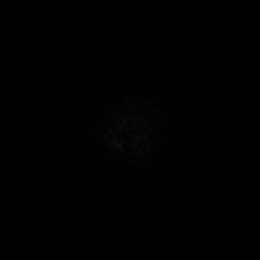

[Series 6: DWI · axial · 3.0mm · 0.88mm/px · z∈[-163,-18]mm · 4 of 50 slices shown (2 of 4)]
[im 1/50]
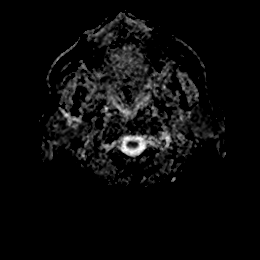
[im 17/50]
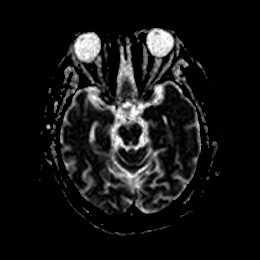
[im 33/50]
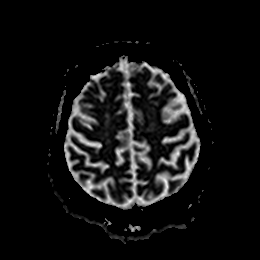
[im 50/50]
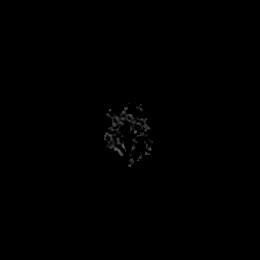

[Series 7: DWI · coronal · 4.0mm · 0.88mm/px · 6 of 72 slices shown (3 of 4)]
[im 1/72]
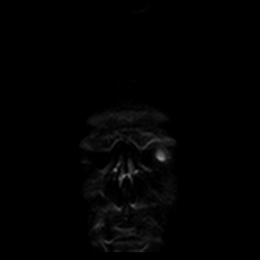
[im 15/72]
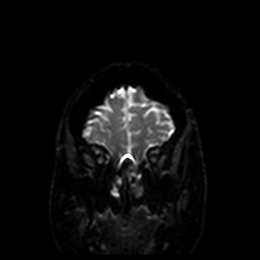
[im 29/72]
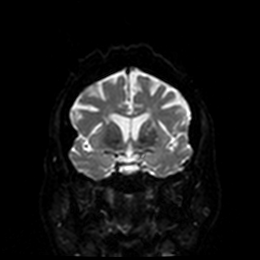
[im 43/72]
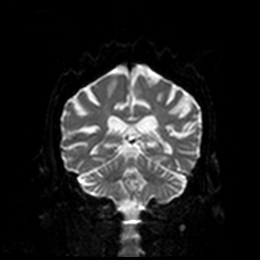
[im 57/72]
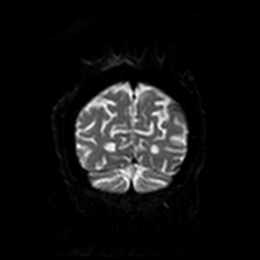
[im 72/72]
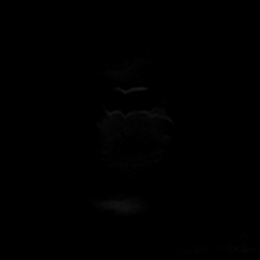

[Series 8: DWI · coronal · 4.0mm · 0.88mm/px · 3 of 36 slices shown (4 of 4)]
[im 1/36]
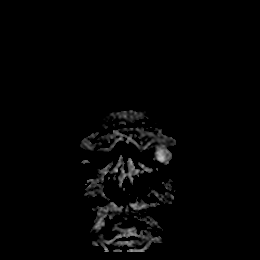
[im 18/36]
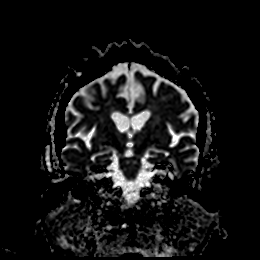
[im 36/36]
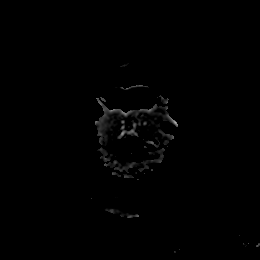

[Series 9: T1 · sagittal · 5.0mm · 0.75mm/px · 2 of 25 slices shown]
[im 1/25]
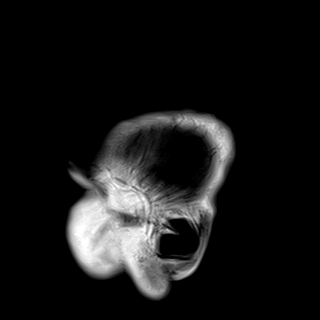
[im 25/25]
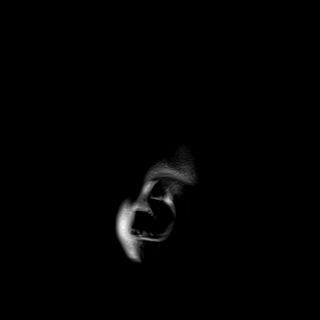

[Series 10: T2 · axial · 5.0mm · 0.72mm/px · z∈[-161,-20]mm · 2 of 25 slices shown (1 of 2)]
[im 1/25]
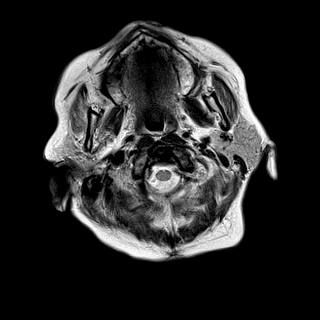
[im 25/25]
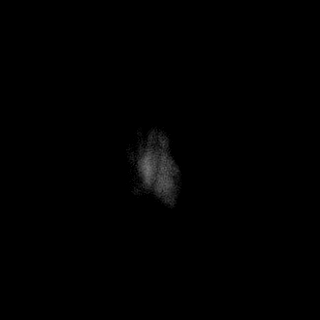

[Series 12: mag_images · axial · 3.0mm · 0.90mm/px · z∈[-165,-15]mm · 4 of 52 slices shown]
[im 1/52]
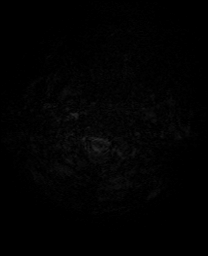
[im 18/52]
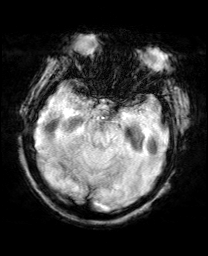
[im 35/52]
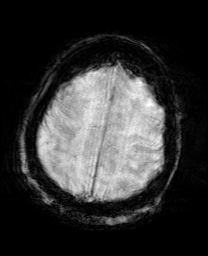
[im 52/52]
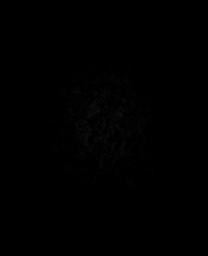

[Series 13: pha_images · axial · 3.0mm · 0.90mm/px · z∈[-165,-18]mm · 4 of 51 slices shown]
[im 1/51]
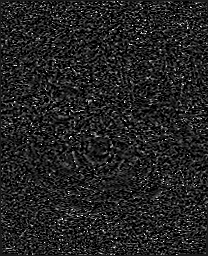
[im 17/51]
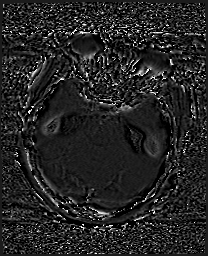
[im 34/51]
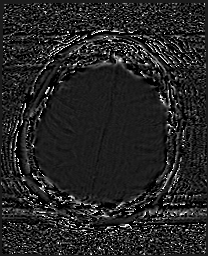
[im 51/51]
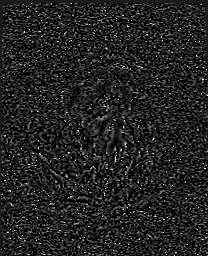

[Series 14: swi_images · axial · 3.0mm · 0.90mm/px · z∈[-165,-15]mm · 4 of 52 slices shown]
[im 1/52]
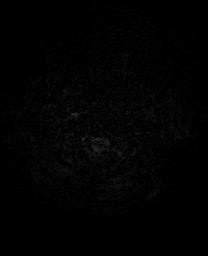
[im 18/52]
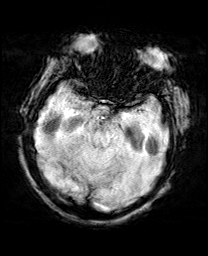
[im 35/52]
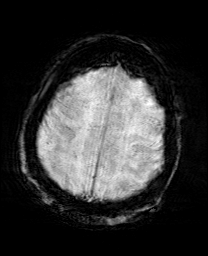
[im 52/52]
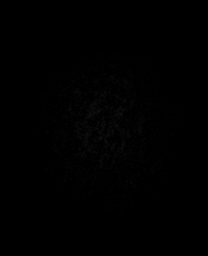

[Series 15: mip_images(sw) · axial · 24.0mm · 0.90mm/px · z∈[-155,-25]mm · 4 of 45 slices shown]
[im 1/45]
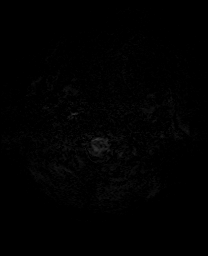
[im 15/45]
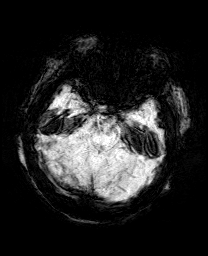
[im 30/45]
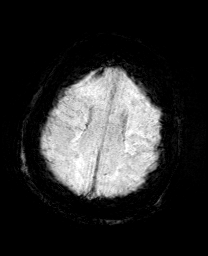
[im 45/45]
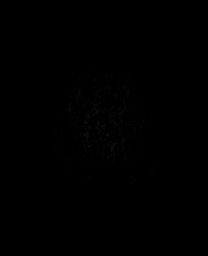

[Series 16: FLAIR · axial · 5.0mm · 0.90mm/px · z∈[-161,-20]mm · 2 of 25 slices shown (1 of 2)]
[im 1/25]
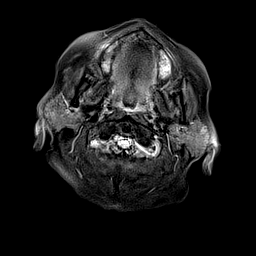
[im 25/25]
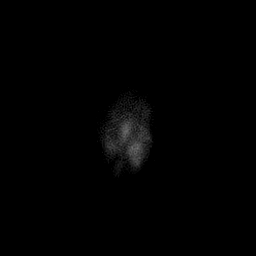

[Series 17: FLAIR · axial · 5.0mm · 0.40mm/px · z∈[-161,-19]mm · 2 of 25 slices shown (2 of 2)]
[im 1/25]
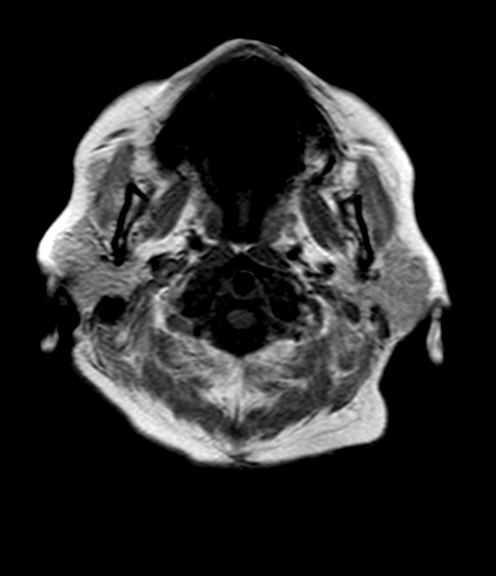
[im 25/25]
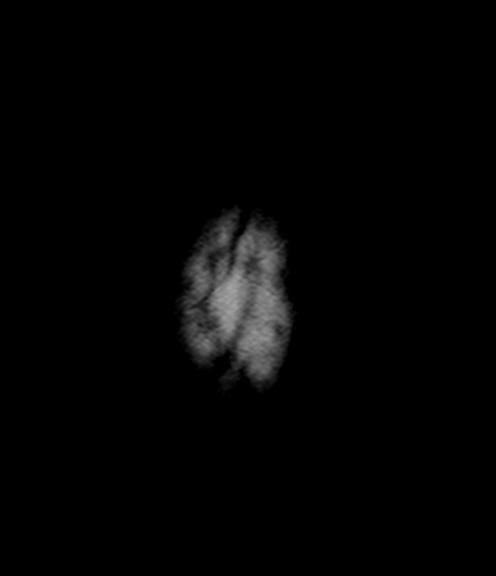

[Series 18: T2 · coronal · 5.0mm · 0.72mm/px · 2 of 28 slices shown (2 of 2)]
[im 1/28]
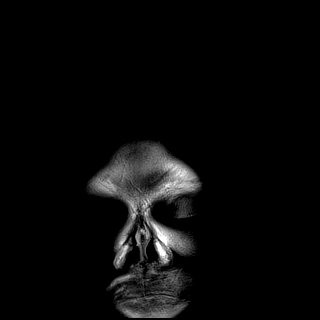
[im 28/28]
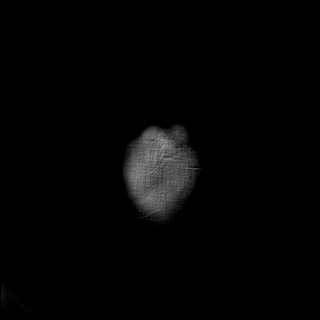

[48 of 48 positions shown; findings below may reference images not displayed]

FINDINGS: MRI HEAD FINDINGS
Motion limited study.  Within this limitation:

Brain: No acute infarction, hemorrhage, hydrocephalus, extra-axial
collection or mass lesion. No pathologic intracranial enhancement.
Moderate scattered T2 hyperintensities within the supraventricular
and pontine white matter, nonspecific but most likely secondary to
chronic microvascular ischemic disease given the patient's known
risk factors (including diabetes and hypertension). Small remote
infarcts in the right basal ganglia, left frontal white matter and
left cerebellum. Small focus of susceptibility artifact within the
left cerebellum and right thalamus, likely the sequela of prior
microhemorrhage.

Vascular: See below.

Skull and upper cervical spine: Normal marrow signal.

Sinuses/Orbits: Clear sinuses.  Unremarkable orbits.

Other: Small bilateral mastoid effusions.

MRA HEAD FINDINGS

Moderate to severely motion degraded study. This precludes adequate
evaluation for, and accurate quantification of, intracranial
arterial stenoses. This also precludes adequateevaluation for
intracranial aneurysms.

Anterior circulation: Bilateral intracranial ICAs are patent.
Bilateral M1 MCAs are patent. Limited evaluation of the more distal
MCA branches with some vascular flow related signal bilaterally.
Bilateral A1 ACAs appear to be grossly patent. A2 ACA evaluation is
essentially nondiagnostic due to extensive motion.

Posterior circulation: Bilateral intradural vertebral arteries,
basilar artery and proximal posterior cerebral arteries are patent.

MRA NECK FINDINGS

Motion limited study.

Aortic arch: Great vessel origins are patent.

Right carotid system: Patent. Probable mild-to-moderate stenosis of
the mid right ICA.

Left carotid system: Patent.  No evidence of high-grade stenosis.

Vertebral arteries: Patent.  No evidence of high-grade stenosis.
IMPRESSION: MRI:

1. No evidence of acute intracranial abnormality on this motion
limited exam. Specifically, no acute infarct.
2. Moderate chronic microvascular ischemic disease with small remote
infarcts in the right basal ganglia, left frontal white matter and
left cerebellum.
3. Prior microhemorrhages in the right thalamus and left cerebellum,
potentially hypertensive given location and the patient's known
history of hypertension.

MRA Head:

1. No evidence of large vessel occlusion.
2. Moderate to severely motion degraded study. This precludes
adequate evaluation for, and accurate quantification of,
intracranial arterial stenoses. This also precludes
adequateevaluation for intracranial aneurysms. A CTA may be able to
better characterize if clinically indicated.

MRA Neck:

1. Motion limited study with patent major arteries in the neck
2. Probable mild-to-moderate stenosis of the mid right cervical ICA.

## 2020-11-22 IMAGING — MR MR MRA NECK WO/W CM
9 of 13 series · 40 of 48 positions shown · IV contrast (Contrast agent)
Comparison: CT [DATE].

CLINICAL DATA: cva; Stroke, follow up

EXAM:
MRI HEAD WITHOUT CONTRAST
MRA HEAD WITHOUT CONTRAST
MRA NECK WITHOUT AND WITH CONTRAST
TECHNIQUE: Multiplanar, multi-echo pulse sequences of the brain and surrounding
structures were acquired without intravenous contrast. Angiographic
images of the Circle of Willis were acquired using MRA technique
without intravenous contrast. Angiographic images of the neck were
acquired using MRA technique without and with intravenous contrast.
Carotid stenosis measurements (when applicable) are obtained
utilizing NASCET criteria, using the distal internal carotid
diameter as the denominator.
CONTRAST:  6.9mL GADAVIST GADOBUTROL 1 MMOL/ML IV SOLN

[Series 15: tof_fl3d_tra_iso · axial · 0.6mm · 0.52mm/px · z∈[-243,-165]mm · 8 of 133 slices shown]
[im 1/133]
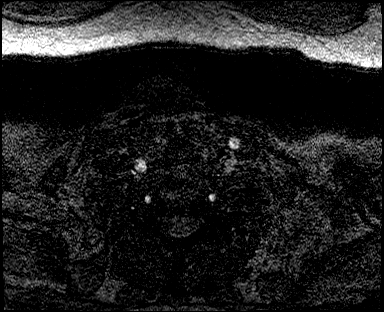
[im 19/133]
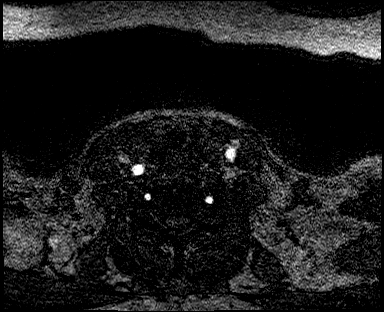
[im 38/133]
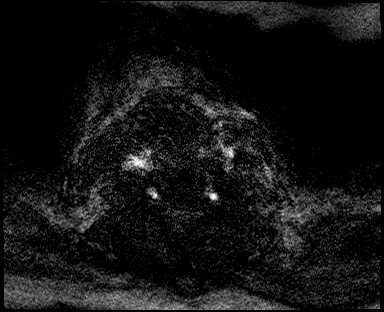
[im 57/133]
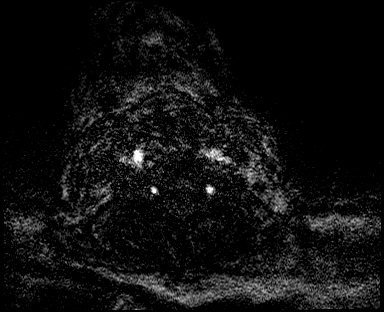
[im 76/133]
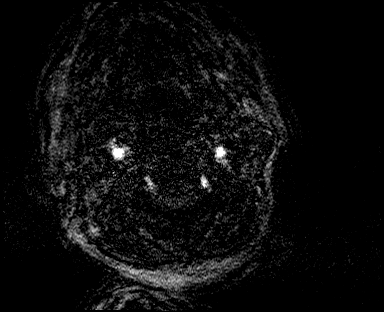
[im 95/133]
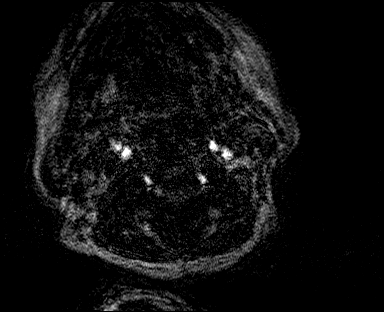
[im 114/133]
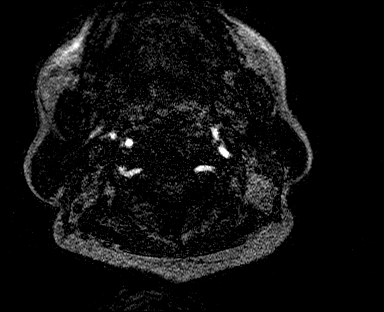
[im 133/133]
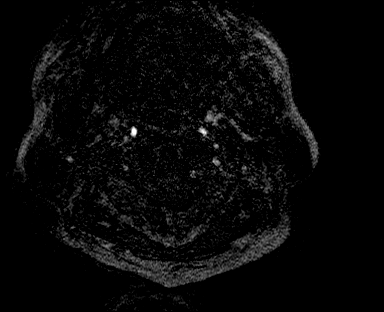

[Series 18: angio_fl3d_cor_pre_ttc=3.0s · coronal · 0.9mm · 0.85mm/px · 5 of 80 slices shown]
[im 1/80]
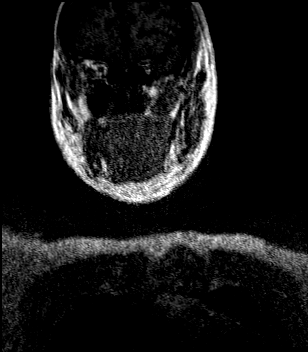
[im 20/80]
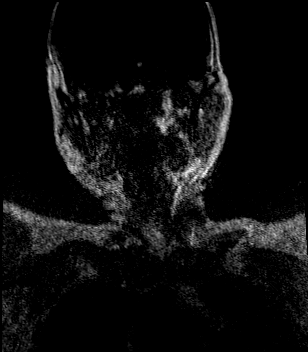
[im 40/80]
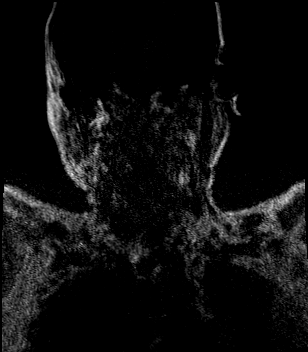
[im 60/80]
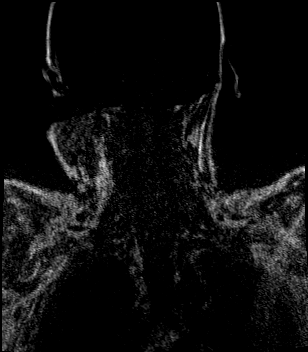
[im 80/80]
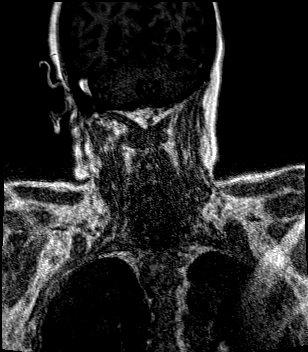

[Series 19: angio_fl3d_cor_pre_ttc=3.0s_moco-adv · coronal · 0.9mm · 0.85mm/px · 4 of 80 slices shown]
[im 1/80]
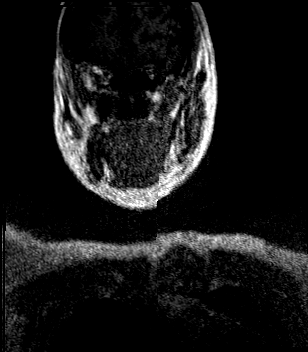
[im 27/80]
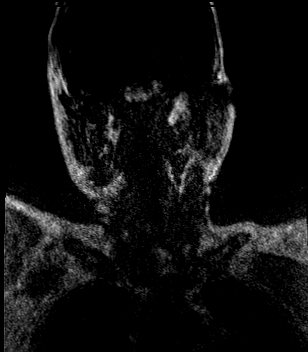
[im 53/80]
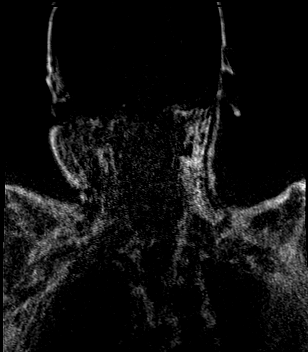
[im 80/80]
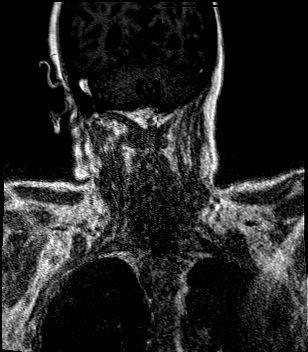

[Series 20: angio_fl3d_cor_pre_ttc=3.0s_moco-adv_sub · coronal · 0.9mm · 0.85mm/px · 4 of 80 slices shown]
[im 1/80]
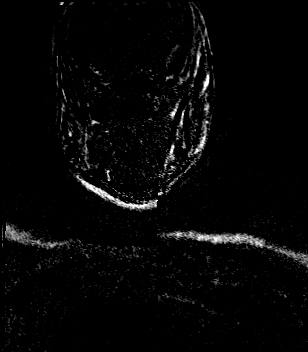
[im 27/80]
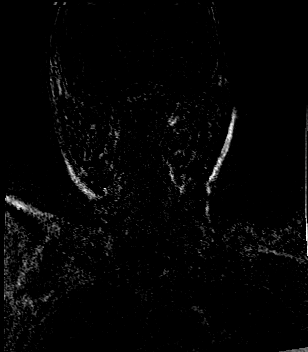
[im 53/80]
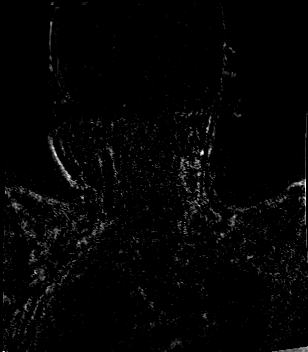
[im 80/80]
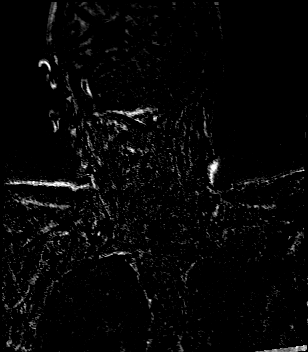

[Series 23: angio_fl3d_cor_post_ttc=3.0s · coronal · 0.9mm · 0.85mm/px · 4 of 80 slices shown (1 of 2)]
[im 1/80]
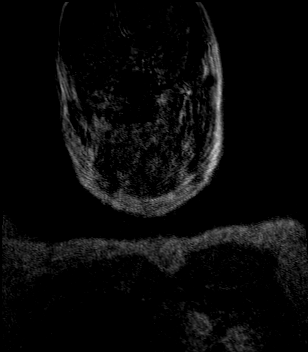
[im 27/80]
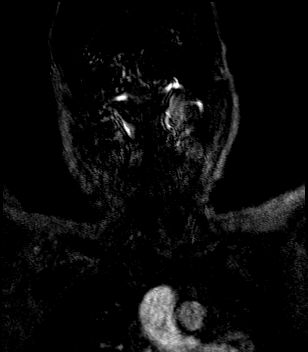
[im 53/80]
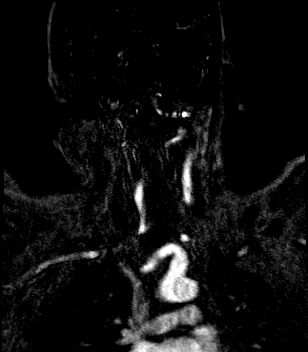
[im 80/80]
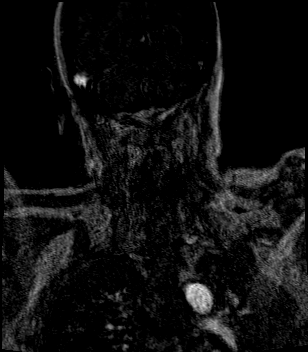

[Series 24: angio_fl3d_cor_post_ttc=3.0s_moco-adv · coronal · 0.9mm · 0.85mm/px · 4 of 80 slices shown (1 of 2)]
[im 1/80]
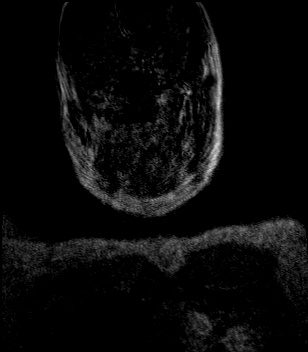
[im 27/80]
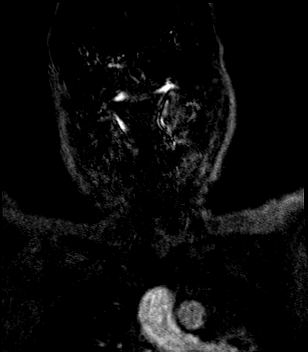
[im 53/80]
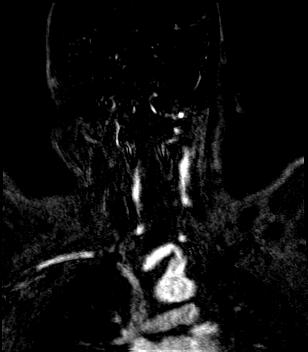
[im 80/80]
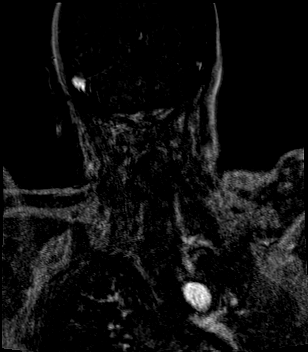

[Series 25: angio_fl3d_cor_post_ttc=3.0s_moco-adv_sub · coronal · 0.9mm · 0.85mm/px · 4 of 80 slices shown]
[im 1/80]
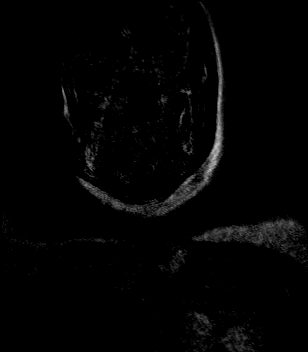
[im 27/80]
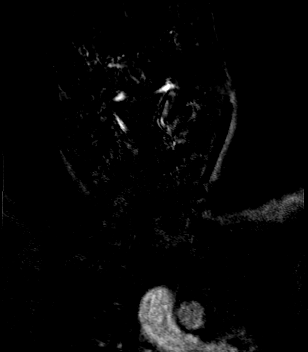
[im 53/80]
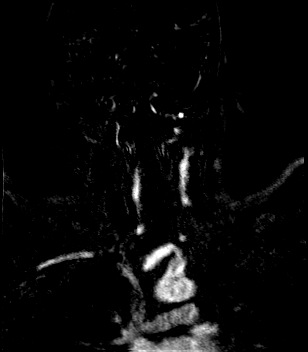
[im 80/80]
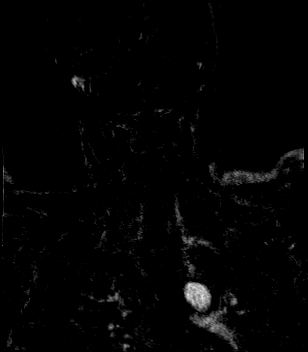

[Series 27: angio_fl3d_cor_post_ttc=3.0s · coronal · 0.9mm · 0.85mm/px · 4 of 80 slices shown (2 of 2)]
[im 1/80]
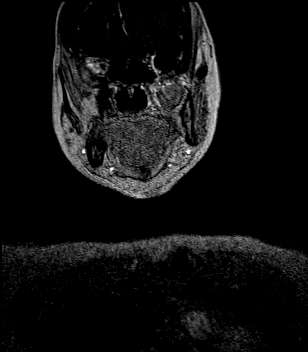
[im 27/80]
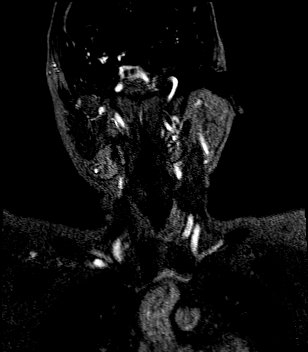
[im 53/80]
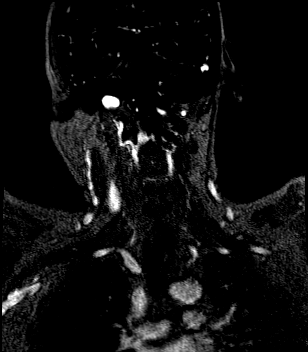
[im 80/80]
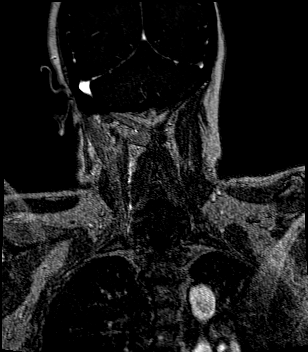

[Series 28: angio_fl3d_cor_post_ttc=3.0s_moco-adv · coronal · 0.9mm · 0.85mm/px · 3 of 80 slices shown (2 of 2)]
[im 1/80]
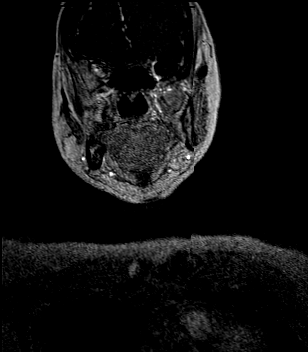
[im 27/80]
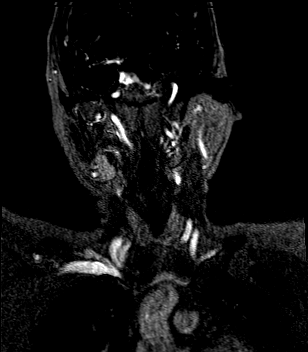
[im 53/80]
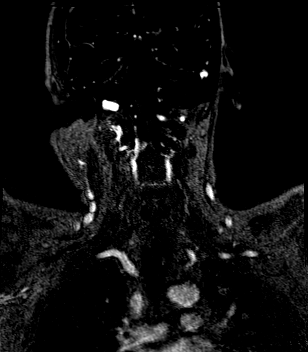

[40 of 48 positions shown; findings below may reference images not displayed]

FINDINGS: MRI HEAD FINDINGS
Motion limited study.  Within this limitation:

Brain: No acute infarction, hemorrhage, hydrocephalus, extra-axial
collection or mass lesion. No pathologic intracranial enhancement.
Moderate scattered T2 hyperintensities within the supraventricular
and pontine white matter, nonspecific but most likely secondary to
chronic microvascular ischemic disease given the patient's known
risk factors (including diabetes and hypertension). Small remote
infarcts in the right basal ganglia, left frontal white matter and
left cerebellum. Small focus of susceptibility artifact within the
left cerebellum and right thalamus, likely the sequela of prior
microhemorrhage.

Vascular: See below.

Skull and upper cervical spine: Normal marrow signal.

Sinuses/Orbits: Clear sinuses.  Unremarkable orbits.

Other: Small bilateral mastoid effusions.

MRA HEAD FINDINGS

Moderate to severely motion degraded study. This precludes adequate
evaluation for, and accurate quantification of, intracranial
arterial stenoses. This also precludes adequateevaluation for
intracranial aneurysms.

Anterior circulation: Bilateral intracranial ICAs are patent.
Bilateral M1 MCAs are patent. Limited evaluation of the more distal
MCA branches with some vascular flow related signal bilaterally.
Bilateral A1 ACAs appear to be grossly patent. A2 ACA evaluation is
essentially nondiagnostic due to extensive motion.

Posterior circulation: Bilateral intradural vertebral arteries,
basilar artery and proximal posterior cerebral arteries are patent.

MRA NECK FINDINGS

Motion limited study.

Aortic arch: Great vessel origins are patent.

Right carotid system: Patent. Probable mild-to-moderate stenosis of
the mid right ICA.

Left carotid system: Patent.  No evidence of high-grade stenosis.

Vertebral arteries: Patent.  No evidence of high-grade stenosis.
IMPRESSION: MRI:

1. No evidence of acute intracranial abnormality on this motion
limited exam. Specifically, no acute infarct.
2. Moderate chronic microvascular ischemic disease with small remote
infarcts in the right basal ganglia, left frontal white matter and
left cerebellum.
3. Prior microhemorrhages in the right thalamus and left cerebellum,
potentially hypertensive given location and the patient's known
history of hypertension.

MRA Head:

1. No evidence of large vessel occlusion.
2. Moderate to severely motion degraded study. This precludes
adequate evaluation for, and accurate quantification of,
intracranial arterial stenoses. This also precludes
adequateevaluation for intracranial aneurysms. A CTA may be able to
better characterize if clinically indicated.

MRA Neck:

1. Motion limited study with patent major arteries in the neck
2. Probable mild-to-moderate stenosis of the mid right cervical ICA.

## 2020-11-22 IMAGING — MR MR MRA HEAD W/O CM
1 series · 17 of 48 positions shown · IV contrast (gadavist)
Comparison: CT [DATE].

CLINICAL DATA: cva; Stroke, follow up

EXAM:
MRI HEAD WITHOUT CONTRAST
MRA HEAD WITHOUT CONTRAST
MRA NECK WITHOUT AND WITH CONTRAST
TECHNIQUE: Multiplanar, multi-echo pulse sequences of the brain and surrounding
structures were acquired without intravenous contrast. Angiographic
images of the Circle of Willis were acquired using MRA technique
without intravenous contrast. Angiographic images of the neck were
acquired using MRA technique without and with intravenous contrast.
Carotid stenosis measurements (when applicable) are obtained
utilizing NASCET criteria, using the distal internal carotid
diameter as the denominator.
CONTRAST:  6.9mL GADAVIST GADOBUTROL 1 MMOL/ML IV SOLN

[Series 5: 3d cow · axial · 0.5mm · 0.41mm/px · z∈[-159,-78]mm · 17 of 172 slices shown]
[im 1/172]
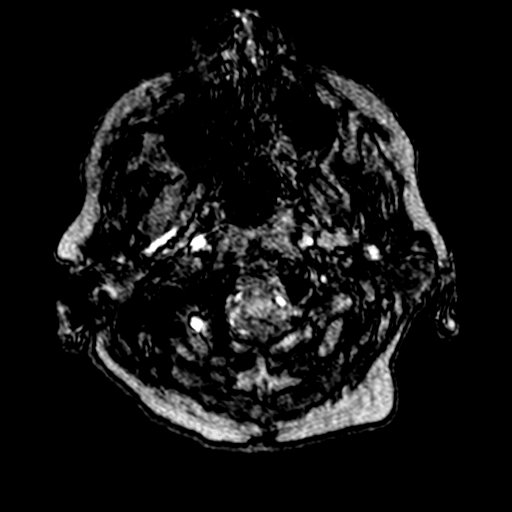
[im 4/172]
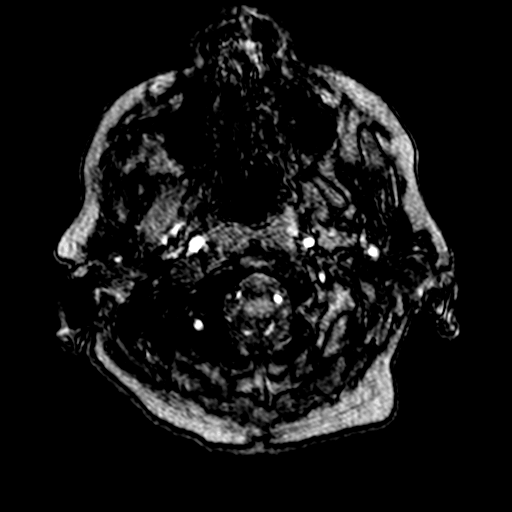
[im 8/172]
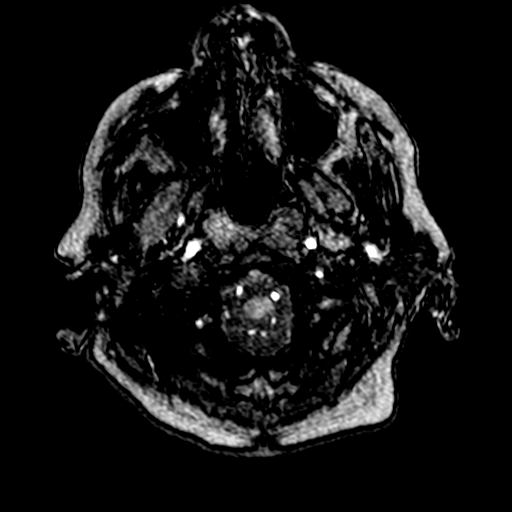
[im 11/172]
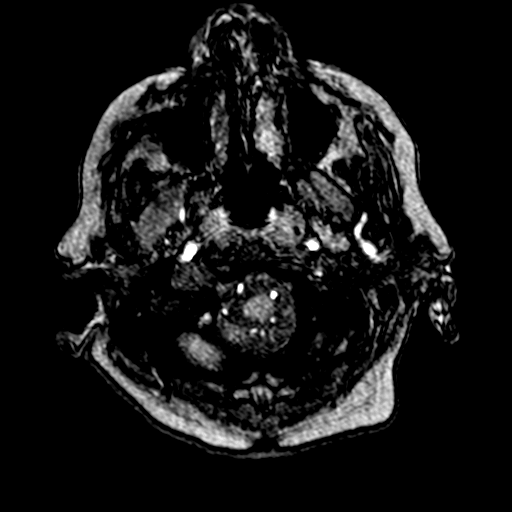
[im 15/172]
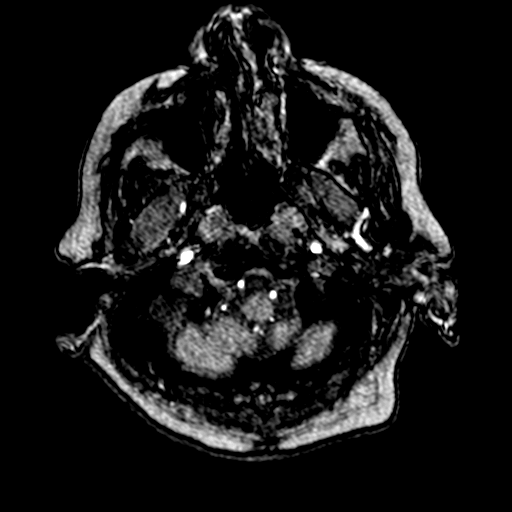
[im 19/172]
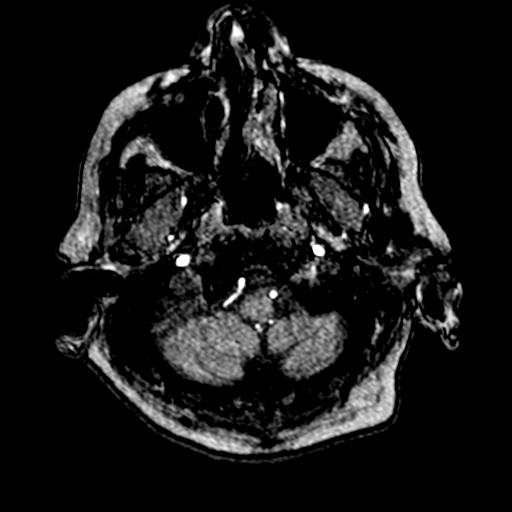
[im 22/172]
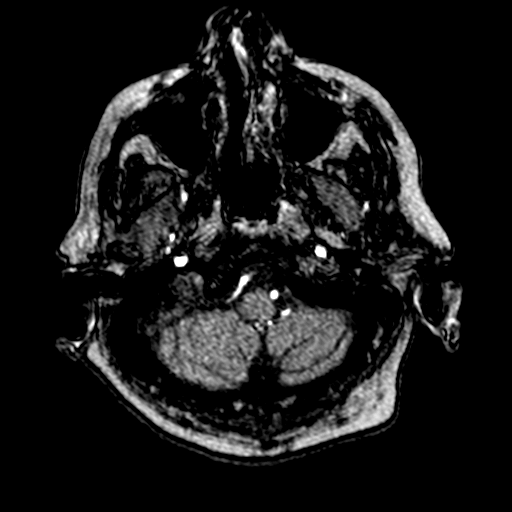
[im 30/172]
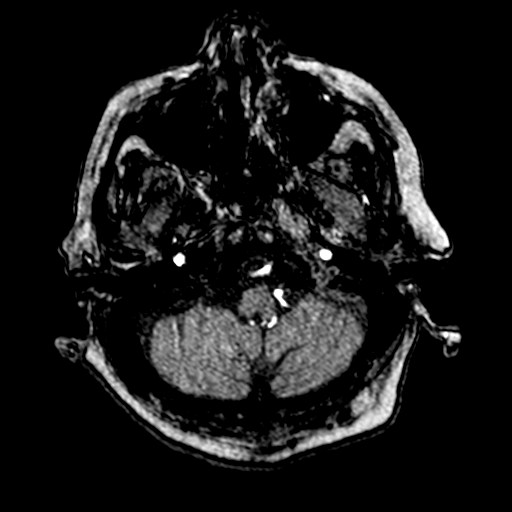
[im 33/172]
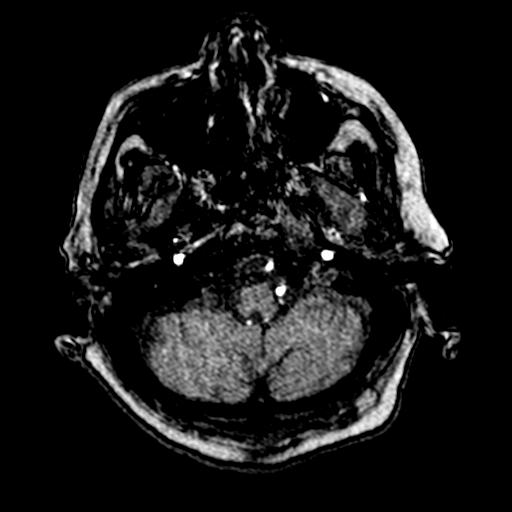
[im 55/172]
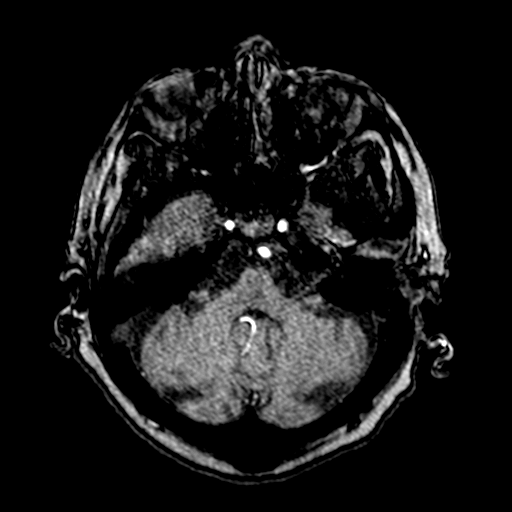
[im 77/172]
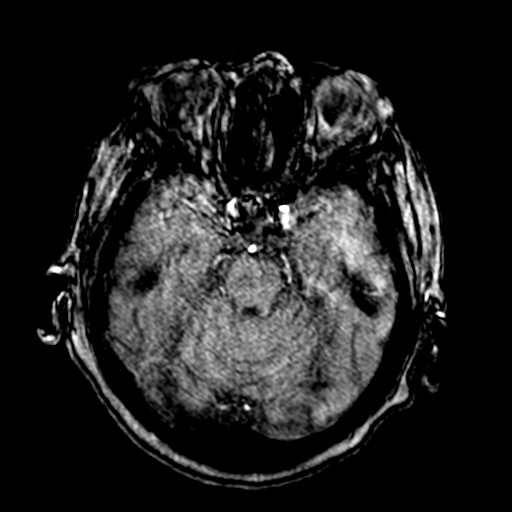
[im 88/172]
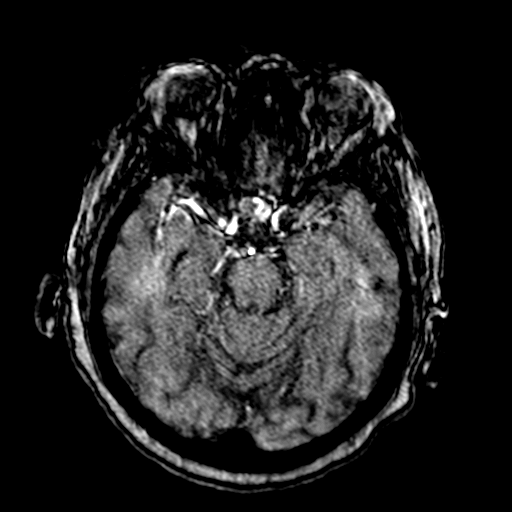
[im 99/172]
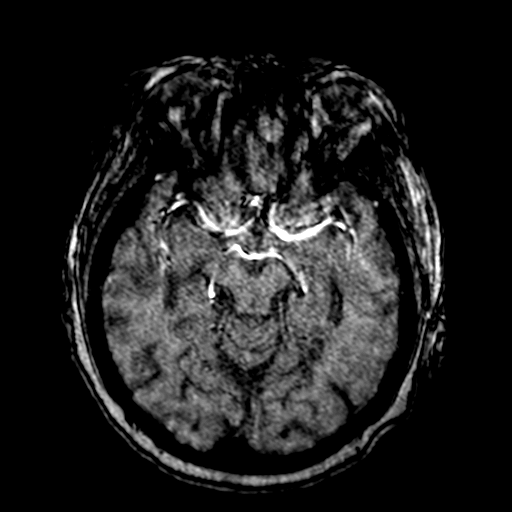
[im 121/172]
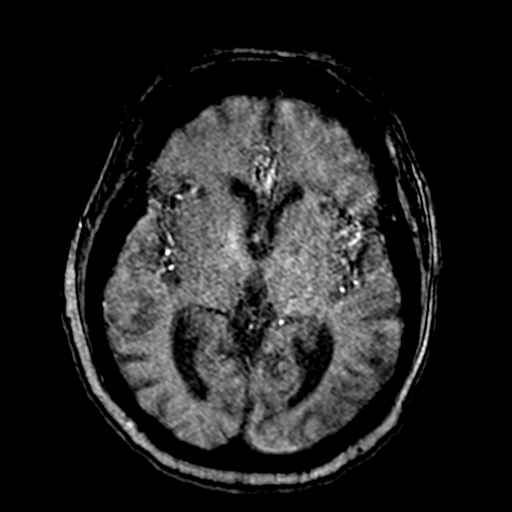
[im 142/172]
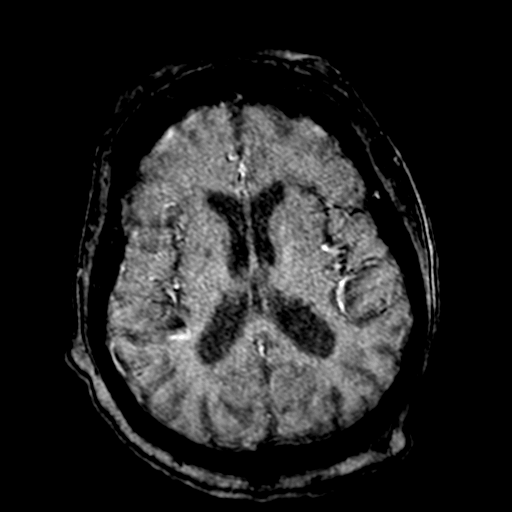
[im 146/172]
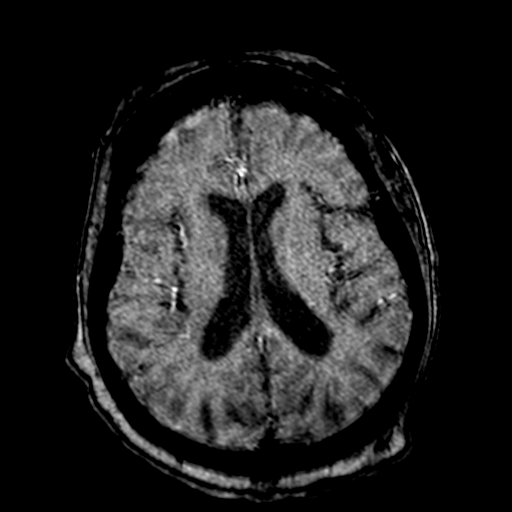
[im 164/172]
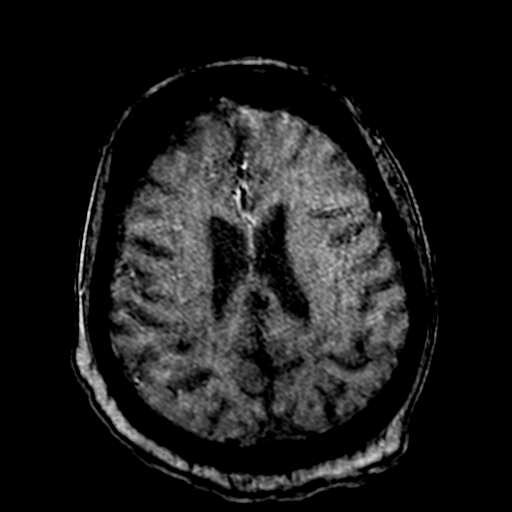

[17 of 48 positions shown; findings below may reference images not displayed]

FINDINGS: MRI HEAD FINDINGS
Motion limited study.  Within this limitation:

Brain: No acute infarction, hemorrhage, hydrocephalus, extra-axial
collection or mass lesion. No pathologic intracranial enhancement.
Moderate scattered T2 hyperintensities within the supraventricular
and pontine white matter, nonspecific but most likely secondary to
chronic microvascular ischemic disease given the patient's known
risk factors (including diabetes and hypertension). Small remote
infarcts in the right basal ganglia, left frontal white matter and
left cerebellum. Small focus of susceptibility artifact within the
left cerebellum and right thalamus, likely the sequela of prior
microhemorrhage.

Vascular: See below.

Skull and upper cervical spine: Normal marrow signal.

Sinuses/Orbits: Clear sinuses.  Unremarkable orbits.

Other: Small bilateral mastoid effusions.

MRA HEAD FINDINGS

Moderate to severely motion degraded study. This precludes adequate
evaluation for, and accurate quantification of, intracranial
arterial stenoses. This also precludes adequateevaluation for
intracranial aneurysms.

Anterior circulation: Bilateral intracranial ICAs are patent.
Bilateral M1 MCAs are patent. Limited evaluation of the more distal
MCA branches with some vascular flow related signal bilaterally.
Bilateral A1 ACAs appear to be grossly patent. A2 ACA evaluation is
essentially nondiagnostic due to extensive motion.

Posterior circulation: Bilateral intradural vertebral arteries,
basilar artery and proximal posterior cerebral arteries are patent.

MRA NECK FINDINGS

Motion limited study.

Aortic arch: Great vessel origins are patent.

Right carotid system: Patent. Probable mild-to-moderate stenosis of
the mid right ICA.

Left carotid system: Patent.  No evidence of high-grade stenosis.

Vertebral arteries: Patent.  No evidence of high-grade stenosis.
IMPRESSION: MRI:

1. No evidence of acute intracranial abnormality on this motion
limited exam. Specifically, no acute infarct.
2. Moderate chronic microvascular ischemic disease with small remote
infarcts in the right basal ganglia, left frontal white matter and
left cerebellum.
3. Prior microhemorrhages in the right thalamus and left cerebellum,
potentially hypertensive given location and the patient's known
history of hypertension.

MRA Head:

1. No evidence of large vessel occlusion.
2. Moderate to severely motion degraded study. This precludes
adequate evaluation for, and accurate quantification of,
intracranial arterial stenoses. This also precludes
adequateevaluation for intracranial aneurysms. A CTA may be able to
better characterize if clinically indicated.

MRA Neck:

1. Motion limited study with patent major arteries in the neck
2. Probable mild-to-moderate stenosis of the mid right cervical ICA.

## 2020-11-22 IMAGING — DX DG ABD PORTABLE 1V
1 series · 1 of 1 positions shown · non-contrast
Comparison: Portable pelvis dated [DATE]

CLINICAL DATA: Pre MRI screening procedure.

EXAM:
PORTABLE ABDOMEN - 1 VIEW

[abdomen supine]
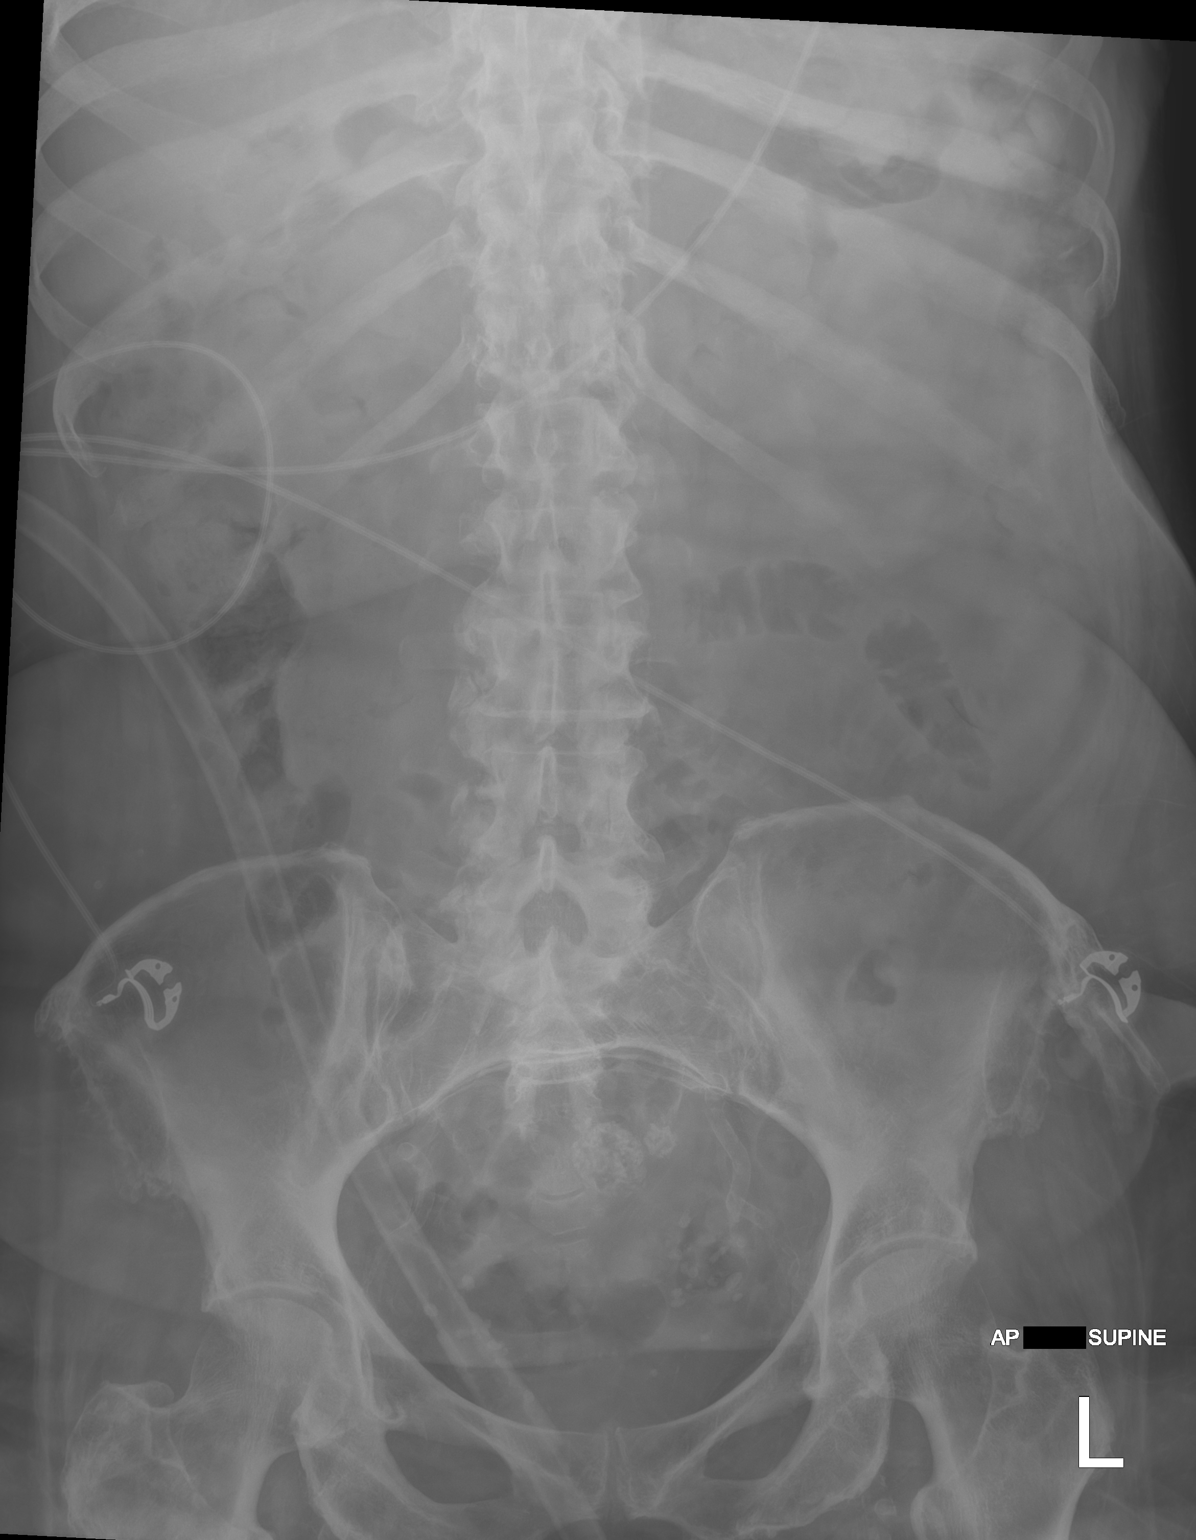

[1 of 1 positions shown; findings below may reference images not displayed]

FINDINGS: Normal bowel gas pattern. Central pelvic calcifications compatible
with a small degenerated uterine fibroids. Lumbar and lower thoracic
spine degenerative changes. No metallic foreign bodies are seen.
IMPRESSION: No acute abnormality.  No metallic foreign bodies.

## 2020-11-22 IMAGING — DX DG CHEST 1V PORT
1 series · 1 of 1 positions shown · non-contrast
Comparison: [DATE]

CLINICAL DATA: Pre MRI screening procedure.

EXAM:
PORTABLE CHEST 1 VIEW

[chest ap]
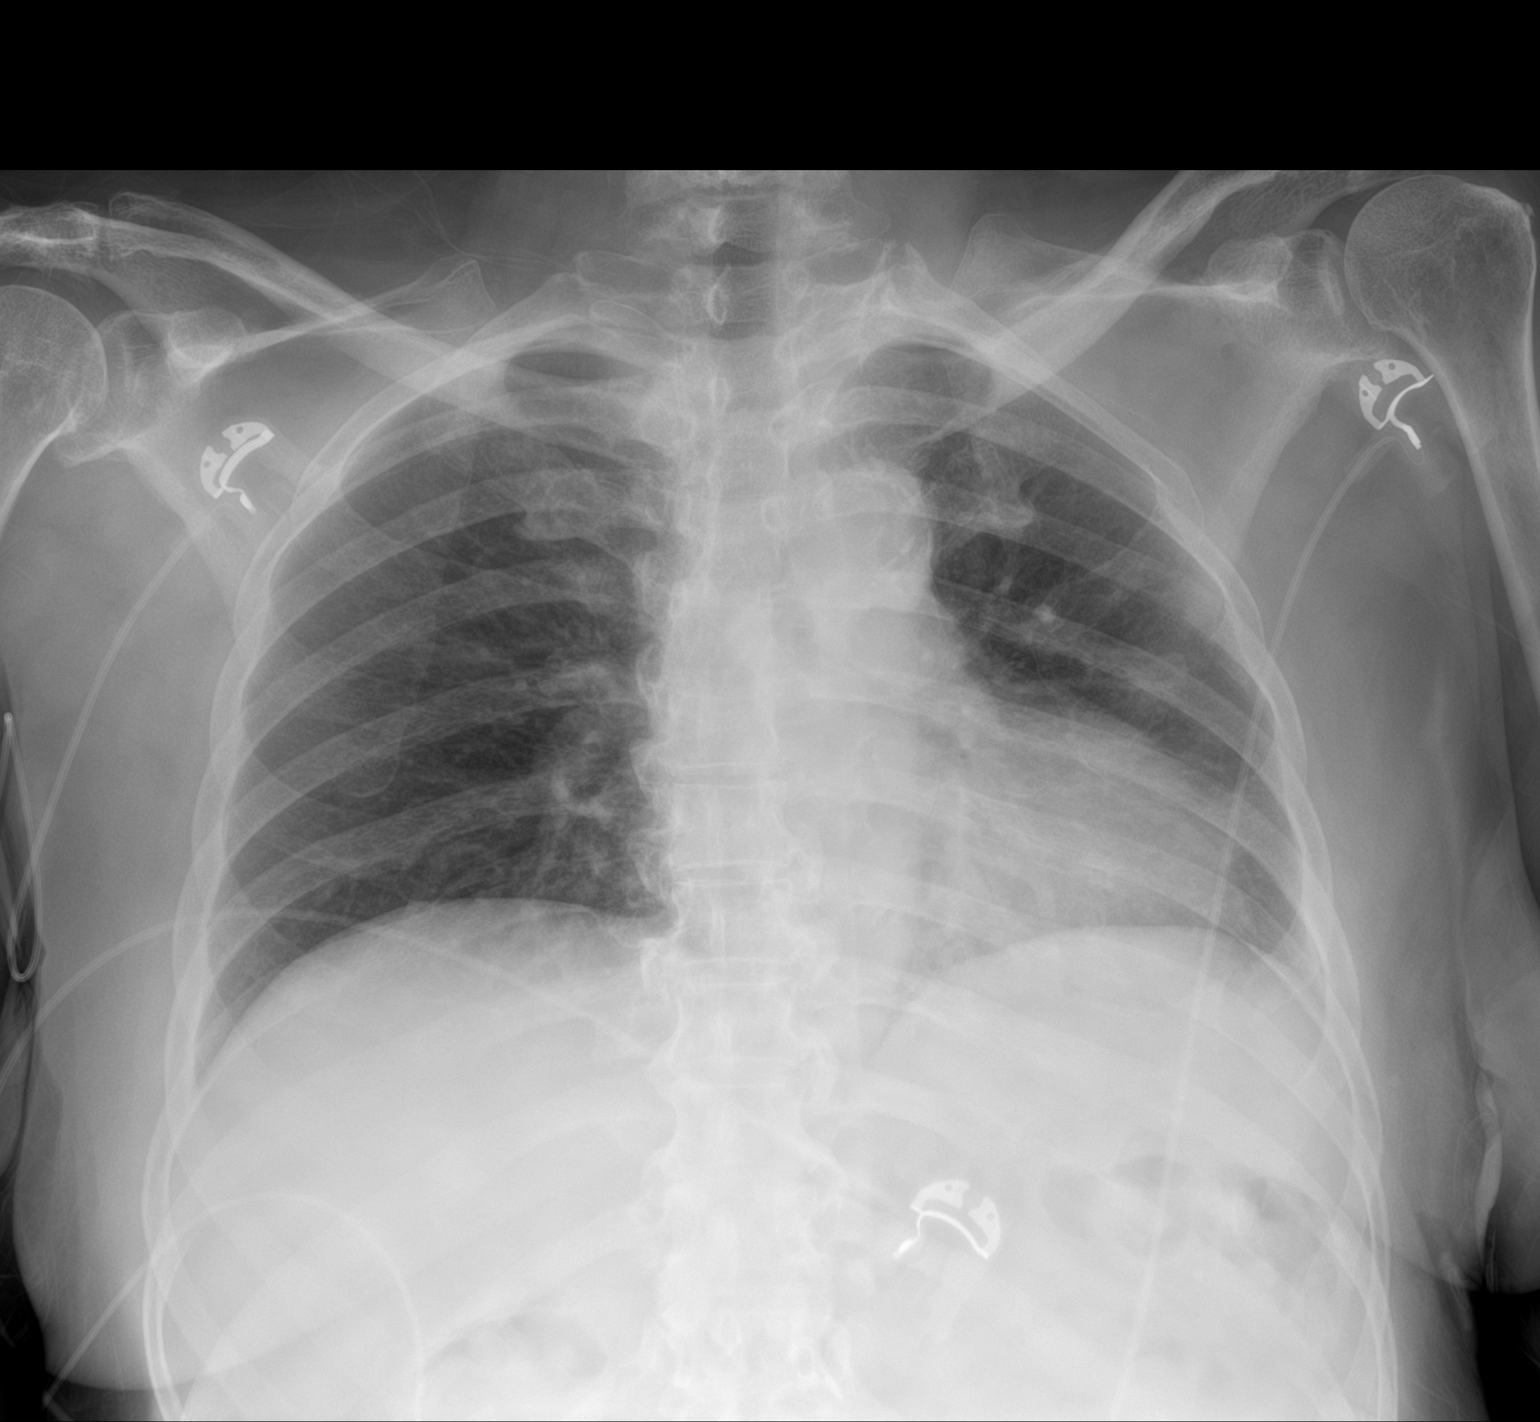

[1 of 1 positions shown; findings below may reference images not displayed]

FINDINGS: Poor inspiration. Normal sized heart. Clear lungs. Thoracic spine
degenerative changes. No metallic foreign bodies.
IMPRESSION: No acute abnormality.  No metallic foreign bodies.

## 2020-11-22 MED ORDER — DEXTROSE 50 % IV SOLN: 50.0000 mL | Freq: Once | INTRAVENOUS | Status: AC

## 2020-11-22 MED ORDER — GADOBUTROL 1 MMOL/ML IV SOLN
6.9000 mL | Freq: Once | INTRAVENOUS | Status: AC | PRN
  Administered 2020-11-22: 6.9 mL via INTRAVENOUS

## 2020-11-22 MED ORDER — DEXTROSE IN LACTATED RINGERS 5 % IV SOLN: INTRAVENOUS | Status: DC

## 2020-11-22 MED ORDER — DEXTROSE 50 % IV SOLN
INTRAVENOUS | Status: AC
  Administered 2020-11-22: 50 mL via INTRAVENOUS
  Filled 2020-11-22: qty 50

## 2020-11-22 MED ORDER — LACTATED RINGERS IV BOLUS
500.0000 mL | Freq: Once | INTRAVENOUS | Status: AC
  Administered 2020-11-22: 500 mL via INTRAVENOUS

## 2020-11-22 MED ORDER — PANTOPRAZOLE SODIUM 40 MG IV SOLR
40.0000 mg | Freq: Two times a day (BID) | INTRAVENOUS | Status: DC
  Administered 2020-11-22 – 2020-11-25 (×6): 40 mg via INTRAVENOUS
  Filled 2020-11-22 (×6): qty 40

## 2020-11-22 NOTE — Progress Notes (Signed)
EEG complete - results pending 

## 2020-11-22 NOTE — TOC Initial Note (Signed)
Transition of Care [facility]) - Initial/Assessment Note    Patient Details  Name: Amanda Fritz MRN: [id number] Date of Birth: 14-Jan-1956  Transition of Care [facility]) CM/SW Contact:    Carley Hammed, LCSWA Phone Number: 11/22/2020, 3:09 PM  Clinical Narrative:                 CSW noted that pt is disoriented and spoke with daughter regarding discharge plans. Daughter noted that pt has been at [facility] (formerly [facility]) since December of 2020 and would like for her to return at discharge. CSW will complete FL2 and send to [facility]. TOC will continue to follow for further needs.   Expected Discharge Plan: Skilled Nursing Facility Barriers to Discharge: Continued Medical Work up   Patient Goals and CMS Choice Patient states their goals for this hospitalization and ongoing recovery are:: Pt unable to participate in goal setting due to disorientation. CMS Medicare.gov Compare Post Acute Care list provided to:: Patient Represenative (must comment) Choice offered to / list presented to : Adult Children  Expected Discharge Plan and Services Expected Discharge Plan: Skilled Nursing Facility     Post Acute Care Choice: Skilled Nursing Facility Living arrangements for the past 2 months: Skilled Nursing Facility                                      Prior Living Arrangements/Services Living arrangements for the past 2 months: Skilled Nursing Facility Lives with:: Facility Resident Patient language and need for interpreter reviewed:: Yes Do you feel safe going back to the place where you live?: Yes      Need for Family Participation in Patient Care: Yes (Comment) Care giver support system in place?: Yes (comment)   Criminal Activity/Legal Involvement Pertinent to Current Situation/Hospitalization: No - Comment as needed  Activities of Daily Living      Permission Sought/Granted Permission sought to share information with : Family Supports Permission granted  to share information with : Yes, Verbal Permission Granted  Share Information with NAME: Julianne Rice     Permission granted to share info w Relationship: Daughter  Permission granted to share info w Contact Information: [phone]  Emotional Assessment Appearance:: Appears stated age Attitude/Demeanor/Rapport: Unable to Assess Affect (typically observed): Unable to Assess Orientation: : Oriented to Self Alcohol / Substance Use: Not Applicable Psych Involvement: No (comment)  Admission diagnosis:  Seizure (HCC) [R56.9] Left-sided weakness [R53.1] Patient Active Problem List   Diagnosis Date Noted   Left-sided weakness 11/21/2020   Dyslipidemia 05/10/2020   Acute postoperative pain 03/02/2019   Itching due to drug 03/02/2019   Postoperative anemia 03/02/2019   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 [place] [address] Phone: [phone] Fax: [phone]     Social Determinants of Health (SDOH) Interventions    Readmission Risk Interventions No flowsheet data found.

## 2020-11-22 NOTE — NC FL2 (Signed)
[place] MEDICAID FL2 LEVEL OF CARE SCREENING TOOL     IDENTIFICATION  Patient Name: Amanda Fritz Birthdate: [birth year] Sex: female Admission Date (Current Location): 11/21/2020  [facility] [place] Number:  Producer, television/film/video and Address:  [facility] [place]. [facility], [address], [place], [place] [address]      Provider Number: [id number]  Attending Physician Name and Address:  Alba Cory, MD  Relative Name and Phone Number:  Julianne Rice, [phone]    Current Level of Care: SNF Recommended Level of Care: Skilled Nursing Facility Prior Approval Number:    Date Approved/Denied:   PASRR Number: [id number]  Discharge Plan: SNF    Current Diagnoses: Patient Active Problem List   Diagnosis Date Noted   Left-sided weakness 11/21/2020   Dyslipidemia 05/10/2020   Acute postoperative pain 03/02/2019   Itching due to drug 03/02/2019   Postoperative anemia 03/02/2019   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   Diabetes mellitus, type 2 (HCC) 02/18/2019   H/O fall 02/18/2019   History of depression 02/18/2019   Hx of subdural hematoma 02/18/2019   Hypertension, uncontrolled 02/18/2019   CAD, 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/2019   Dysphagia 12/11/2018   Headache 12/11/2018   SDH (subdural hematoma) (HCC) 12/04/2018   Focal neurological deficit 08/16/2017   Confusion 07/06/2017   History of stroke 07/06/2017   Loss of memory 07/06/2017    Orientation RESPIRATION BLADDER Height & Weight     Self, Place  Normal Incontinent Weight: 153 lb (69.4 kg) Height:  5\' 3"  (160 cm)  BEHAVIORAL SYMPTOMS/MOOD NEUROLOGICAL BOWEL NUTRITION STATUS      Incontinent Diet (See DC summary)  AMBULATORY STATUS COMMUNICATION OF NEEDS Skin   Extensive Assist Non-Verbally Normal                       Personal Care Assistance Level of Assistance  Bathing, Feeding, Dressing Bathing Assistance: Maximum assistance Feeding assistance: Limited assistance Dressing Assistance: Maximum  assistance     Functional Limitations Info  Sight, Hearing, Speech Sight Info: Impaired Hearing Info: Adequate Speech Info:  (UTA)    SPECIAL CARE FACTORS FREQUENCY  PT (By licensed PT), OT (By licensed OT)     PT Frequency: 5x week OT Frequency: 5x week            Contractures Contractures Info: Not present    Additional Factors Info  Code Status, Allergies, Psychotropic, Insulin Sliding Scale Code Status Info: Full Allergies Info: Ace Inhibitors   Bactrim (Sulfamethoxazole-trimethoprim)   Metformin   Neurontin (Gabapentin)   Sulfamethoxazole   Trimethoprim   Aspirin Psychotropic Info: Donzepil, Sertraline Insulin Sliding Scale Info: Insulin Aspart Novolog 0-9 U 3x with meals       Current Medications (11/22/2020):  This is the current hospital active medication list Current Facility-Administered Medications  Medication Dose Route Frequency Provider Last Rate Last Admin   acetaminophen (TYLENOL) tablet 650 mg  650 mg Oral Q4H PRN 01/22/2021, MD       Or   acetaminophen (TYLENOL) 160 MG/5ML solution 650 mg  650 mg Per Tube Q4H PRN Floydene Flock, MD       Or   acetaminophen (TYLENOL) suppository 650 mg  650 mg Rectal Q4H PRN Floydene Flock, MD   650 mg at 11/22/20 0354   aspirin EC tablet 81 mg  81 mg Oral QPM 01/22/21, MD       atorvastatin (LIPITOR) tablet 80 mg  80  mg Oral q1800 Floydene Flock, MD       carbidopa-levodopa (SINEMET IR) 25-100 MG per tablet immediate release 1.5 tablet  1.5 tablet Oral TID Floydene Flock, MD       [START ON 12/03/2020] cyanocobalamin ((VITAMIN B-12)) injection 1,000 mcg  1,000 mcg Intramuscular Q30 days Floydene Flock, MD       dextrose 5 % in lactated ringers infusion   Intravenous Continuous Regalado, Belkys A, MD 75 mL/hr at 11/22/20 1411 New Bag at 11/22/20 1411   donepezil (ARICEPT) tablet 10 mg  10 mg Oral QHS Floydene Flock, MD       enoxaparin (LOVENOX) injection 40 mg  40 mg Subcutaneous Q24H Floydene Flock, MD   40 mg at 11/21/20 1735   insulin aspart (novoLOG) injection 0-9 Units  0-9 Units Subcutaneous TID WC Floydene Flock, MD       metoprolol tartrate (LOPRESSOR) tablet 50 mg  50 mg Oral BID Floydene Flock, MD       mirtazapine (REMERON) tablet 7.5 mg  7.5 mg Oral QHS Floydene Flock, MD       nitroGLYCERIN (NITROSTAT) SL tablet 0.4 mg  0.4 mg Sublingual Q5 min PRN Floydene Flock, MD       pantoprazole (PROTONIX) injection 40 mg  40 mg Intravenous Q12H Regalado, Belkys A, MD       potassium chloride (KLOR-CON) CR tablet 40 mEq  40 mEq Oral Daily Floydene Flock, MD       senna (SENOKOT) tablet 8.6 mg  1 tablet Oral QPM Floydene Flock, MD       senna-docusate (Senokot-S) tablet 1 tablet  1 tablet Oral QHS PRN Floydene Flock, MD       sertraline (ZOLOFT) tablet 100 mg  100 mg Oral Daily Floydene Flock, MD         Discharge Medications: Please see discharge summary for a list of discharge medications.  Relevant Imaging Results:  Relevant Lab Results:   Additional Information SS# [id number] [place]
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
## 2020-11-22 NOTE — Progress Notes (Addendum)
PROGRESS NOTE    Amanda Fritz  ZOX:[id number] DOB: Nov 27, 1955 DOA: 11/21/2020 PCP: Galvin Proffer, MD   Brief Narrative: 65 year old with past medical history significant for asthma, anxiety, type 2 diabetes, fibromyalgia, CAD, history of CVA, hypertension, neuropathy, history of subdural hematoma, Parkinson's presented with seizure-like activity and left-sided weakness.  Neurology was consulted recommended work-up for TIA or stroke and to rule out seizure.    Assessment & Plan:   Active Problems:   Left-sided weakness   Focal neurological deficit  1-Left-sided weakness/seizure-like activity: Patient seen this morning she was  sleepy, lethargic, she is able to say her name.  Fall  back to sleep. BG was checked (70) , blood pressure was in the 120s.   amp of D50 and IV fluids ordered.  I check on her later and she was more alert.  -MRI; no evidence of acute infarct, moderate chronic microvascular ischemic changes with small remote infarct in the right basal ganglia, left frontal white matter and left cerebellum.  Prior microhemorrhage in the right thalamus and left cerebellum. -MRA head: Moderate to severely motion degraded study.  No evidence of large vessel occlusion -MRA neck: Probably mild to moderate stenosis of the mid right cervical ICA. -LDL 27 and A1c 6.5. -B 12 811 -Plan for EEG.  -ECHO; normal EF, grade 1 diastolic dysfunction.   2-Parkinson's disease: -Continue with Sinemet  3-Dementia: -Continue with Aricept  4-Type 2 Diabetes: Hypoglycemia; started D 5 IV fluids.  Hold insulin. Patient currently n.p.o.  5-HLD;  Continue with statins 6-hypertension: 7-CAD: Continue with aspirin, as needed nitroglycerin. Per admitting MD patient complaining of chest pain.  Plan to check troponin 8-Epigastric pain: Start PPI.  9-History of B 12 deficiency;  Continue with B 12 supplement.  Repeat B 12 level.   Estimated body mass index is 27.1 kg/m as calculated from the  following:   Height as of this encounter: 5\' 3"  (1.6 m).   Weight as of this encounter: 69.4 kg.   DVT prophylaxis: Lovenox Code Status: Full Code Family Communication: Care discussed with daughter  Disposition Plan:  Status is: Inpatient  Remains inpatient appropriate because:IV treatments appropriate due to intensity of illness or inability to take PO  Dispo: The patient is from: SNF              Anticipated d/c is to: SNF              Patient currently is not medically stable to d/c.   Difficult to place patient No        Consultants:  Neurology   Procedures:  EEG ECHO   Antimicrobials:    Subjective: Patient lethargic this morning. Open eyes, fall back to sleep.  I came later and she was more alert, and answering some questions.   Objective: Vitals:   11/21/20 2046 11/22/20 0000 11/22/20 0400 11/22/20 0730  BP: 126/87 130/89 (!) 146/88 140/72  Pulse: 70 72 78 72  Resp: 16 16 16 16   Temp: 98.4 F (36.9 C) 98.6 F (37 C) 98.4 F (36.9 C) 98.1 F (36.7 C)  TempSrc: Oral Oral Oral Oral  SpO2: 100% 99% 98% 97%  Weight:      Height:        Intake/Output Summary (Last 24 hours) at 11/22/2020 0849 Last data filed at 11/22/2020 0629 Gross per 24 hour  Intake --  Output 125 ml  Net -125 ml   Filed Weights   11/21/20 1410  Weight: 69.4 kg  Examination:  General exam: Appears calm and comfortable  Respiratory system: Clear to auscultation. Respiratory effort normal. Cardiovascular system: S1 & S2 heard, RRR. No JVD, murmurs, rubs, gallops or clicks. No pedal edema. Gastrointestinal system: Abdomen is nondistended, soft and nontender. Central nervous system: sleepy  Extremities: no edema Data Reviewed: I have personally reviewed following labs and imaging studies  CBC: Recent Labs  Lab 11/21/20 1449 11/21/20 1505  WBC 7.0  --   NEUTROABS 5.3  --   HGB 10.3* 11.2*  HCT 32.6* 33.0*  MCV 78.7*  --   PLT 300  --    Basic Metabolic  Panel: Recent Labs  Lab 11/21/20 1449 11/21/20 1505  NA 135 138  K 3.6 3.8  CL 99 100  CO2 25  --   GLUCOSE 158* 161*  BUN 9 7*  CREATININE 0.63 0.50  CALCIUM 8.7*  --    GFR: Estimated Creatinine Clearance: 64.6 mL/min (by C-G formula based on SCr of 0.5 mg/dL). Liver Function Tests: Recent Labs  Lab 11/21/20 1449  AST 25  ALT 6  ALKPHOS 114  BILITOT 0.3  PROT 7.6  ALBUMIN 3.5   No results for input(s): LIPASE, AMYLASE in the last 168 hours. No results for input(s): AMMONIA in the last 168 hours. Coagulation Profile: Recent Labs  Lab 11/21/20 1449  INR 1.0   Cardiac Enzymes: No results for input(s): CKTOTAL, CKMB, CKMBINDEX, TROPONINI in the last 168 hours. BNP (last 3 results) No results for input(s): PROBNP in the last 8760 hours. HbA1C: No results for input(s): HGBA1C in the last 72 hours. CBG: Recent Labs  Lab 11/21/20 1423 11/21/20 2254 11/22/20 0345 11/22/20 0834  GLUCAP 148* 73 80 71   Lipid Profile: No results for input(s): CHOL, HDL, LDLCALC, TRIG, CHOLHDL, LDLDIRECT in the last 72 hours. Thyroid Function Tests: No results for input(s): TSH, T4TOTAL, FREET4, T3FREE, THYROIDAB in the last 72 hours. Anemia Panel: No results for input(s): VITAMINB12, FOLATE, FERRITIN, TIBC, IRON, RETICCTPCT in the last 72 hours. Sepsis Labs: No results for input(s): PROCALCITON, LATICACIDVEN in the last 168 hours.  Recent Results (from the past 240 hour(s))  Resp Panel by RT-PCR (Flu A&B, Covid) Nasopharyngeal Swab     Status: None   Collection Time: 11/21/20  5:02 PM   Specimen: Nasopharyngeal Swab; Nasopharyngeal(NP) swabs in vial transport medium  Result Value Ref Range Status   SARS Coronavirus 2 by RT PCR NEGATIVE NEGATIVE Final    Comment: (NOTE) SARS-CoV-2 target nucleic acids are NOT DETECTED.  The SARS-CoV-2 RNA is generally detectable in upper respiratory specimens during the acute phase of infection. The lowest concentration of SARS-CoV-2 viral  copies this assay can detect is 138 copies/mL. A negative result does not preclude SARS-Cov-2 infection and should not be used as the sole basis for treatment or other patient management decisions. A negative result may occur with  improper specimen collection/handling, submission of specimen other than nasopharyngeal swab, presence of viral mutation(s) within the areas targeted by this assay, and inadequate number of viral copies(<138 copies/mL). A negative result must be combined with clinical observations, patient history, and epidemiological information. The expected result is Negative.  Fact Sheet for Patients:  BloggerCourse.com  Fact Sheet for Healthcare Providers:  SeriousBroker.it  This test is no t yet approved or cleared by the [place] FDA and  has been authorized for detection and/or diagnosis of SARS-CoV-2 by FDA under an Emergency Use Authorization (EUA). This EUA will remain  in effect (meaning this test can  be used) for the duration of the COVID-19 declaration under Section 564(b)(1) of the Act, 21 U.S.C.section 360bbb-3(b)(1), unless the authorization is terminated  or revoked sooner.       Influenza A by PCR NEGATIVE NEGATIVE Final   Influenza B by PCR NEGATIVE NEGATIVE Final    Comment: (NOTE) The Xpert Xpress SARS-CoV-2/FLU/RSV plus assay is intended as an aid in the diagnosis of influenza from Nasopharyngeal swab specimens and should not be used as a sole basis for treatment. Nasal washings and aspirates are unacceptable for Xpert Xpress SARS-CoV-2/FLU/RSV testing.  Fact Sheet for Patients: BloggerCourse.com  Fact Sheet for Healthcare Providers: SeriousBroker.it  This test is not yet approved or cleared by the [place] FDA and has been authorized for detection and/or diagnosis of SARS-CoV-2 by FDA under an Emergency Use Authorization (EUA). This  EUA will remain in effect (meaning this test can be used) for the duration of the COVID-19 declaration under Section 564(b)(1) of the Act, 21 U.S.C. section 360bbb-3(b)(1), unless the authorization is terminated or revoked.  Performed at [facility], [address], [place], [place] [address]          Radiology Studies: CT HEAD CODE STROKE WO CONTRAST  Result Date: 11/21/2020 CLINICAL DATA:  Code stroke.  Neuro deficit, acute, stroke suspected EXAM: CT HEAD WITHOUT CONTRAST TECHNIQUE: Contiguous axial images were obtained from the base of the skull through the vertex without intravenous contrast. COMPARISON:  CT head 09/26/2020. FINDINGS: Brain: No evidence of acute large vascular territory infarction, hemorrhage, hydrocephalus, extra-axial collection or mass lesion/mass effect. Similar patchy white matter hypoattenuation, compatible with chronic microvascular ischemic disease. Probable remote lacunar infarcts in bilateral basal ganglia and right corona radiata, similar to priors Vascular: No hyperdense vessel identified. Skull: No acute fracture. Sinuses/Orbits: Clear sinuses.  Unremarkable orbits. Other: No mastoid effusions ASPECTS [facility] Stroke Program Early CT Score) total score (0-10 with 10 being normal): 10. IMPRESSION: 1. No evidence of acute large vascular territory infarct or acute hemorrhage. 2. ASPECTS is 10 3. 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 4. Partially empty sella, which is often a normal anatomic variant but can be associated with idiopathic intracranial hypertension. Findings discussed with Dr. Lorra Hals via telephone at 3:20 p.m. Electronically Signed   By: Feliberto Harts M.D.   On: 11/21/2020 15:32        Scheduled Meds:  aspirin EC  81 mg Oral QPM   atorvastatin  80 mg Oral q1800   carbidopa-levodopa  1.5 tablet Oral TID   [START ON 12/03/2020]  cyanocobalamin  1,000 mcg Intramuscular Q30 days   donepezil  10 mg Oral QHS   enoxaparin (LOVENOX) injection  40 mg Subcutaneous Q24H   furosemide  40 mg Oral BID   insulin aspart  0-5 Units Subcutaneous QHS   insulin aspart  0-9 Units Subcutaneous TID WC   insulin glargine-yfgn  10 Units Subcutaneous Daily   metoprolol tartrate  50 mg Oral BID   mirtazapine  7.5 mg Oral QHS   potassium chloride  40 mEq Oral Daily   senna  1 tablet Oral QPM   sertraline  100 mg Oral Daily   Continuous Infusions:  sodium chloride 50 mL/hr at 11/21/20 1937     LOS: 1 day    Time spent: 35 minutes.     Alba Cory, MD [facility]   If 7PM-7AM, please contact night-coverage [email or web address]  11/22/2020, 8:49 AM

## 2020-11-22 NOTE — Progress Notes (Addendum)
Occupational Therapy Evaluation Patient Details Name: Amanda Fritz MRN: [id number] DOB: 10/27/1955 Today's Date: 11/22/2020    History of Present Illness Pt is a 65 y.o. female admitted 9/10 for seizure-like activity and left-sided weakness. PMH significant of multiple medical issues including asthma, anxiety, type 2 diabetes, fibromyalgia, coronary artery disease, history of CVA, hypertension, neuropathy, history of subdural hematoma, and Parkinson's.   Clinical Impression   Pt presents with above diagnosis. PTA pt PLOF living at a nursing facility (memory care unit). Pt unable to state level of assistance at the facility. Per chart, pt required use of RW for functional mobility. Pt is currently limited with safe ADL engagement due to weakness, processing skills, deficits in perception/vision,  safety awareness, and functional stability/mobility. Pt will benefit to continued skilled level OT in acute setting as well as SNF to maximize independence prior to transition to dc setting.     Follow Up Recommendations  SNF;Supervision/Assistance - 24 hour    Equipment Recommendations   (defer to post acute setting.)    Recommendations for Other Services       Precautions / Restrictions Precautions Precautions: Fall      Mobility Bed Mobility Overal bed mobility: Needs Assistance Bed Mobility: Supine to Sit;Sit to Supine;Rolling Rolling: Supervision (heavy cueing for direction of rolling with visual and verbal stim during pericare due to incontinence.)   Supine to sit: Min assist;HOB elevated Sit to supine: Min assist;HOB elevated   General bed mobility comments: +rail, cues for sequencing for OOB. Improved initiation with return to bed.    Transfers Overall transfer level: Needs assistance Equipment used: Rolling walker (2 wheeled) Transfers: Sit to/from Stand Sit to Stand: Mod assist         General transfer comment: assist to power up and stabilize balance, cues for  sequencing    Balance Overall balance assessment: Needs assistance Sitting-balance support: Feet supported;No upper extremity supported;Bilateral upper extremity supported Sitting balance-Leahy Scale: Fair     Standing balance support: Bilateral upper extremity supported;During functional activity Standing balance-Leahy Scale: Poor Standing balance comment: reliant on external support                           ADL either performed or assessed with clinical judgement   ADL Overall ADL's : Needs assistance/impaired Eating/Feeding: Set up;Bed level Eating/Feeding Details (indicate cue type and reason): Not formally assessed however, will infer set up with supported sitting. Grooming: Moderate assistance Grooming Details (indicate cue type and reason): Pt limited with sequencing of oral care and required support in sitting due to instability.             Lower Body Dressing: Moderate assistance Lower Body Dressing Details (indicate cue type and reason): While seated EOB, pt able to demonstrate doffing of L sock, however, requiring additional assistance to don Toilet Transfer: Moderate assistance Toilet Transfer Details (indicate cue type and reason): pt demonstrated sit to stand with heavy cueing for safe hand placement. pt limited with initial push up to stand with increased hip extension to stand. Pt instructed to lean forward for safe progress to standing. Heavy cueing to side step towards [facility] Toileting- Clothing Manipulation and Hygiene: Total assistance Toileting - Clothing Manipulation Details (indicate cue type and reason): When presented to supine pt observed with soiled linens. Max A to total A for pericare.             Vision   Vision Assessment?: Vision impaired- to be further  tested in functional context     Perception Perception Perception Tested?: Yes Perception Deficits: Inattention/neglect Inattention/Neglect: Does not attend to left visual  field Comments: Pt limited with tracking visual stim towards L side. Finger to nose testing pt observed to over reach for desirec target, likely limitations in depth perception.   Praxis      Pertinent Vitals/Pain Pain Assessment: Faces Faces Pain Scale: No hurt     Hand Dominance     Extremity/Trunk Assessment Upper Extremity Assessment Upper Extremity Assessment: Generalized weakness   Lower Extremity Assessment Lower Extremity Assessment: Defer to PT evaluation   Cervical / Trunk Assessment Cervical / Trunk Assessment: Kyphotic   Communication     Cognition Arousal/Alertness: Lethargic Behavior During Therapy: Flat affect Overall Cognitive Status: Difficult to assess                                 General Comments: Pt very sleepy. Little to no verbalizations. Following simple commands with increased time.   General Comments       Exercises     Shoulder Instructions      Home Living Family/patient expects to be discharged to:: Skilled nursing facility                                        Prior Functioning/Environment Level of Independence: Needs assistance  Gait / Transfers Assistance Needed: Per chart, amb with RW at facility     Comments: Per chart, pt resides in memory care unit. No family present. Pt unable to provide history.        OT Problem List: Decreased strength;Impaired balance (sitting and/or standing);Decreased coordination;Decreased knowledge of use of DME or AE;Decreased knowledge of precautions;Decreased safety awareness;Decreased cognition;Impaired vision/perception      OT Treatment/Interventions: Self-care/ADL training;Therapeutic exercise;Neuromuscular education;DME and/or AE instruction;Therapeutic activities;Cognitive remediation/compensation;Visual/perceptual remediation/compensation;Patient/family education;Balance training    OT Goals(Current goals can be found in the care plan section) Acute Rehab  OT Goals Patient Stated Goal: unable to state OT Goal Formulation: Patient unable to participate in goal setting Time For Goal Achievement: 12/06/20 Potential to Achieve Goals: Fair  OT Frequency: Min 2X/week   Barriers to D/C:            Co-evaluation              AM-PAC OT "6 Clicks" Daily Activity     Outcome Measure Help from another person eating meals?: A Little Help from another person taking care of personal grooming?: A Little Help from another person toileting, which includes using toliet, bedpan, or urinal?: Total Help from another person bathing (including washing, rinsing, drying)?: A Lot Help from another person to put on and taking off regular upper body clothing?: A Little Help from another person to put on and taking off regular lower body clothing?: A Lot 6 Click Score: 14   End of Session Equipment Utilized During Treatment: Rolling walker;Gait belt Nurse Communication: Mobility status  Activity Tolerance: Patient limited by lethargy Patient left: in bed;with call bell/phone within reach;with bed alarm set;with nursing/sitter in room  OT Visit Diagnosis: Unsteadiness on feet (R26.81);Muscle weakness (generalized) (M62.81)                Time: 6834-1962 OT Time Calculation (min): 33 min Charges:  OT General Charges $OT Visit: 1 Visit OT Evaluation $OT Eval Moderate  Complexity: 1 Mod OT Treatments $Self Care/Home Management : 8-22 mins  Marquette Old, MSOT, OTR/L  Supplemental Rehabilitation Services  [phone] '  Zigmund Daniel 11/22/2020, 2:41 PM

## 2020-11-22 NOTE — Evaluation (Signed)
Physical Therapy Evaluation Patient Details Name: Amanda Fritz MRN: [id number] DOB: [birth year] Today's Date: 11/22/2020   History of Present Illness  Pt is a 65 y.o. female admitted 9/10 for seizure-like activity and left-sided weakness. PMH significant of multiple medical issues including asthma, anxiety, type 2 diabetes, fibromyalgia, coronary artery disease, history of CVA, hypertension, neuropathy, history of subdural hematoma, and Parkinson's.   Clinical Impression  Pt admitted with above diagnosis. PTA pt resided in memory care unit of SNF, ambulating with RW. Unsure of assist level needed. On eval, pt required min assist bed mobility, and mod assist sit to stand. Pt only stood for a few seconds before returning to sit EOB. Pt very lethargic. Minimal verbalizations but following simple commands. Pt immediately asleep upon return to supine in bed. Pt currently with functional limitations due to the deficits listed below (see PT Problem List). Pt will benefit from skilled PT to increase their independence and safety with mobility to allow discharge to the venue listed below.       Follow Up Recommendations SNF    Equipment Recommendations  None recommended by PT    Recommendations for Other Services       Precautions / Restrictions Precautions Precautions: Fall      Mobility  Bed Mobility Overal bed mobility: Needs Assistance Bed Mobility: Supine to Sit;Sit to Supine     Supine to sit: Min assist;HOB elevated Sit to supine: Min assist;HOB elevated   General bed mobility comments: +rail, cues for sequencing for OOB. Improved initiation with return to bed.    Transfers Overall transfer level: Needs assistance Equipment used: Rolling walker (2 wheeled) Transfers: Sit to/from Stand Sit to Stand: Mod assist         General transfer comment: assist to power up and stabilize balance, cues for sequencing  Ambulation/Gait             General Gait Details: unable  due to lethargy  Stairs            Wheelchair Mobility    Modified Rankin (Stroke Patients Only) Modified Rankin (Stroke Patients Only) Pre-Morbid Rankin Score: Moderately severe disability Modified Rankin: Moderately severe disability     Balance Overall balance assessment: Needs assistance Sitting-balance support: Feet supported;No upper extremity supported;Bilateral upper extremity supported Sitting balance-Leahy Scale: Fair     Standing balance support: Bilateral upper extremity supported;During functional activity Standing balance-Leahy Scale: Poor Standing balance comment: reliant on external support                             Pertinent Vitals/Pain Pain Assessment: Faces Faces Pain Scale: No hurt    Home Living Family/patient expects to be discharged to:: Skilled nursing facility                      Prior Function Level of Independence: Needs assistance   Gait / Transfers Assistance Needed: Per chart, amb with RW at facility     Comments: Per chart, pt resides in memory care unit. No family present. Pt unable to provide history.     Hand Dominance        Extremity/Trunk Assessment   Upper Extremity Assessment Upper Extremity Assessment: Defer to OT evaluation    Lower Extremity Assessment Lower Extremity Assessment: Generalized weakness (unable to follow commands for MMT)    Cervical / Trunk Assessment Cervical / Trunk Assessment: Kyphotic  Communication      Cognition Arousal/Alertness:  Lethargic Behavior During Therapy: Flat affect Overall Cognitive Status: Difficult to assess                                 General Comments: Pt very sleepy. Little to no verbalizations. Following simple commands with increased time.      General Comments      Exercises     Assessment/Plan    PT Assessment Patient needs continued PT services  PT Problem List Decreased strength;Decreased mobility;Decreased  balance;Decreased activity tolerance;Decreased cognition       PT Treatment Interventions Therapeutic activities;Gait training;Therapeutic exercise;Patient/family education;Balance training;Functional mobility training    PT Goals (Current goals can be found in the Care Plan section)  Acute Rehab PT Goals Patient Stated Goal: unable to state PT Goal Formulation: Patient unable to participate in goal setting Time For Goal Achievement: 12/06/20 Potential to Achieve Goals: Fair    Frequency Min 3X/week   Barriers to discharge        Co-evaluation               AM-PAC PT "6 Clicks" Mobility  Outcome Measure Help needed turning from your back to your side while in a flat bed without using bedrails?: A Little Help needed moving from lying on your back to sitting on the side of a flat bed without using bedrails?: A Little Help needed moving to and from a bed to a chair (including a wheelchair)?: A Lot Help needed standing up from a chair using your arms (e.g., wheelchair or bedside chair)?: A Lot Help needed to walk in hospital room?: A Lot Help needed climbing 3-5 steps with a railing? : Total 6 Click Score: 13    End of Session Equipment Utilized During Treatment: Gait belt Activity Tolerance: Patient limited by lethargy Patient left: in bed;with call bell/phone within reach;with bed alarm set Nurse Communication: Mobility status PT Visit Diagnosis: Other abnormalities of gait and mobility (R26.89);Muscle weakness (generalized) (M62.81)    Time: 2707-8675 PT Time Calculation (min) (ACUTE ONLY): 14 min   Charges:   PT Evaluation $PT Eval Moderate Complexity: 1 Mod          Aida Raider, PT  Office # [phone] Pager [phone]   Ilda Foil 11/22/2020, 10:56 AM

## 2020-11-22 NOTE — Plan of Care (Signed)
  Problem: Clinical Measurements: Goal: Will remain free from infection Outcome: Progressing Goal: Respiratory complications will improve Outcome: Progressing Goal: Cardiovascular complication will be avoided Outcome: Progressing   Problem: Nutrition: Goal: Adequate nutrition will be maintained Outcome: Progressing   Problem: Coping: Goal: Level of anxiety will decrease Outcome: Progressing   Problem: Elimination: Goal: Will not experience complications related to bowel motility Outcome: Progressing Goal: Will not experience complications related to urinary retention Outcome: Progressing   Problem: Pain Managment: Goal: General experience of comfort will improve Outcome: Progressing   Problem: Safety: Goal: Ability to remain free from injury will improve Outcome: Progressing   Problem: Skin Integrity: Goal: Risk for impaired skin integrity will decrease Outcome: Progressing   Problem: Self-Care: Goal: Ability to participate in self-care as condition permits will improve Outcome: Progressing Goal: Ability to communicate needs accurately will improve Outcome: Progressing   Problem: Nutrition: Goal: Risk of aspiration will decrease Outcome: Progressing

## 2020-11-22 NOTE — Progress Notes (Signed)
SLP Cancellation Note  Patient Details Name: Amanda Fritz MRN: [id number] DOB: 12/15/1955   Cancelled treatment:       Reason Eval/Treat Not Completed: Medical issues which prohibited therapy  RN asking ST to hold due to change in status.  RN to inform ST today if evaluation desired.  If not ST will follow up next date to assess for readiness for evaluations.  Dimas Aguas, MA, CCC-SLP Acute Rehab SLP [phone]  [name] 11/22/2020, 8:39 AM

## 2020-11-22 NOTE — Procedures (Signed)
Patient Name: Amanda Fritz  MRN: [id number]  Epilepsy Attending: Charlsie Quest  Referring Physician/Provider: Dr Doree Albee Date: 11/22/2020 Duration: 22.35 mins  Patient history: 64yo f with left-sided weakness/seizure-like activity. EEG to evaluate for seizure  Level of alertness: Awake  AEDs during EEG study: None  Technical aspects: This EEG study was done with scalp electrodes positioned according to the 10-20 International system of electrode placement. Electrical activity was acquired at a sampling rate of 500Hz  and reviewed with a high frequency filter of 70Hz  and a low frequency filter of 1Hz . EEG data were recorded continuously and digitally stored.   Description: The posterior dominant rhythm consists of 8-9 Hz activity of moderate voltage (25-35 uV) seen predominantly in posterior head regions, symmetric and reactive to eye opening and eye closing. Physiologic photic driving was not seen during photic stimulation.  Hyperventilation was not performed.     Of note, this study was technically difficult due to significant movement and myogenic artifact.   IMPRESSION: This technically difficult study is within normal limits. No seizures or epileptiform discharges were seen throughout the recording.  If suspicion for interictal activity remains a concern, a repeat study can be considered.   Amanda Fritz 

## 2020-11-22 NOTE — Progress Notes (Signed)
AM assessment different from report/previous documented assessments, unable to complete [facility] pt following commands but won't open eyes for gaze and visual assessment, attending MD notified and coming to bedside, Stroke MD paged. CBG 71, verbal orders for 41ml D50 received

## 2020-11-22 NOTE — Progress Notes (Signed)
  Echocardiogram 2D Echocardiogram has been performed.  Amanda Fritz 11/22/2020, 9:35 AM

## 2020-11-23 ENCOUNTER — Inpatient Hospital Stay (HOSPITAL_COMMUNITY): Payer: Medicare Other

## 2020-11-23 DIAGNOSIS — R569 Unspecified convulsions: Secondary | ICD-10-CM | POA: Diagnosis not present

## 2020-11-23 LAB — GLUCOSE, CAPILLARY
Glucose-Capillary: 131 mg/dL — ABNORMAL HIGH (ref 70–99)
Glucose-Capillary: 73 mg/dL (ref 70–99)
Glucose-Capillary: 119 mg/dL — ABNORMAL HIGH (ref 70–99)
Glucose-Capillary: 150 mg/dL — ABNORMAL HIGH (ref 70–99)

## 2020-11-23 IMAGING — MR MR HEAD W/ CM
4 of 9 series · 14 of 48 positions shown · IV contrast ([ID] GADAVIST)
Comparison: Previous noncontrast MRIs from [DATE].

CLINICAL DATA: Initial evaluation for seizure, abnormal neuro exam.

EXAM:
MRI HEAD WITH CONTRAST
TECHNIQUE: Multiplanar, multiecho pulse sequences of the brain and surrounding
structures were obtained with intravenous contrast.
CONTRAST:  7.5mL GADAVIST GADOBUTROL 1 MMOL/ML IV SOLN

[Series 5: T1 post-contrast · sagittal · 5.0mm · 0.72mm/px · 3 of 24 slices shown (1 of 2)]
[im 1/24]
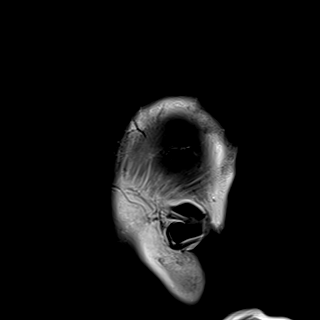
[im 12/24]
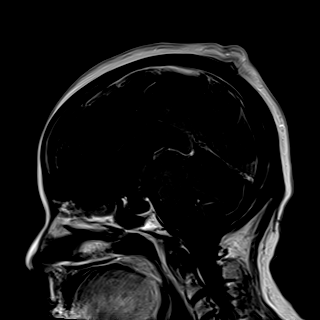
[im 24/24]
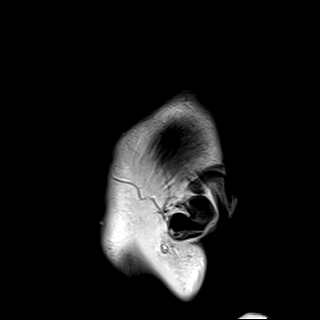

[Series 6: T2 post-contrast · coronal · 5.0mm · 0.72mm/px · 4 of 28 slices shown]
[im 1/28]
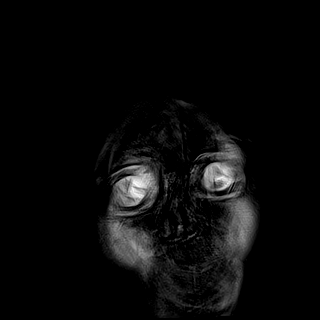
[im 10/28]
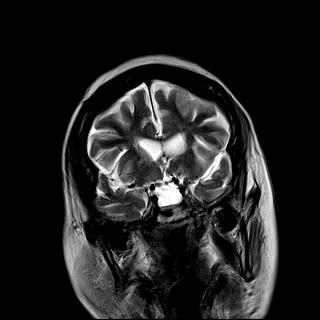
[im 19/28]
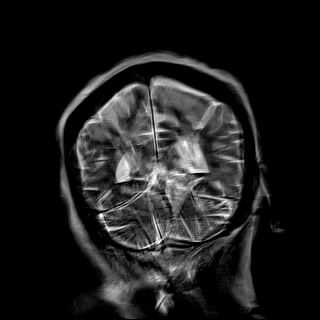
[im 28/28]
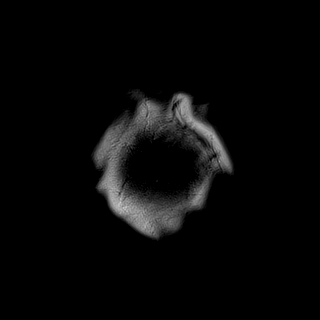

[Series 7: FLAIR post-contrast · axial · 5.0mm · 0.90mm/px · z∈[-165,-26]mm · 3 of 25 slices shown]
[im 1/25]
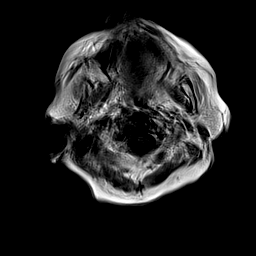
[im 13/25]
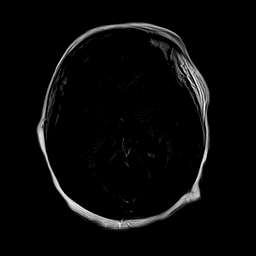
[im 25/25]
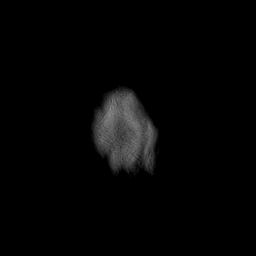

[Series 9: T1 post-contrast · coronal · 5.0mm · 0.34mm/px · 4 of 28 slices shown (2 of 2)]
[im 1/28]
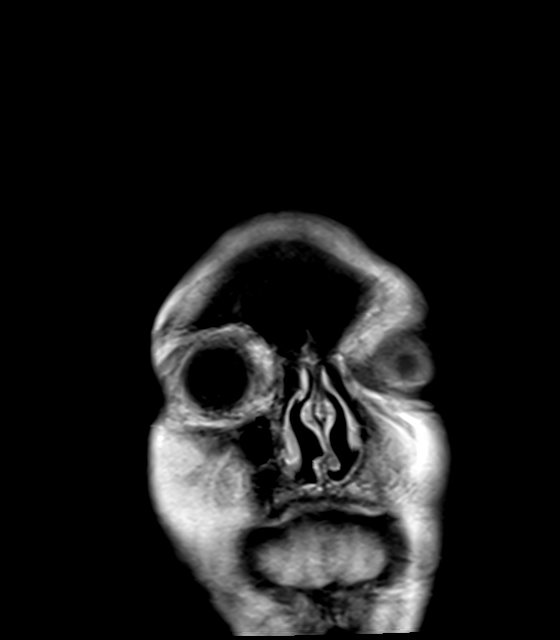
[im 10/28]
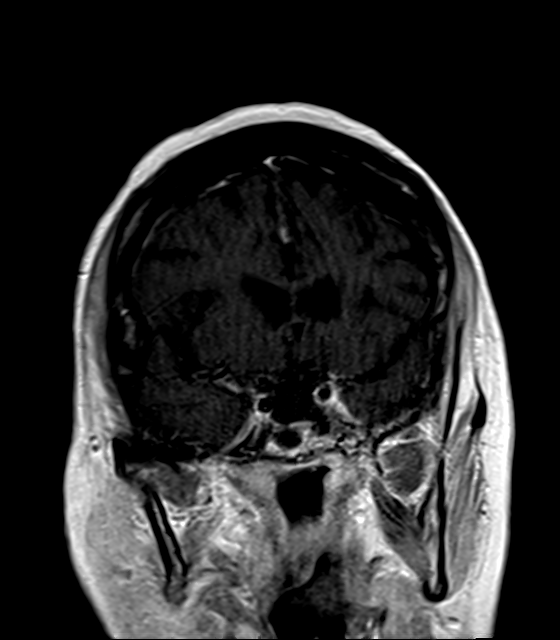
[im 19/28]
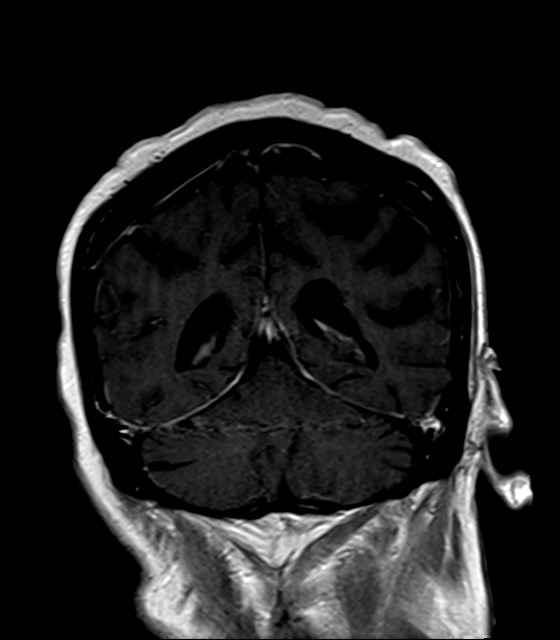
[im 28/28]
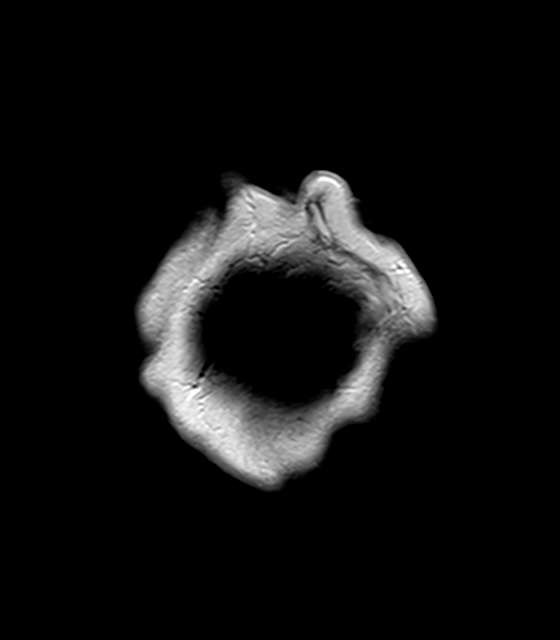

[14 of 48 positions shown; findings below may reference images not displayed]

FINDINGS: Examination is degraded by motion artifact. No abnormal or
pathologic enhancement seen within the brain following contrast
administration.

The remainder of the examination is otherwise unchanged.
IMPRESSION: 1. Somewhat limited exam due to motion artifact.
2. No abnormal or pathologic enhancement within the brain following
contrast administration.

## 2020-11-23 MED ORDER — GADOBUTROL 1 MMOL/ML IV SOLN
7.5000 mL | Freq: Once | INTRAVENOUS | Status: AC | PRN
  Administered 2020-11-23: 7.5 mL via INTRAVENOUS

## 2020-11-23 NOTE — Progress Notes (Signed)
PROGRESS NOTE    Amanda Fritz  GEZ:[id number] DOB: [birth year] DOA: 11/21/2020 PCP: Galvin Proffer, MD   Brief Narrative: 65 year old with past medical history significant for asthma, anxiety, type 2 diabetes, fibromyalgia, CAD, history of CVA, hypertension, neuropathy, history of subdural hematoma, Parkinson's presented with seizure-like activity and left-sided weakness.  Neurology was consulted recommended work-up for TIA or stroke and to rule out seizure.    Assessment & Plan:   Active Problems:   Left-sided weakness   Focal neurological deficit  1-Left-sided weakness/seizure-like activity: -She is usually more sleepy during morning.  -MRI; no evidence of acute infarct, moderate chronic microvascular ischemic changes with small remote infarct in the right basal ganglia, left frontal white matter and left cerebellum.  Prior microhemorrhage in the right thalamus and left cerebellum. -MRA head: Moderate to severely motion degraded study.  No evidence of large vessel occlusion -MRA neck: Probably mild to moderate stenosis of the mid right cervical ICA. -LDL 27 and A1c 6.5. -B 12 650 -EEG.negative for seizure.  -ECHO; normal EF, grade 1 diastolic dysfunction.   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   3-Dementia: -Holding Aricept per neurology recommendation.   4-Type 2 Diabetes: Hypoglycemia; started D 5 IV fluids.  Hold insulin. Monitor CBG. DC IV fluids.   5-HLD;  Continue with statins.  6-Hypertension: Continue with Metoprolol.  7-CAD: Continue with aspirin, as needed nitroglycerin. Per admitting MD patient complaining of chest pain.  Troponin negative 8-Epigastric pain: Started  PPI.  9-History of B 12 deficiency;  Continue with B 12 supplement.  Repeat B 12 level. 554.  Estimated body mass index is 27.1 kg/m as calculated from the following:   Height as of this encounter: 5\' 3"  (1.6 m).   Weight as of this  encounter: 69.4 kg.   DVT prophylaxis: Lovenox Code Status: Full Code Family Communication: Care discussed with daughter who was at bedside,  Disposition Plan:  Status is: Inpatient  Remains inpatient appropriate because:IV treatments appropriate due to intensity of illness or inability to take PO  Dispo: The patient is from: SNF              Anticipated d/c is to: SNF              Patient currently is not medically stable to d/c.   Difficult to place patient No        Consultants:  Neurology   Procedures:  EEG ECHO   Antimicrobials:    Subjective: She is alert this afternoon, answer questions.  Daughter think patient is more slow and delay in her speech than even on Saturday.    Objective: Vitals:   11/23/20 0030 11/23/20 0441 11/23/20 0813 11/23/20 1243  BP: 122/60 (!) 148/80 120/71 111/66  Pulse: 60 60 (!) 57 62  Resp: 18 18 16 16   Temp: 98.2 F (36.8 C) 97.6 F (36.4 C) 97.6 F (36.4 C) 98.1 F (36.7 C)  TempSrc: Oral Oral Oral Oral  SpO2: 100% 98% 100% 100%  Weight:      Height:        Intake/Output Summary (Last 24 hours) at 11/23/2020 1356 Last data filed at 11/23/2020 0500 Gross per 24 hour  Intake 814.1 ml  Output 225 ml  Net 589.1 ml    Filed Weights   11/21/20 1410  Weight: 69.4 kg    Examination:  General exam: Alert Respiratory system: CTA Cardiovascular system: S 1, S 2 RRR Gastrointestinal system: BS , present,soft, nt  Central nervous system: Alert, follows command,  Extremities: No edema Data Reviewed: I have personally reviewed following labs and imaging studies  CBC: Recent Labs  Lab 11/21/20 1449 11/21/20 1505 11/22/20 0255 11/22/20 1220  WBC 7.0  --  7.3 5.5  NEUTROABS 5.3  --   --   --   HGB 10.3* 11.2* 10.2* 9.8*  HCT 32.6* 33.0* 31.6* 31.3*  MCV 78.7*  --  76.5* 79.4*  PLT 300  --  286 257    Basic Metabolic Panel: Recent Labs  Lab 11/21/20 1449 11/21/20 1505 11/22/20 0255 11/22/20 1220  NA 135 138  139 139  K 3.6 3.8 4.2 3.7  CL 99 100 105 107  CO2 25  --  26 24  GLUCOSE 158* 161* 98 68*  BUN 9 7* 5* 5*  CREATININE 0.63 0.50 0.60 0.56  CALCIUM 8.7*  --  9.3 9.0    GFR: Estimated Creatinine Clearance: 64.6 mL/min (by C-G formula based on SCr of 0.56 mg/dL). Liver Function Tests: Recent Labs  Lab 11/21/20 1449 11/22/20 0255  AST 25 23  ALT 6 15  ALKPHOS 114 97  BILITOT 0.3 0.5  PROT 7.6 7.2  ALBUMIN 3.5 3.2*    No results for input(s): LIPASE, AMYLASE in the last 168 hours. No results for input(s): AMMONIA in the last 168 hours. Coagulation Profile: Recent Labs  Lab 11/21/20 1449  INR 1.0    Cardiac Enzymes: No results for input(s): CKTOTAL, CKMB, CKMBINDEX, TROPONINI in the last 168 hours. BNP (last 3 results) No results for input(s): PROBNP in the last 8760 hours. HbA1C: Recent Labs    11/22/20 0255 11/22/20 1415  HGBA1C 6.5* 6.5*   CBG: Recent Labs  Lab 11/22/20 1220 11/22/20 1528 11/22/20 2223 11/23/20 0608 11/23/20 1153  GLUCAP 75 73 181* 131* 73    Lipid Profile: Recent Labs    11/22/20 0255 11/22/20 1415  CHOL 81 81  HDL 38* 38*  LDLCALC 32 27  TRIG 55 81  CHOLHDL 2.1 2.1   Thyroid Function Tests: No results for input(s): TSH, T4TOTAL, FREET4, T3FREE, THYROIDAB in the last 72 hours. Anemia Panel: Recent Labs    11/22/20 1220  VITAMINB12 554   Sepsis Labs: No results for input(s): PROCALCITON, LATICACIDVEN in the last 168 hours.  Recent Results (from the past 240 hour(s))  Resp Panel by RT-PCR (Flu A&B, Covid) Nasopharyngeal Swab     Status: None   Collection Time: 11/21/20  5:02 PM   Specimen: Nasopharyngeal Swab; Nasopharyngeal(NP) swabs in vial transport medium  Result Value Ref Range Status   SARS Coronavirus 2 by RT PCR NEGATIVE NEGATIVE Final    Comment: (NOTE) SARS-CoV-2 target nucleic acids are NOT DETECTED.  The SARS-CoV-2 RNA is generally detectable in upper respiratory specimens during the acute phase of  infection. The lowest concentration of SARS-CoV-2 viral copies this assay can detect is 138 copies/mL. A negative result does not preclude SARS-Cov-2 infection and should not be used as the sole basis for treatment or other patient management decisions. A negative result may occur with  improper specimen collection/handling, submission of specimen other than nasopharyngeal swab, presence of viral mutation(s) within the areas targeted by this assay, and inadequate number of viral copies(<138 copies/mL). A negative result must be combined with clinical observations, patient history, and epidemiological information. The expected result is Negative.  Fact Sheet for Patients:  BloggerCourse.comhttps://www.fda.gov/media/152166/download  Fact Sheet for Healthcare Providers:  SeriousBroker.ithttps://www.fda.gov/media/152162/download  This test is no t yet approved or cleared by  the [employer] and  has been authorized for detection and/or diagnosis of SARS-CoV-2 by FDA under an Emergency Use Authorization (EUA). This EUA will remain  in effect (meaning this test can be used) for the duration of the COVID-19 declaration under Section 564(b)(1) of the Act, 21 U.S.C.section 360bbb-3(b)(1), unless the authorization is terminated  or revoked sooner.       Influenza A by PCR NEGATIVE NEGATIVE Final   Influenza B by PCR NEGATIVE NEGATIVE Final    Comment: (NOTE) The Xpert Xpress SARS-CoV-2/FLU/RSV plus assay is intended as an aid in the diagnosis of influenza from Nasopharyngeal swab specimens and should not be used as a sole basis for treatment. Nasal washings and aspirates are unacceptable for Xpert Xpress SARS-CoV-2/FLU/RSV testing.  Fact Sheet for Patients: BloggerCourse.com  Fact Sheet for Healthcare Providers: SeriousBroker.it  This test is not yet approved or cleared by the [place] FDA and has been authorized for detection and/or diagnosis of SARS-CoV-2  by FDA under an Emergency Use Authorization (EUA). This EUA will remain in effect (meaning this test can be used) for the duration of the COVID-19 declaration under Section 564(b)(1) of the Act, 21 U.S.C. section 360bbb-3(b)(1), unless the authorization is terminated or revoked.  Performed at [facility], [address], [place], [place] [address]           Radiology Studies: MR ANGIO HEAD WO CONTRAST  Result Date: 11/22/2020 CLINICAL DATA:  cva; Stroke, follow up EXAM: MRI HEAD WITHOUT CONTRAST MRA HEAD WITHOUT CONTRAST MRA NECK WITHOUT AND WITH CONTRAST TECHNIQUE: Multiplanar, multi-echo pulse sequences of the brain and surrounding structures were acquired without intravenous contrast. Angiographic images of the Circle of Willis were acquired using MRA technique without intravenous contrast. Angiographic images of the neck were acquired using MRA technique without and with intravenous contrast. Carotid stenosis measurements (when applicable) are obtained utilizing NASCET criteria, using the distal internal carotid diameter as the denominator. CONTRAST:  6.34mL GADAVIST GADOBUTROL 1 MMOL/ML IV SOLN COMPARISON:  CT 11/21/2020. FINDINGS: MRI HEAD FINDINGS Motion limited study.  Within this limitation: Brain: No acute infarction, hemorrhage, hydrocephalus, extra-axial collection or mass lesion. No pathologic intracranial enhancement. Moderate scattered T2 hyperintensities within the supraventricular and pontine white matter, nonspecific but most likely secondary to chronic microvascular ischemic disease given the patient's known risk factors (including diabetes and hypertension). 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 Small focus of susceptibility artifact within the left cerebellum and right thalamus, likely the sequela of prior microhemorrhage. Vascular: See below. Skull and upper cervical spine: Normal marrow signal. Sinuses/Orbits: Clear  sinuses.  Unremarkable orbits. Other: Small bilateral mastoid effusions. MRA HEAD FINDINGS Moderate to severely motion degraded study. This precludes adequate evaluation for, and accurate quantification of, intracranial arterial stenoses. This also precludes adequateevaluation for intracranial aneurysms. Anterior circulation: Bilateral intracranial ICAs are patent. Bilateral M1 MCAs are patent. Limited evaluation of the more distal MCA branches with some vascular flow related signal bilaterally. Bilateral A1 ACAs appear to be grossly patent. A2 ACA evaluation is essentially nondiagnostic due to extensive motion. Posterior circulation: Bilateral intradural vertebral arteries, basilar artery and proximal posterior cerebral arteries are patent. MRA NECK FINDINGS Motion limited study. Aortic arch: Great vessel origins are patent. Right carotid system: Patent. Probable mild-to-moderate stenosis of the mid right ICA. Left carotid system: Patent.  No evidence of high-grade stenosis. Vertebral arteries: Patent.  No evidence of high-grade stenosis. IMPRESSION: MRI: 1. No evidence of acute intracranial abnormality on  this motion limited exam. Specifically, no acute infarct. 2. 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 3. Prior microhemorrhages in the right thalamus and left cerebellum, potentially hypertensive given location and the patient's known history of hypertension. MRA Head: 1. No evidence of large vessel occlusion. 2. Moderate to severely motion degraded study. This precludes adequate evaluation for, and accurate quantification of, intracranial arterial stenoses. This also precludes adequateevaluation for intracranial aneurysms. A CTA may be able to better characterize if clinically indicated. MRA Neck: 1. Motion limited study with patent major arteries in the neck 2. Probable mild-to-moderate stenosis of the  mid right cervical ICA. Electronically Signed   By: Feliberto Harts M.D.   On: 11/22/2020 12:34   MR ANGIO NECK W WO CONTRAST  Result Date: 11/22/2020 CLINICAL DATA:  cva; Stroke, follow up EXAM: MRI HEAD WITHOUT CONTRAST MRA HEAD WITHOUT CONTRAST MRA NECK WITHOUT AND WITH CONTRAST TECHNIQUE: Multiplanar, multi-echo pulse sequences of the brain and surrounding structures were acquired without intravenous contrast. Angiographic images of the Circle of Willis were acquired using MRA technique without intravenous contrast. Angiographic images of the neck were acquired using MRA technique without and with intravenous contrast. Carotid stenosis measurements (when applicable) are obtained utilizing NASCET criteria, using the distal internal carotid diameter as the denominator. CONTRAST:  6.23mL GADAVIST GADOBUTROL 1 MMOL/ML IV SOLN COMPARISON:  CT 11/21/2020. FINDINGS: MRI HEAD FINDINGS Motion limited study.  Within this limitation: Brain: No acute infarction, hemorrhage, hydrocephalus, extra-axial collection or mass lesion. No pathologic intracranial enhancement. Moderate scattered T2 hyperintensities within the supraventricular and pontine white matter, nonspecific but most likely secondary to chronic microvascular ischemic disease given the patient's known risk factors (including diabetes and hypertension). 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 Small focus of susceptibility artifact within the left cerebellum and right thalamus, likely the sequela of prior microhemorrhage. Vascular: See below. Skull and upper cervical spine: Normal marrow signal. Sinuses/Orbits: Clear sinuses.  Unremarkable orbits. Other: Small bilateral mastoid effusions. MRA HEAD FINDINGS Moderate to severely motion degraded study. This precludes adequate evaluation for, and accurate quantification of, intracranial arterial stenoses. This also precludes adequateevaluation for  intracranial aneurysms. Anterior circulation: Bilateral intracranial ICAs are patent. Bilateral M1 MCAs are patent. Limited evaluation of the more distal MCA branches with some vascular flow related signal bilaterally. Bilateral A1 ACAs appear to be grossly patent. A2 ACA evaluation is essentially nondiagnostic due to extensive motion. Posterior circulation: Bilateral intradural vertebral arteries, basilar artery and proximal posterior cerebral arteries are patent. MRA NECK FINDINGS Motion limited study. Aortic arch: Great vessel origins are patent. Right carotid system: Patent. Probable mild-to-moderate stenosis of the mid right ICA. Left carotid system: Patent.  No evidence of high-grade stenosis. Vertebral arteries: Patent.  No evidence of high-grade stenosis. IMPRESSION: MRI: 1. No evidence of acute intracranial abnormality on this motion limited exam. Specifically, no acute infarct. 2. 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 3. Prior microhemorrhages in the right thalamus and left cerebellum, potentially hypertensive given location and the patient's known history of hypertension. MRA Head: 1. No evidence of large vessel occlusion. 2. Moderate to severely motion degraded study. This precludes adequate evaluation for, and accurate quantification of, intracranial arterial stenoses. This also precludes adequateevaluation for intracranial aneurysms. A CTA may be able to better characterize if clinically indicated. MRA  Neck: 1. Motion limited study with patent major arteries in the neck 2. Probable mild-to-moderate stenosis of the mid right cervical ICA. Electronically Signed   By: Feliberto Harts M.D.   On: 11/22/2020 12:34   MR BRAIN WO CONTRAST  Result Date: 11/22/2020 CLINICAL DATA:  cva; Stroke, follow up EXAM: MRI HEAD WITHOUT CONTRAST MRA HEAD WITHOUT CONTRAST MRA NECK WITHOUT AND WITH CONTRAST  TECHNIQUE: Multiplanar, multi-echo pulse sequences of the brain and surrounding structures were acquired without intravenous contrast. Angiographic images of the Circle of Willis were acquired using MRA technique without intravenous contrast. Angiographic images of the neck were acquired using MRA technique without and with intravenous contrast. Carotid stenosis measurements (when applicable) are obtained utilizing NASCET criteria, using the distal internal carotid diameter as the denominator. CONTRAST:  6.76mL GADAVIST GADOBUTROL 1 MMOL/ML IV SOLN COMPARISON:  CT 11/21/2020. FINDINGS: MRI HEAD FINDINGS Motion limited study.  Within this limitation: Brain: No acute infarction, hemorrhage, hydrocephalus, extra-axial collection or mass lesion. No pathologic intracranial enhancement. Moderate scattered T2 hyperintensities within the supraventricular and pontine white matter, nonspecific but most likely secondary to chronic microvascular ischemic disease given the patient's known risk factors (including diabetes and hypertension). 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 Small focus of susceptibility artifact within the left cerebellum and right thalamus, likely the sequela of prior microhemorrhage. Vascular: See below. Skull and upper cervical spine: Normal marrow signal. Sinuses/Orbits: Clear sinuses.  Unremarkable orbits. Other: Small bilateral mastoid effusions. MRA HEAD FINDINGS Moderate to severely motion degraded study. This precludes adequate evaluation for, and accurate quantification of, intracranial arterial stenoses. This also precludes adequateevaluation for intracranial aneurysms. Anterior circulation: Bilateral intracranial ICAs are patent. Bilateral M1 MCAs are patent. Limited evaluation of the more distal MCA branches with some vascular flow related signal bilaterally. Bilateral A1 ACAs appear to be grossly patent. A2 ACA evaluation is essentially  nondiagnostic due to extensive motion. Posterior circulation: Bilateral intradural vertebral arteries, basilar artery and proximal posterior cerebral arteries are patent. MRA NECK FINDINGS Motion limited study. Aortic arch: Great vessel origins are patent. Right carotid system: Patent. Probable mild-to-moderate stenosis of the mid right ICA. Left carotid system: Patent.  No evidence of high-grade stenosis. Vertebral arteries: Patent.  No evidence of high-grade stenosis. IMPRESSION: MRI: 1. No evidence of acute intracranial abnormality on this motion limited exam. Specifically, no acute infarct. 2. 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 3. Prior microhemorrhages in the right thalamus and left cerebellum, potentially hypertensive given location and the patient's known history of hypertension. MRA Head: 1. No evidence of large vessel occlusion. 2. Moderate to severely motion degraded study. This precludes adequate evaluation for, and accurate quantification of, intracranial arterial stenoses. This also precludes adequateevaluation for intracranial aneurysms. A CTA may be able to better characterize if clinically indicated. MRA Neck: 1. Motion limited study with patent major arteries in the neck 2. Probable mild-to-moderate stenosis of the mid right cervical ICA. Electronically Signed   By: Feliberto Harts M.D.   On: 11/22/2020 12:34   MR BRAIN W CONTRAST  Result Date: 11/23/2020 CLINICAL DATA:  Initial evaluation for seizure, abnormal neuro exam. EXAM: MRI HEAD WITH CONTRAST TECHNIQUE: Multiplanar, multiecho pulse sequences of the brain and surrounding structures were obtained with intravenous contrast. CONTRAST:  7.78mL GADAVIST GADOBUTROL 1 MMOL/ML IV SOLN COMPARISON:  Previous noncontrast MRIs from 11/22/2020. FINDINGS: Examination is degraded by motion artifact.  No abnormal or pathologic enhancement  seen within the brain following contrast administration. The remainder of the examination is otherwise unchanged. IMPRESSION: 1. Somewhat limited exam due to motion artifact. 2. No abnormal or pathologic enhancement within the brain following contrast administration. Electronically Signed   By: Rise Mu M.D.   On: 11/23/2020 03:26   DG Pelvis Portable  Result Date: 11/22/2020 CLINICAL DATA:  Pre MRI screening. EXAM: PORTABLE PELVIS 1-2 VIEWS COMPARISON:  09/26/2020 FINDINGS: Central pelvic calcifications compatible with small degenerated fibroids. Lower lumbar spine degenerative changes. Atheromatous arterial calcifications. No metallic foreign bodies seen. IMPRESSION: No acute abnormality.  No metallic foreign bodies. Electronically Signed   By: Beckie Salts M.D.   On: 11/22/2020 09:41   DG CHEST PORT 1 VIEW  Result Date: 11/22/2020 CLINICAL DATA:  Pre MRI screening procedure. EXAM: PORTABLE CHEST 1 VIEW COMPARISON:  09/26/2020 FINDINGS: Poor inspiration. Normal sized heart. Clear lungs. Thoracic spine degenerative changes. No metallic foreign bodies. IMPRESSION: No acute abnormality.  No metallic foreign bodies. Electronically Signed   By: Beckie Salts M.D.   On: 11/22/2020 09:39   DG Abd Portable 1V  Result Date: 11/22/2020 CLINICAL DATA:  Pre MRI screening procedure. EXAM: PORTABLE ABDOMEN - 1 VIEW COMPARISON:  Portable pelvis dated 09/26/2020 FINDINGS: Normal bowel gas pattern. Central pelvic calcifications compatible with a small degenerated uterine fibroids. Lumbar and lower thoracic spine degenerative changes. No metallic foreign bodies are seen. IMPRESSION: No acute abnormality.  No metallic foreign bodies. Electronically Signed   By: Beckie Salts M.D.   On: 11/22/2020 09:36   EEG adult  Result Date: 11/22/2020 Charlsie Quest, MD     11/22/2020  5:13 PM Patient Name: EMMA-LEE ODDO MRN: [id number] Epilepsy Attending: Charlsie Quest Referring Physician/Provider: Dr Doree Albee  Date: 11/22/2020 Duration: 22.35 mins Patient history: 64yo f with left-sided weakness/seizure-like activity. EEG to evaluate for seizure Level of alertness: Awake AEDs during EEG study: None Technical aspects: This EEG study was done with scalp electrodes positioned according to the 10-20 International system of electrode placement. Electrical activity was acquired at a sampling rate of  and reviewed with a high frequency filter of  and a low frequency filter of . EEG data were recorded continuously and digitally stored. Description: The posterior dominant rhythm consists of 8-9 Hz activity of moderate voltage (25-35 uV) seen predominantly in posterior head regions, symmetric and reactive to eye opening and eye closing. Physiologic photic driving was not seen during photic stimulation.  Hyperventilation was not performed.   Of note, this study was technically difficult due to significant movement and myogenic artifact. IMPRESSION: This technically difficult study is within normal limits. No seizures or epileptiform discharges were seen throughout the recording. If suspicion for interictal activity remains a concern, a repeat study can be considered. Charlsie Quest   ECHOCARDIOGRAM COMPLETE  Result Date: 11/22/2020    ECHOCARDIOGRAM REPORT   Patient Name:   XOLANI DEGRACIA Date of Exam: 11/22/2020 Medical Rec #:  [id number]    Height:       63.0 in Accession #:    [id number]   Weight:       153.0 lb Date of Birth:  November 11, 1955   BSA:          1.726 m Patient Age:    64 years     BP:           140/72 mmHg Patient Gender: F  HR:           62 bpm. Exam Location:  Inpatient Procedure: 2D Echo, Cardiac Doppler and Color Doppler Indications:    TIA G45.9  History:        Patient has no prior history of Echocardiogram examinations.                 Stroke; Risk Factors:Diabetes, Hypertension and ETOH.  Sonographer:    Eulah Pont RDCS Referring Phys: [phone] STEVEN J NEWTON IMPRESSIONS  1. Left  ventricular ejection fraction, by estimation, is 60 to 65%. The left ventricle has normal function. The left ventricle has no regional wall motion abnormalities. Left ventricular diastolic parameters are consistent with Grade I diastolic dysfunction (impaired relaxation).  2. Right ventricular systolic function is normal. The right ventricular size is normal. There is normal pulmonary artery systolic pressure.  3. The mitral valve is normal in structure. Trivial mitral valve regurgitation. No evidence of mitral stenosis.  4. The aortic valve is tricuspid. Aortic valve regurgitation is trivial. Mild aortic valve sclerosis is present, with no evidence of aortic valve stenosis.  5. The inferior vena cava is normal in size with greater than 50% respiratory variability, suggesting right atrial pressure of 3 mmHg. FINDINGS  Left Ventricle: Left ventricular ejection fraction, by estimation, is 60 to 65%. The left ventricle has normal function. The left ventricle has no regional wall motion abnormalities. The left ventricular internal cavity size was normal in size. There is  no left ventricular hypertrophy. Left ventricular diastolic parameters are consistent with Grade I diastolic dysfunction (impaired relaxation). Right Ventricle: The right ventricular size is normal. Right ventricular systolic function is normal. There is normal pulmonary artery systolic pressure. The tricuspid regurgitant velocity is 2.23 m/s, and with an assumed right atrial pressure of 3 mmHg,  the estimated right ventricular systolic pressure is 22.9 mmHg. Left Atrium: Left atrial size was normal in size. Right Atrium: Right atrial size was normal in size. Pericardium: There is no evidence of pericardial effusion. Mitral Valve: The mitral valve is normal in structure. Trivial mitral valve regurgitation. No evidence of mitral valve stenosis. Tricuspid Valve: The tricuspid valve is normal in structure. Tricuspid valve regurgitation is trivial. No  evidence of tricuspid stenosis. Aortic Valve: The aortic valve is tricuspid. Aortic valve regurgitation is trivial. Aortic regurgitation PHT measures 635 msec. Mild aortic valve sclerosis is present, with no evidence of aortic valve stenosis. Pulmonic Valve: The pulmonic valve was normal in structure. Pulmonic valve regurgitation is not visualized. No evidence of pulmonic stenosis. Aorta: The aortic root is normal in size and structure. Venous: The inferior vena cava is normal in size with greater than 50% respiratory variability, suggesting right atrial pressure of 3 mmHg. IAS/Shunts: No atrial level shunt detected by color flow Doppler.  LEFT VENTRICLE PLAX 2D LVIDd:         3.90 cm  Diastology LVIDs:         2.20 cm  LV e' medial:    5.78 cm/s LV PW:         0.90 cm  LV E/e' medial:  14.9 LV IVS:        1.10 cm  LV e' lateral:   12.60 cm/s LVOT diam:     1.80 cm  LV E/e' lateral: 6.8 LV SV:         61 LV SV Index:   35 LVOT Area:     2.54 cm  RIGHT VENTRICLE RV S prime:  10.30 cm/s TAPSE (M-mode): 1.3 cm LEFT ATRIUM           Index       RIGHT ATRIUM          Index LA diam:      3.20 cm 1.85 cm/m  RA Area:     8.69 cm LA Vol (A2C): 29.5 ml 17.10 ml/m RA Volume:   13.00 ml 7.53 ml/m LA Vol (A4C): 33.5 ml 19.41 ml/m  AORTIC VALVE LVOT Vmax:   121.00 cm/s LVOT Vmean:  80.400 cm/s LVOT VTI:    0.239 m AI PHT:      635 msec  AORTA Ao Root diam: 2.70 cm Ao Asc diam:  3.30 cm MITRAL VALVE               TRICUSPID VALVE MV Area (PHT): 4.15 cm    TR Peak grad:   19.9 mmHg MV Decel Time: 183 msec    TR Vmax:        223.00 cm/s MV E velocity: 86.00 cm/s MV A velocity: 85.00 cm/s  SHUNTS MV E/A ratio:  1.01        Systemic VTI:  0.24 m                            Systemic Diam: 1.80 cm Olga Millers MD Electronically signed by Olga Millers MD Signature Date/Time: 11/22/2020/11:37:06 AM    Final    CT HEAD CODE STROKE WO CONTRAST  Result Date: 11/21/2020 CLINICAL DATA:  Code stroke.  Neuro deficit, acute, stroke  suspected EXAM: CT HEAD WITHOUT CONTRAST TECHNIQUE: Contiguous axial images were obtained from the base of the skull through the vertex without intravenous contrast. COMPARISON:  CT head 09/26/2020. FINDINGS: Brain: No evidence of acute large vascular territory infarction, hemorrhage, hydrocephalus, extra-axial collection or mass lesion/mass effect. Similar patchy white matter hypoattenuation, compatible with chronic microvascular ischemic disease. Probable remote lacunar infarcts in bilateral basal ganglia and right corona radiata, similar to priors Vascular: No hyperdense vessel identified. Skull: No acute fracture. Sinuses/Orbits: Clear sinuses.  Unremarkable orbits. Other: No mastoid effusions ASPECTS [facility] Stroke Program Early CT Score) total score (0-10 with 10 being normal): 10. IMPRESSION: 1. No evidence of acute large vascular territory infarct or acute hemorrhage. 2. ASPECTS is 10 3. 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 4. Partially empty sella, which is often a normal anatomic variant but can be associated with idiopathic intracranial hypertension. Findings discussed with Dr. Lorra Hals via telephone at 3:20 p.m. Electronically Signed   By: Feliberto Harts M.D.   On: 11/21/2020 15:32        Scheduled Meds:  aspirin EC  81 mg Oral QPM   atorvastatin  80 mg Oral q1800   carbidopa-levodopa  1.5 tablet Oral TID   [START ON 12/03/2020] cyanocobalamin  1,000 mcg Intramuscular Q30 days   enoxaparin (LOVENOX) injection  40 mg Subcutaneous Q24H   insulin aspart  0-9 Units Subcutaneous TID WC   metoprolol tartrate  50 mg Oral BID   mirtazapine  7.5 mg Oral QHS   pantoprazole (PROTONIX) IV  40 mg Intravenous Q12H   potassium chloride  40 mEq Oral Daily   senna  1 tablet Oral QPM   sertraline  100 mg Oral Daily   Continuous Infusions:  dextrose 5% lactated ringers 100 mL/hr at 11/23/20 0443      LOS: 2  days    Time spent: 35 minutes.     Alba Cory, MD [facility]   If 7PM-7AM, please contact night-coverage [email or web address]  11/23/2020, 1:56 PM

## 2020-11-23 NOTE — Progress Notes (Signed)
Physical Therapy Treatment Patient Details Name: Amanda Fritz MRN: [id number] DOB: Jan 08, 1956 Today's Date: 11/23/2020   History of Present Illness Pt is a 65 y.o. female admitted 9/10 for seizure-like activity and left-sided weakness. PMH significant of multiple medical issues including asthma, anxiety, type 2 diabetes, fibromyalgia, coronary artery disease, history of CVA, hypertension, neuropathy, history of subdural hematoma, and Parkinson's.    PT Comments    Pt is progressing toward her goals. Pt required min guard to min assist for bed mobility; min assist for transfers for linen changes and pericare. Ambulated with RW in hallway with min guard to min assist especially for correction of L drifting to avoid objects. Pt able to follow one step commands consistently and with increased time. Current discharge plan remains appropriate. We will continue to follow her acutely to promote independence with functional ambulation.   Recommendations for follow up therapy are one component of a multi-disciplinary discharge planning process, led by the attending physician.  Recommendations may be updated based on patient status, additional functional criteria and insurance authorization.  Follow Up Recommendations  SNF (Return to previous facility, if possible.)     Equipment Recommendations  None recommended by PT    Recommendations for Other Services       Precautions / Restrictions Precautions Precautions: Fall Restrictions Weight Bearing Restrictions: No     Mobility  Bed Mobility Overal bed mobility: Needs Assistance Bed Mobility: Supine to Sit     Supine to sit: Min assist;HOB elevated;Min guard     General bed mobility comments: Pt required min assist for scooting hips EOB with max verbal cuing for sequencing.    Transfers Overall transfer level: Needs assistance Equipment used: Rolling walker (2 wheeled) Transfers: Sit to/from Stand Sit to Stand: Min assist;+2  physical assistance         General transfer comment: Pt required min assist to power up and stabilize balance, cues for sequencing, pelvic tilt. Pericare completed with pt performing sit to stand transfer x6.  Ambulation/Gait Ambulation/Gait assistance: Min guard;Min assist Gait Distance (Feet): 95 Feet Assistive device: Rolling walker (2 wheeled) Gait Pattern/deviations: Step-through pattern;Decreased stride length;Decreased dorsiflexion - right;Decreased dorsiflexion - left;Drifts right/left Gait velocity: decreased   General Gait Details: Pt required min guard to min assist for ambulation with RW in hallway, no overt LOB noted. Pt demonstrated strong L drift that required max multimodal cuing to correct. During gait, pt able to look down and identify PT shoe color. Moderate verbal cues for proximity to AD.   Stairs             Wheelchair Mobility    Modified Rankin (Stroke Patients Only) Modified Rankin (Stroke Patients Only) Pre-Morbid Rankin Score: Moderately severe disability Modified Rankin: Moderately severe disability     Balance Overall balance assessment: Needs assistance Sitting-balance support: Feet supported;No upper extremity supported Sitting balance-Leahy Scale: Fair     Standing balance support: Bilateral upper extremity supported;During functional activity Standing balance-Leahy Scale: Poor Standing balance comment: Pt reliant on BUE on RW.                            Cognition Arousal/Alertness: Awake/alert Behavior During Therapy: Flat affect Overall Cognitive Status: Impaired/Different from baseline Area of Impairment: Safety/judgement;Awareness;Following commands                       Following Commands: Follows one step commands consistently;Follows one step commands with increased time Safety/Judgement:  Decreased awareness of safety;Decreased awareness of deficits Awareness: Intellectual   General Comments: Pt with  flat affect but follows simple commands consistently. Some impulsivity. Pt's daughter in room reporting that the pt has had varied cognitive states and her speech changes, "she was in here talking up a storm." Speech during this session was mostly a response of "okay," and other single word answers.      Exercises      General Comments General comments (skin integrity, edema, etc.): Pt's daughter in room. Linens changed during pericare.      Pertinent Vitals/Pain Pain Assessment: No/denies pain Faces Pain Scale: No hurt    Home Living                      Prior Function            PT Goals (current goals can now be found in the care plan section) Acute Rehab PT Goals Patient Stated Goal: unable to state PT Goal Formulation: With family Time For Goal Achievement: 12/06/20 Potential to Achieve Goals: Fair Progress towards PT goals: Progressing toward goals    Frequency    Min 3X/week      PT Plan Current plan remains appropriate    Co-evaluation              AM-PAC PT "6 Clicks" Mobility   Outcome Measure  Help needed turning from your back to your side while in a flat bed without using bedrails?: A Little Help needed moving from lying on your back to sitting on the side of a flat bed without using bedrails?: A Little Help needed moving to and from a bed to a chair (including a wheelchair)?: A Little Help needed standing up from a chair using your arms (e.g., wheelchair or bedside chair)?: A Little Help needed to walk in hospital room?: A Lot Help needed climbing 3-5 steps with a railing? : A Lot 6 Click Score: 16    End of Session   Activity Tolerance: Patient tolerated treatment well Patient left: in chair;with call bell/phone within reach;with chair alarm set;with family/visitor present (Daughter present) Nurse Communication: Mobility status PT Visit Diagnosis: Other abnormalities of gait and mobility (R26.89);Muscle weakness (generalized)  (M62.81);Other symptoms and signs involving the nervous system (R29.898)     Time: 0388-8280 PT Time Calculation (min) (ACUTE ONLY): 29 min  Charges:  $Gait Training: 8-22 mins $Therapeutic Activity: 8-22 mins                     Johnn Hai, SPT Johnn Hai 11/23/2020, 3:40 PM

## 2020-11-24 ENCOUNTER — Inpatient Hospital Stay (HOSPITAL_COMMUNITY): Payer: Medicare Other

## 2020-11-24 DIAGNOSIS — R569 Unspecified convulsions: Secondary | ICD-10-CM | POA: Diagnosis not present

## 2020-11-24 DIAGNOSIS — R29818 Other symptoms and signs involving the nervous system: Secondary | ICD-10-CM | POA: Diagnosis not present

## 2020-11-24 LAB — CBC
HCT: 33.6 % — ABNORMAL LOW (ref 36.0–46.0)
Hemoglobin: 10.8 g/dL — ABNORMAL LOW (ref 12.0–15.0)
MCH: 25 pg — ABNORMAL LOW (ref 26.0–34.0)
MCHC: 32.1 g/dL (ref 30.0–36.0)
MCV: 77.8 fL — ABNORMAL LOW (ref 80.0–100.0)
Platelets: 181 10*3/uL (ref 150–400)
RBC: 4.32 MIL/uL (ref 3.87–5.11)
RDW: 14.9 % (ref 11.5–15.5)
WBC: 6.1 10*3/uL (ref 4.0–10.5)
nRBC: 0 % (ref 0.0–0.2)

## 2020-11-24 LAB — BASIC METABOLIC PANEL
Anion gap: 7 (ref 5–15)
BUN: 6 mg/dL — ABNORMAL LOW (ref 8–23)
CO2: 25 mmol/L (ref 22–32)
Calcium: 9 mg/dL (ref 8.9–10.3)
Chloride: 108 mmol/L (ref 98–111)
Creatinine, Ser: 0.65 mg/dL (ref 0.44–1.00)
GFR, Estimated: >60 mL/min (ref >60)
Glucose, Bld: 173 mg/dL — ABNORMAL HIGH (ref 70–99)
Potassium: 4.1 mmol/L (ref 3.5–5.1)
Sodium: 140 mmol/L (ref 135–145)

## 2020-11-24 LAB — GLUCOSE, CAPILLARY
Glucose-Capillary: 103 mg/dL — ABNORMAL HIGH (ref 70–99)
Glucose-Capillary: 177 mg/dL — ABNORMAL HIGH (ref 70–99)
Glucose-Capillary: 139 mg/dL — ABNORMAL HIGH (ref 70–99)
Glucose-Capillary: 158 mg/dL — ABNORMAL HIGH (ref 70–99)

## 2020-11-24 LAB — TSH: TSH: 1.356 u[IU]/mL (ref 0.350–4.500)

## 2020-11-24 MED ORDER — LEVETIRACETAM 250 MG PO TABS
250.0000 mg | ORAL_TABLET | Freq: Two times a day (BID) | ORAL | Status: DC
  Administered 2020-11-24 – 2020-11-27 (×6): 250 mg via ORAL
  Filled 2020-11-24 (×6): qty 1

## 2020-11-24 MED ORDER — FUROSEMIDE 40 MG PO TABS
40.0000 mg | ORAL_TABLET | Freq: Every day | ORAL | Status: DC
  Administered 2020-11-24 – 2020-11-27 (×4): 40 mg via ORAL
  Filled 2020-11-24 (×4): qty 1

## 2020-11-24 NOTE — Progress Notes (Addendum)
Neurology Progress Note  S: Says she is OK. Does not remember the spell the RN charted from overnight. Confused. Can not reliably participate in ROS due to altered mental status.   Per RN note overnight: "Pt had at least a 3 min spell where she didn't respond even to painful stimuli. She wasn't tracking with her eyes. When I came into the rm she had a blank stare and wasn't responding. Marcy Salvo RN came into rm to assess and she wasn't responding to him either. She finally turned her head toward him but still wasn't responding to verbal or painful stimuli."  Per daytime RN, he was reported that patient slept 30 minutes after the spell.   O: Current vital signs: BP (!) 141/64 (BP Location: Right Arm)   Pulse (!) 51   Temp 98.7 F (37.1 C) (Oral)   Resp 14   Ht 5\' 3"  (1.6 m)   Wt 69.4 kg   SpO2 100%   BMI 27.10 kg/m  Vital signs in last 24 hours: Temp:  [97.9 F (36.6 C)-98.7 F (37.1 C)] 98.7 F (37.1 C) (09/13 0755) Pulse Rate:  [51-65] 51 (09/13 0755) Resp:  [14-20] 14 (09/13 0755) BP: (108-165)/(61-89) 141/64 (09/13 0755) SpO2:  [98 %-100 %] 100 % (09/13 0755)  GENERAL: Chronically ill appearing female. Awake, alert in NAD. HEENT: Normocephalic and atraumatic. LUNGS: Normal respiratory effort.  Ext: warm.  NEURO:  Mental Status: Alert and oriented to self and age. Thinks she is in an egg house. No answer to time questions.   Speech/Language: speech is without aphasia. NP noticed dysarthria about every 6 words or so.   Cranial Nerves:  08-01-1993. Smile is symmetrical. Hearing intact to voice.  Motor:  Moves all 4 extremities spontaneously.  Coordination: No twitching, clonus, jerking, or shaking noted.  Medications  Current Facility-Administered Medications:    acetaminophen (TYLENOL) tablet 650 mg, 650 mg, Oral, Q4H PRN **OR** acetaminophen (TYLENOL) 160 MG/5ML solution 650 mg, 650 mg, Per Tube, Q4H PRN **OR** acetaminophen (TYLENOL) suppository 650 mg, 650 mg,  Rectal, Q4H PRN, Control and instrumentation engineer, MD, 650 mg at 11/22/20 0354   aspirin EC tablet 81 mg, 81 mg, Oral, QPM, 01/22/21, MD, 81 mg at 11/23/20 1723   atorvastatin (LIPITOR) tablet 80 mg, 80 mg, Oral, q1800, 01/23/21, MD, 80 mg at 11/23/20 1724   carbidopa-levodopa (SINEMET IR) 25-100 MG per tablet immediate release 1.5 tablet, 1.5 tablet, Oral, TID, 01/23/21, MD, 1.5 tablet at 11/23/20 2240   [START ON 12/03/2020] cyanocobalamin ((VITAMIN B-12)) injection 1,000 mcg, 1,000 mcg, Intramuscular, Q30 days, 12/05/2020, MD   enoxaparin (LOVENOX) injection 40 mg, 40 mg, Subcutaneous, Q24H, Floydene Flock, MD, 40 mg at 11/23/20 1724   insulin aspart (novoLOG) injection 0-9 Units, 0-9 Units, Subcutaneous, TID WC, 01/23/21, MD, 1 Units at 11/23/20 0654   metoprolol tartrate (LOPRESSOR) tablet 50 mg, 50 mg, Oral, BID, 01/23/21, MD, 50 mg at 11/23/20 2241   mirtazapine (REMERON) tablet 7.5 mg, 7.5 mg, Oral, QHS, 01/23/21, MD, 7.5 mg at 11/23/20 2241   nitroGLYCERIN (NITROSTAT) SL tablet 0.4 mg, 0.4 mg, Sublingual, Q5 min PRN, 01/23/21, MD   pantoprazole (PROTONIX) injection 40 mg, 40 mg, Intravenous, Q12H, Regalado, Belkys A, MD, 40 mg at 11/23/20 2242   potassium chloride (KLOR-CON) CR tablet 40 mEq, 40 mEq, Oral, Daily, 2243, MD, 40 mEq at 11/23/20 1012   senna (SENOKOT) tablet 8.6 mg,  1 tablet, Oral, QPM, Floydene Flock, MD, 8.6 mg at 11/23/20 1723   senna-docusate (Senokot-S) tablet 1 tablet, 1 tablet, Oral, QHS PRN, Floydene Flock, MD   sertraline (ZOLOFT) tablet 100 mg, 100 mg, Oral, Daily, Floydene Flock, MD, 100 mg at 11/23/20 1012  Pertinent Labs Vitamin B12 554. HbA1c 6.5%. LDL 27. UA negative.   Imaging MRI Brain  Somewhat limited exam due to motion artifact. No abnormal or pathologic enhancement within the brain following contrast administration.  EEG This technically difficult study is within normal limits. No  seizures or epileptiform discharges were seen throughout the recording. If suspicion for interictal activity remains a concern, a repeat study can be considered.    Assessment: 65 yo female who presented after witnessed seizure like activity at her memory care facility. After seizure, patient had left hemiparesis. She was a stroke code by teleneurology and no stroke or anatomical abnormalities were found on imaging. The event was thought to be seizure with Todd's paralysis. AEDs were deferred since seizure was a single event. However, last pm, patient had a spell which seems to resemble seizure activity. Place LTM EEG and try to capture a spell. There was a sub optimal EEG in 2019 with evidence of epileptiform discharge but out patient neurologist did not start medications repeated EEG later which did not show epileptiform discharges. Based on her occasional, brief stereotypic events one of which was witnessed lasting ~3 minutes, we cannot completely exclude seizure and will start trial of low dose Keppra and have her follow up with outpatient neurology.   Impression: -New onset seizure.    Recommendations/Plan:  -LTM overnight with video.  -Keppra 250mg  po q12 hours (liquid or IV if not taking pos).  -Continue to hold Aricept.  -Continue seizure precautions.  - Follow up with outpatient neurology - Referral placed.  - Please call for questions or concerns.  Pt seen by , MSN, APN-BC/Nurse Practitioner/Neuro and MD. Pager: Jimmye Norman  Patient seen, examined, labs,vitals and notes reviewed. Discussed plan with [phone], NP and agree with assessment and plan as documented above. I have independently reviewed the chart, obtained history, review of systems and examined the patient.  Electronically signed by:  Jimmye Norman, MD Page: Marisue Humble 11/25/2020, 10:13 AM  11/27/2020, MD Stroke Neurology Page: Marisue Humble

## 2020-11-24 NOTE — Progress Notes (Signed)
  Speech Language Pathology Treatment: Dysphagia  Patient Details Name: Amanda Fritz MRN: [id number] DOB: 1955-05-27 Today's Date: 11/24/2020 Time: 2633-3545 SLP Time Calculation (min) (ACUTE ONLY): 12 min  Assessment / Plan / Recommendation Clinical Impression  Pt was seen for skilled observation of diet tolerance. Nurse Tech reported coughing with breakfast but none with lunch; pt reported getting 'strangled' during both meals. Oral mechanism exam revealed pt is edentulous with no dentures but was otherwise unremarkable. SLP trialed thin liquids, puree, and regular solids. No oral phase impairments were observed across all consistencies. Pt exhibited an immediate cough with large/consecutive sips of thin liquid and delayed throat clear with puree. No overt s/s of penetration/aspiration were observed when pt took small sips/bites; pt reports taking small sips at baseline. Recommend continue with Dys2/thin liquid diet to help with edentulous mastication. Advise pt to take small sips/bites to increase swallow safety. Administer medications whole in puree. SLP to discharge. Recommend f/u with ST at next venue of care for possible diet upgrade as pt tolerates.    HPI HPI: Pt is a 65 y.o. female admitted 9/10 for seizure-like activity and left-sided weakness. MRI brain negative for acute infarct. 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 EEG 9/11 WNL. PMH: asthma, anxiety, type 2 diabetes, fibromyalgia, coronary artery disease, history of CVA, hypertension, neuropathy, history of subdural hematoma, and parkinsonism.      SLP Plan  All goals met       Recommendations  Diet recommendations: Dysphagia 2 (fine chop);Thin liquid Liquids provided via: Straw Medication Administration: Whole meds with puree Supervision: Intermittent supervision to cue for compensatory strategies Compensations: Minimize environmental distractions Postural Changes and/or Swallow Maneuvers: Seated  upright 90 degrees                Oral Care Recommendations: Oral care BID Follow up Recommendations: Skilled Nursing facility SLP Visit Diagnosis: Dysphagia, unspecified (R13.10) Plan: All goals met       GO              Dewitt Rota, SLP-Student   Dewitt Rota 11/24/2020, 2:52 PM

## 2020-11-24 NOTE — Care Management Important Message (Signed)
Important Message  Patient Details  Name: Amanda Fritz MRN: [id number] Date of Birth: 08-09-1955   Medicare Important Message Given:  Yes     [name] 11/24/2020, 1:12 PM

## 2020-11-24 NOTE — Progress Notes (Signed)
PROGRESS NOTE    Amanda Fritz  RDE:[id number] DOB: 1955-12-15 DOA: 11/21/2020 PCP: Galvin Proffer, MD   Brief Narrative: 65 year old with past medical history significant for asthma, anxiety, type 2 diabetes, fibromyalgia, CAD, history of CVA, hypertension, neuropathy, history of subdural hematoma, Parkinson's presented with seizure-like activity and left-sided weakness.  Neurology was consulted recommended work-up for TIA or stroke and to rule out seizure.    Assessment & Plan:   Active Problems:   Left-sided weakness   Focal neurological deficit  1-Left-sided weakness/seizure-like activity: -MRI; no evidence of acute infarct, moderate chronic microvascular ischemic changes with small remote infarct in the right basal ganglia, left frontal white matter and left cerebellum.  Prior microhemorrhage in the right thalamus and left cerebellum. -MRA head: Moderate to severely motion degraded study.  No evidence of large vessel occlusion -MRA neck: Probably mild to moderate stenosis of the mid right cervical ICA. -LDL 27 and A1c 6.5. -B 12 631 -Initial EEG.negative for seizure.  -ECHO; normal EF, grade 1 diastolic dysfunction.  -Patient had episode at 4 am, she was unresponsive, wasn't tracking with her eyes. She is now alert, and answering questions.  -Discussed with neurology plan for continuous EEG, they are  considering start Keppra.    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   3-Dementia: -Holding Aricept per neurology recommendation.   4-Type 2 Diabetes: Hypoglycemia; received  D 5 IV fluids during this admission. NSL fluids/ .  Hold insulin. Monitor CBG.  5-HLD;  Continue with statins.  6-Hypertension: Continue with Metoprolol. Resume lasix.  7-CAD: Continue with aspirin, as needed nitroglycerin. Per admitting MD patient complaining of chest pain.  Troponin negative 8-Epigastric pain: Started  PPI.  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  Repeat B 12 level. 554.  Estimated body mass index is 27.1 kg/m as calculated from the following:   Height as of this encounter: 5\' 3"  (1.6 m).   Weight as of this encounter: 69.4 kg.   DVT prophylaxis: Lovenox Code Status: Full Code Family Communication: Care discussed with daughter 9/13. Disposition Plan:  Status is: Inpatient  Remains inpatient appropriate because:IV treatments appropriate due to intensity of illness or inability to take PO  Dispo: The patient is from: SNF              Anticipated d/c is to: SNF              Patient currently is not medically stable to d/c.   Difficult to place patient No        Consultants:  Neurology   Procedures:  EEG ECHO   Antimicrobials:    Subjective: Events from last night notice. Unresponsive, AMS episode. Concern for seizure.  She is alert, oriented times 3. Denies pain. Denies dyspnea   Objective: Vitals:   11/23/20 2035 11/23/20 2358 11/24/20 0355 11/24/20 0755  BP: 130/71 138/61 (!) 165/62 (!) 141/64  Pulse: 65 65 (!) 59 (!) 51  Resp: 18 20 18 14   Temp: 98.6 F (37 C)  97.9 F (36.6 C) 98.7 F (37.1 C)  TempSrc: Oral  Axillary Oral  SpO2: 100% 100% 98% 100%  Weight:      Height:        Intake/Output Summary (Last 24 hours) at 11/24/2020 1053 Last data filed at 11/24/2020 0900 Gross per 24 hour  Intake 240 ml  Output --  Net 240 ml    Filed Weights   11/21/20 1410  Weight: 69.4 kg    Examination:  General exam: NAD Respiratory system: CTA Cardiovascular system: S 1, S 2 RR Gastrointestinal system: BS present, soft, nt Central nervous system: Alert, Follows command Extremities: No edema  Data Reviewed: I have personally reviewed following labs and imaging studies  CBC: Recent Labs  Lab 11/21/20 1449 11/21/20 1505 11/22/20 0255 11/22/20 1220  WBC 7.0  --  7.3 5.5  NEUTROABS 5.3  --   --   --   HGB 10.3* 11.2* 10.2* 9.8*  HCT 32.6* 33.0*  31.6* 31.3*  MCV 78.7*  --  76.5* 79.4*  PLT 300  --  286 257    Basic Metabolic Panel: Recent Labs  Lab 11/21/20 1449 11/21/20 1505 11/22/20 0255 11/22/20 1220 11/24/20 0923  NA 135 138 139 139 140  K 3.6 3.8 4.2 3.7 4.1  CL 99 100 105 107 108  CO2 25  --  26 24 25   GLUCOSE 158* 161* 98 68* 173*  BUN 9 7* 5* 5* 6*  CREATININE 0.63 0.50 0.60 0.56 0.65  CALCIUM 8.7*  --  9.3 9.0 9.0    GFR: Estimated Creatinine Clearance: 64.6 mL/min (by C-G formula based on SCr of 0.65 mg/dL). Liver Function Tests: Recent Labs  Lab 11/21/20 1449 11/22/20 0255  AST 25 23  ALT 6 15  ALKPHOS 114 97  BILITOT 0.3 0.5  PROT 7.6 7.2  ALBUMIN 3.5 3.2*    No results for input(s): LIPASE, AMYLASE in the last 168 hours. No results for input(s): AMMONIA in the last 168 hours. Coagulation Profile: Recent Labs  Lab 11/21/20 1449  INR 1.0    Cardiac Enzymes: No results for input(s): CKTOTAL, CKMB, CKMBINDEX, TROPONINI in the last 168 hours. BNP (last 3 results) No results for input(s): PROBNP in the last 8760 hours. HbA1C: Recent Labs    11/22/20 0255 11/22/20 1415  HGBA1C 6.5* 6.5*    CBG: Recent Labs  Lab 11/23/20 0608 11/23/20 1153 11/23/20 1634 11/23/20 2116 11/24/20 0602  GLUCAP 131* 73 119* 150* 103*    Lipid Profile: Recent Labs    11/22/20 0255 11/22/20 1415  CHOL 81 81  HDL 38* 38*  LDLCALC 32 27  TRIG 55 81  CHOLHDL 2.1 2.1    Thyroid Function Tests: No results for input(s): TSH, T4TOTAL, FREET4, T3FREE, THYROIDAB in the last 72 hours. Anemia Panel: Recent Labs    11/22/20 1220  VITAMINB12 554    Sepsis Labs: No results for input(s): PROCALCITON, LATICACIDVEN in the last 168 hours.  Recent Results (from the past 240 hour(s))  Resp Panel by RT-PCR (Flu A&B, Covid) Nasopharyngeal Swab     Status: None   Collection Time: 11/21/20  5:02 PM   Specimen: Nasopharyngeal Swab; Nasopharyngeal(NP) swabs in vial transport medium  Result Value Ref Range  Status   SARS Coronavirus 2 by RT PCR NEGATIVE NEGATIVE Final    Comment: (NOTE) SARS-CoV-2 target nucleic acids are NOT DETECTED.  The SARS-CoV-2 RNA is generally detectable in upper respiratory specimens during the acute phase of infection. The lowest concentration of SARS-CoV-2 viral copies this assay can detect is 138 copies/mL. A negative result does not preclude SARS-Cov-2 infection and should not be used as the sole basis for treatment or other patient management decisions. A negative result may occur with  improper specimen collection/handling, submission of specimen other than nasopharyngeal swab, presence of viral mutation(s) within the areas targeted by this assay, and inadequate number of viral copies(<138 copies/mL). A negative result must be  combined with clinical observations, patient history, and epidemiological information. The expected result is Negative.  Fact Sheet for Patients:  BloggerCourse.com  Fact Sheet for Healthcare Providers:  SeriousBroker.it  This test is no t yet approved or cleared by the [place] FDA and  has been authorized for detection and/or diagnosis of SARS-CoV-2 by FDA under an Emergency Use Authorization (EUA). This EUA will remain  in effect (meaning this test can be used) for the duration of the COVID-19 declaration under Section 564(b)(1) of the Act, 21 U.S.C.section 360bbb-3(b)(1), unless the authorization is terminated  or revoked sooner.       Influenza A by PCR NEGATIVE NEGATIVE Final   Influenza B by PCR NEGATIVE NEGATIVE Final    Comment: (NOTE) The Xpert Xpress SARS-CoV-2/FLU/RSV plus assay is intended as an aid in the diagnosis of influenza from Nasopharyngeal swab specimens and should not be used as a sole basis for treatment. Nasal washings and aspirates are unacceptable for Xpert Xpress SARS-CoV-2/FLU/RSV testing.  Fact Sheet for  Patients: BloggerCourse.com  Fact Sheet for Healthcare Providers: SeriousBroker.it  This test is not yet approved or cleared by the [place] FDA and has been authorized for detection and/or diagnosis of SARS-CoV-2 by FDA under an Emergency Use Authorization (EUA). This EUA will remain in effect (meaning this test can be used) for the duration of the COVID-19 declaration under Section 564(b)(1) of the Act, 21 U.S.C. section 360bbb-3(b)(1), unless the authorization is terminated or revoked.  Performed at [facility], [place], [address], [place], [place] [address]           Radiology Studies: MR ANGIO HEAD WO CONTRAST  Result Date: 11/22/2020 CLINICAL DATA:  cva; Stroke, follow up EXAM: MRI HEAD WITHOUT CONTRAST MRA HEAD WITHOUT CONTRAST MRA NECK WITHOUT AND WITH CONTRAST TECHNIQUE: Multiplanar, multi-echo pulse sequences of the brain and surrounding structures were acquired without intravenous contrast. Angiographic images of the Circle of Willis were acquired using MRA technique without intravenous contrast. Angiographic images of the neck were acquired using MRA technique without and with intravenous contrast. Carotid stenosis measurements (when applicable) are obtained utilizing NASCET criteria, using the distal internal carotid diameter as the denominator. CONTRAST:  6.49mL GADAVIST GADOBUTROL 1 MMOL/ML IV SOLN COMPARISON:  CT 11/21/2020. FINDINGS: MRI HEAD FINDINGS Motion limited study.  Within this limitation: Brain: No acute infarction, hemorrhage, hydrocephalus, extra-axial collection or mass lesion. No pathologic intracranial enhancement. Moderate scattered T2 hyperintensities within the supraventricular and pontine white matter, nonspecific but most likely secondary to chronic microvascular ischemic disease given the patient's known risk factors (including diabetes and hypertension). Small remote infarcts in the right basal ganglia,  left frontal white matter and left cerebellum. Small focus of susceptibility artifact within the left cerebellum and right thalamus, likely the sequela of prior microhemorrhage. Vascular: See below. Skull and upper cervical spine: Normal marrow signal. Sinuses/Orbits: Clear sinuses.  Unremarkable orbits. Other: Small bilateral mastoid effusions. MRA HEAD FINDINGS Moderate to severely motion degraded study. This precludes adequate evaluation for, and accurate quantification of, intracranial arterial stenoses. This also precludes adequateevaluation for intracranial aneurysms. Anterior circulation: Bilateral intracranial ICAs are patent. Bilateral M1 MCAs are patent. Limited evaluation of the more distal MCA branches with some vascular flow related signal bilaterally. Bilateral A1 ACAs appear to be grossly patent. A2 ACA evaluation is essentially nondiagnostic due to extensive motion. Posterior circulation: Bilateral intradural vertebral arteries, basilar artery and proximal posterior cerebral arteries are patent. MRA NECK FINDINGS Motion limited study. Aortic arch: Great vessel origins are patent. Right  carotid system: Patent. Probable mild-to-moderate stenosis of the mid right ICA. Left carotid system: Patent.  No evidence of high-grade stenosis. Vertebral arteries: Patent.  No evidence of high-grade stenosis. IMPRESSION: MRI: 1. No evidence of acute intracranial abnormality on this motion limited exam. Specifically, no acute infarct. 2. 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 3. Prior microhemorrhages in the right thalamus and left cerebellum, potentially hypertensive given location and the patient's known history of hypertension. MRA Head: 1. No evidence of large vessel occlusion. 2. Moderate to severely motion degraded study. This precludes adequate evaluation for, and accurate quantification of,  intracranial arterial stenoses. This also precludes adequateevaluation for intracranial aneurysms. A CTA may be able to better characterize if clinically indicated. MRA Neck: 1. Motion limited study with patent major arteries in the neck 2. Probable mild-to-moderate stenosis of the mid right cervical ICA. Electronically Signed   By: Feliberto Harts M.D.   On: 11/22/2020 12:34   MR ANGIO NECK W WO CONTRAST  Result Date: 11/22/2020 CLINICAL DATA:  cva; Stroke, follow up EXAM: MRI HEAD WITHOUT CONTRAST MRA HEAD WITHOUT CONTRAST MRA NECK WITHOUT AND WITH CONTRAST TECHNIQUE: Multiplanar, multi-echo pulse sequences of the brain and surrounding structures were acquired without intravenous contrast. Angiographic images of the Circle of Willis were acquired using MRA technique without intravenous contrast. Angiographic images of the neck were acquired using MRA technique without and with intravenous contrast. Carotid stenosis measurements (when applicable) are obtained utilizing NASCET criteria, using the distal internal carotid diameter as the denominator. CONTRAST:  6.66mL GADAVIST GADOBUTROL 1 MMOL/ML IV SOLN COMPARISON:  CT 11/21/2020. FINDINGS: MRI HEAD FINDINGS Motion limited study.  Within this limitation: Brain: No acute infarction, hemorrhage, hydrocephalus, extra-axial collection or mass lesion. No pathologic intracranial enhancement. Moderate scattered T2 hyperintensities within the supraventricular and pontine white matter, nonspecific but most likely secondary to chronic microvascular ischemic disease given the patient's known risk factors (including diabetes and hypertension). 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 Small focus of susceptibility artifact within the left cerebellum and right thalamus, likely the sequela of prior microhemorrhage. Vascular: See below. Skull and upper cervical spine: Normal marrow signal. Sinuses/Orbits: Clear sinuses.   Unremarkable orbits. Other: Small bilateral mastoid effusions. MRA HEAD FINDINGS Moderate to severely motion degraded study. This precludes adequate evaluation for, and accurate quantification of, intracranial arterial stenoses. This also precludes adequateevaluation for intracranial aneurysms. Anterior circulation: Bilateral intracranial ICAs are patent. Bilateral M1 MCAs are patent. Limited evaluation of the more distal MCA branches with some vascular flow related signal bilaterally. Bilateral A1 ACAs appear to be grossly patent. A2 ACA evaluation is essentially nondiagnostic due to extensive motion. Posterior circulation: Bilateral intradural vertebral arteries, basilar artery and proximal posterior cerebral arteries are patent. MRA NECK FINDINGS Motion limited study. Aortic arch: Great vessel origins are patent. Right carotid system: Patent. Probable mild-to-moderate stenosis of the mid right ICA. Left carotid system: Patent.  No evidence of high-grade stenosis. Vertebral arteries: Patent.  No evidence of high-grade stenosis. IMPRESSION: MRI: 1. No evidence of acute intracranial abnormality on this motion limited exam. Specifically, no acute infarct. 2. 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 3. Prior microhemorrhages in the right thalamus and left cerebellum, potentially hypertensive given location and the patient's known history of hypertension. MRA Head: 1. No evidence of large  vessel occlusion. 2. Moderate to severely motion degraded study. This precludes adequate evaluation for, and accurate quantification of, intracranial arterial stenoses. This also precludes adequateevaluation for intracranial aneurysms. A CTA may be able to better characterize if clinically indicated. MRA Neck: 1. Motion limited study with patent major arteries in the neck 2. Probable mild-to-moderate stenosis of the mid right  cervical ICA. Electronically Signed   By: Feliberto Harts M.D.   On: 11/22/2020 12:34   MR BRAIN WO CONTRAST  Result Date: 11/22/2020 CLINICAL DATA:  cva; Stroke, follow up EXAM: MRI HEAD WITHOUT CONTRAST MRA HEAD WITHOUT CONTRAST MRA NECK WITHOUT AND WITH CONTRAST TECHNIQUE: Multiplanar, multi-echo pulse sequences of the brain and surrounding structures were acquired without intravenous contrast. Angiographic images of the Circle of Willis were acquired using MRA technique without intravenous contrast. Angiographic images of the neck were acquired using MRA technique without and with intravenous contrast. Carotid stenosis measurements (when applicable) are obtained utilizing NASCET criteria, using the distal internal carotid diameter as the denominator. CONTRAST:  6.78mL GADAVIST GADOBUTROL 1 MMOL/ML IV SOLN COMPARISON:  CT 11/21/2020. FINDINGS: MRI HEAD FINDINGS Motion limited study.  Within this limitation: Brain: No acute infarction, hemorrhage, hydrocephalus, extra-axial collection or mass lesion. No pathologic intracranial enhancement. Moderate scattered T2 hyperintensities within the supraventricular and pontine white matter, nonspecific but most likely secondary to chronic microvascular ischemic disease given the patient's known risk factors (including diabetes and hypertension). 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 Small focus of susceptibility artifact within the left cerebellum and right thalamus, likely the sequela of prior microhemorrhage. Vascular: See below. Skull and upper cervical spine: Normal marrow signal. Sinuses/Orbits: Clear sinuses.  Unremarkable orbits. Other: Small bilateral mastoid effusions. MRA HEAD FINDINGS Moderate to severely motion degraded study. This precludes adequate evaluation for, and accurate quantification of, intracranial arterial stenoses. This also precludes adequateevaluation for intracranial aneurysms.  Anterior circulation: Bilateral intracranial ICAs are patent. Bilateral M1 MCAs are patent. Limited evaluation of the more distal MCA branches with some vascular flow related signal bilaterally. Bilateral A1 ACAs appear to be grossly patent. A2 ACA evaluation is essentially nondiagnostic due to extensive motion. Posterior circulation: Bilateral intradural vertebral arteries, basilar artery and proximal posterior cerebral arteries are patent. MRA NECK FINDINGS Motion limited study. Aortic arch: Great vessel origins are patent. Right carotid system: Patent. Probable mild-to-moderate stenosis of the mid right ICA. Left carotid system: Patent.  No evidence of high-grade stenosis. Vertebral arteries: Patent.  No evidence of high-grade stenosis. IMPRESSION: MRI: 1. No evidence of acute intracranial abnormality on this motion limited exam. Specifically, no acute infarct. 2. 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 3. Prior microhemorrhages in the right thalamus and left cerebellum, potentially hypertensive given location and the patient's known history of hypertension. MRA Head: 1. No evidence of large vessel occlusion. 2. Moderate to severely motion degraded study. This precludes adequate evaluation for, and accurate quantification of, intracranial arterial stenoses. This also precludes adequateevaluation for intracranial aneurysms. A CTA may be able to better characterize if clinically indicated. MRA Neck: 1. Motion limited study with patent major arteries in the neck 2. Probable mild-to-moderate stenosis of the mid right cervical ICA. Electronically Signed   By: Feliberto Harts M.D.   On: 11/22/2020 12:34   MR BRAIN W CONTRAST  Result Date: 11/23/2020 CLINICAL DATA:  Initial evaluation for seizure, abnormal neuro exam. EXAM: MRI HEAD WITH  CONTRAST TECHNIQUE: Multiplanar, multiecho pulse sequences of the  brain and surrounding structures were obtained with intravenous contrast. CONTRAST:  7.84mL GADAVIST GADOBUTROL 1 MMOL/ML IV SOLN COMPARISON:  Previous noncontrast MRIs from 11/22/2020. FINDINGS: Examination is degraded by motion artifact. No abnormal or pathologic enhancement seen within the brain following contrast administration. The remainder of the examination is otherwise unchanged. IMPRESSION: 1. Somewhat limited exam due to motion artifact. 2. No abnormal or pathologic enhancement within the brain following contrast administration. Electronically Signed   By: Rise Mu M.D.   On: 11/23/2020 03:26   EEG adult  Result Date: 11/22/2020 Charlsie Quest, MD     11/22/2020  5:13 PM Patient Name: Amanda Fritz MRN: [id number] Epilepsy Attending: Charlsie Quest Referring Physician/Provider: Dr Doree Albee Date: 11/22/2020 Duration: 22.35 mins Patient history: 64yo f with left-sided weakness/seizure-like activity. EEG to evaluate for seizure Level of alertness: Awake AEDs during EEG study: None Technical aspects: This EEG study was done with scalp electrodes positioned according to the 10-20 International system of electrode placement. Electrical activity was acquired at a sampling rate of 500Hz  and reviewed with a high frequency filter of 70Hz  and a low frequency filter of 1Hz . EEG data were recorded continuously and digitally stored. Description: The posterior dominant rhythm consists of 8-9 Hz activity of moderate voltage (25-35 uV) seen predominantly in posterior head regions, symmetric and reactive to eye opening and eye closing. Physiologic photic driving was not seen during photic stimulation.  Hyperventilation was not performed.   Of note, this study was technically difficult due to significant movement and myogenic artifact. IMPRESSION: This technically difficult study is within normal limits. No seizures or epileptiform discharges were seen throughout the recording. If suspicion for  interictal activity remains a concern, a repeat study can be considered. Priyanka        Scheduled Meds:  aspirin EC  81 mg Oral QPM   atorvastatin  80 mg Oral q1800   carbidopa-levodopa  1.5 tablet Oral TID   [START ON 12/03/2020] cyanocobalamin  1,000 mcg Intramuscular Q30 days   enoxaparin (LOVENOX) injection  40 mg Subcutaneous Q24H   insulin aspart  0-9 Units Subcutaneous TID WC   metoprolol tartrate  50 mg Oral BID   mirtazapine  7.5 mg Oral QHS   pantoprazole (PROTONIX) IV  40 mg Intravenous Q12H   potassium chloride  40 mEq Oral Daily   senna  1 tablet Oral QPM   sertraline  100 mg Oral Daily   Continuous Infusions:     LOS: 3 days    Time spent: 35 minutes.     , MD [facility]   If 7PM-7AM, please contact night-coverage [email or web address]  11/24/2020, 10:53 AM
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
## 2020-11-24 NOTE — Progress Notes (Signed)
Discontinued cEEG study.  Notified [facility].  No skin breakdown observed.

## 2020-11-24 NOTE — Progress Notes (Signed)
Hooked up to LTM. [facility] notified and monitoring.

## 2020-11-24 NOTE — Progress Notes (Signed)
Pt had at least a 3 min spell where she didn't respond even to painful stimuli. She wasn't tracking with her eyes. When I came into the rm she had a blank stare and wasn't responding. Marcy Salvo RN came into rm to assess and she wasn't responding to him either. She finally turned her head toward him but still wasn't responding to verbal or painful stimuli

## 2020-11-24 NOTE — Progress Notes (Signed)
Occupational Therapy Treatment Patient Details Name: Amanda Fritz MRN: [id number] DOB: November 09, 1955 Today's Date: 11/24/2020   History of present illness Pt is a 65 y.o. female admitted 9/10 for seizure-like activity and left-sided weakness. PMH significant of multiple medical issues including asthma, anxiety, type 2 diabetes, fibromyalgia, coronary artery disease, history of CVA, hypertension, neuropathy, history of subdural hematoma, and Parkinson's.   OT comments  Pt received in bed with EEG monitor connected. Pt confused throughout session. Pt unaware of bowel incontinence, required assistance for posterior care in standing. Pt required modA to stand from EOB at RW level, unable to progress mobility further due to pt with BLE weakness and unable to march in place. Pt will continue to benefit from skilled OT services to maximize safety and independence with ADL/IADL and functional mobility. Will continue to follow acutely and progress as tolerated.     Recommendations for follow up therapy are one component of a multi-disciplinary discharge planning process, led by the attending physician.  Recommendations may be updated based on patient status, additional functional criteria and insurance authorization.    Follow Up Recommendations  SNF;Supervision/Assistance - 24 hour    Equipment Recommendations   (defer to post acute setting.)    Recommendations for Other Services      Precautions / Restrictions Precautions Precautions: Fall (seizure precautions) Restrictions Weight Bearing Restrictions: No       Mobility Bed Mobility Overal bed mobility: Needs Assistance Bed Mobility: Supine to Sit;Sit to Supine Rolling: Supervision (max cues for rollimng)   Supine to sit: Min assist;HOB elevated Sit to supine: Min assist;HOB elevated   General bed mobility comments: minA to progress trunk to upright posture, max cues for sequencing, assist to scoot hips, minA with return to supine cues  to scoot toward [facility]    Transfers Overall transfer level: Needs assistance Equipment used: Rolling walker (2 wheeled) Transfers: Sit to/from Stand Sit to Stand: Mod assist         General transfer comment: modA to powerup into standing, attempted to take side steps along EOB but limited due to pt with weakness, inability to take steps and with posterior lean, required return to sitting;pt then completed lateral scoot with minguard assistance and max cues    Balance Overall balance assessment: Needs assistance Sitting-balance support: Feet supported;No upper extremity supported Sitting balance-Leahy Scale: Fair     Standing balance support: Bilateral upper extremity supported;During functional activity Standing balance-Leahy Scale: Poor Standing balance comment: Pt reliant on BUE on RW.                           ADL either performed or assessed with clinical judgement   ADL Overall ADL's : Needs assistance/impaired     Grooming: Minimal assistance Grooming Details (indicate cue type and reason): pt limited with sequencing Upper Body Bathing: Min guard;Sitting   Lower Body Bathing: Minimal assistance;Sit to/from stand       Lower Body Dressing: Moderate assistance Lower Body Dressing Details (indicate cue type and reason): access to BLE Toilet Transfer: Moderate assistance Toilet Transfer Details (indicate cue type and reason): pt stood at EOB, heavy cues for side stepping along EOB, but pt with poor foot clearance and poor sequencing. Toileting- Clothing Manipulation and Hygiene: Total assistance Toileting - Clothing Manipulation Details (indicate cue type and reason): pt unaware she was sitting in stool, required assistance for posterior care     Functional mobility during ADLs: Moderate assistance;Rolling walker General ADL Comments:  pt limited by cognition, generalized deconditioning, decreased activity tolerance     Vision   Vision Assessment?: Vision  impaired- to be further tested in functional context   Perception     Praxis      Cognition Arousal/Alertness: Awake/alert Behavior During Therapy: Flat affect Overall Cognitive Status: No family/caregiver present to determine baseline cognitive functioning Area of Impairment: Safety/judgement;Awareness;Following commands                       Following Commands: Follows one step commands consistently;Follows one step commands with increased time Safety/Judgement: Decreased awareness of safety;Decreased awareness of deficits Awareness: Intellectual   General Comments: pt confused throughout session kept requesting to eat vegetables, pt had just finished lunch upon OT arrival. Pt following one step commands consistently during session. Pt unaware she had a BM but was aware and requesting to urinate        Exercises     Shoulder Instructions       General Comments      Pertinent Vitals/ Pain       Pain Assessment: No/denies pain Faces Pain Scale: No hurt  Home Living     Available Help at Discharge: Skilled Nursing Facility Type of Home: Skilled Nursing Facility                                  Prior Functioning/Environment              Frequency  Min 2X/week        Progress Toward Goals  OT Goals(current goals can now be found in the care plan section)  Progress towards OT goals: Progressing toward goals  Acute Rehab OT Goals Patient Stated Goal: unable to state OT Goal Formulation: Patient unable to participate in goal setting Time For Goal Achievement: 12/06/20 Potential to Achieve Goals: Fair ADL Goals Pt Will Perform Grooming: with set-up;sitting Pt Will Perform Upper Body Dressing: with supervision;sitting Pt Will Perform Lower Body Dressing: with supervision;sit to/from stand;with adaptive equipment Pt Will Transfer to Toilet: with supervision;stand pivot transfer;bedside commode Pt Will Perform Toileting - Clothing  Manipulation and hygiene: with supervision;sit to/from stand;with adaptive equipment  Plan Discharge plan remains appropriate    Co-evaluation                 AM-PAC OT "6 Clicks" Daily Activity     Outcome Measure   Help from another person eating meals?: A Little Help from another person taking care of personal grooming?: A Little Help from another person toileting, which includes using toliet, bedpan, or urinal?: Total Help from another person bathing (including washing, rinsing, drying)?: A Lot Help from another person to put on and taking off regular upper body clothing?: A Little Help from another person to put on and taking off regular lower body clothing?: A Lot 6 Click Score: 14    End of Session Equipment Utilized During Treatment: Rolling walker;Gait belt  OT Visit Diagnosis: Unsteadiness on feet (R26.81);Muscle weakness (generalized) (M62.81)   Activity Tolerance Patient limited by lethargy   Patient Left in bed;with call bell/phone within reach;with bed alarm set;with nursing/sitter in room   Nurse Communication Mobility status        Time: 4235-3614 OT Time Calculation (min): 18 min  Charges: OT General Charges $OT Visit: 1 Visit OT Treatments $Self Care/Home Management : 8-22 mins  Rosey Bath OTR/L Acute Rehabilitation Services Office: [phone]  Rebeca Alert 11/24/2020, 3:49 PM

## 2020-11-25 DIAGNOSIS — R29818 Other symptoms and signs involving the nervous system: Secondary | ICD-10-CM | POA: Diagnosis not present

## 2020-11-25 DIAGNOSIS — R531 Weakness: Secondary | ICD-10-CM | POA: Diagnosis not present

## 2020-11-25 DIAGNOSIS — I2583 Coronary atherosclerosis due to lipid rich plaque: Secondary | ICD-10-CM | POA: Diagnosis not present

## 2020-11-25 DIAGNOSIS — R569 Unspecified convulsions: Secondary | ICD-10-CM | POA: Diagnosis not present

## 2020-11-25 DIAGNOSIS — I2510 Atherosclerotic heart disease of native coronary artery without angina pectoris: Secondary | ICD-10-CM | POA: Diagnosis not present

## 2020-11-25 LAB — GLUCOSE, CAPILLARY
Glucose-Capillary: 125 mg/dL — ABNORMAL HIGH (ref 70–99)
Glucose-Capillary: 130 mg/dL — ABNORMAL HIGH (ref 70–99)
Glucose-Capillary: 113 mg/dL — ABNORMAL HIGH (ref 70–99)
Glucose-Capillary: 136 mg/dL — ABNORMAL HIGH (ref 70–99)

## 2020-11-25 MED ORDER — PANTOPRAZOLE SODIUM 40 MG PO TBEC
40.0000 mg | DELAYED_RELEASE_TABLET | Freq: Two times a day (BID) | ORAL | Status: DC
  Administered 2020-11-25 – 2020-11-27 (×4): 40 mg via ORAL
  Filled 2020-11-25 (×4): qty 1

## 2020-11-25 NOTE — Procedures (Signed)
Patient Name: Amanda Fritz  MRN: [id number]  Epilepsy Attending: Charlsie Quest  Referring Physician/Provider: Jimmye Norman, NP Duration: 11/24/2020 1037 to 1640   Patient history: 64yo f with left-sided weakness/seizure-like activity. EEG to evaluate for seizure   Level of alertness: Awake   AEDs during EEG study: None   Technical aspects: This EEG study was done with scalp electrodes positioned according to the 10-20 International system of electrode placement. Electrical activity was acquired at a sampling rate of 500Hz  and reviewed with a high frequency filter of 70Hz  and a low frequency filter of 1Hz . EEG data were recorded continuously and digitally stored.    Description: The posterior dominant rhythm consists of 8-9 Hz activity of moderate voltage (25-35 uV) seen predominantly in posterior head regions, symmetric and reactive to eye opening and eye closing. Photic driving and  hyperventilation was not performed.      Of note, this study was technically difficult due to significant electrode and movement artifact.    IMPRESSION: This technically difficult study is within normal limits. No seizures or epileptiform discharges were seen throughout the recording.   Kathan Kirker 

## 2020-11-25 NOTE — Progress Notes (Addendum)
PROGRESS NOTE        PATIENT DETAILS Name: Amanda Fritz Age: 65 y.o. Sex: female Date of Birth: May 25, 1955 Admit Date: 11/21/2020 Admitting Physician Floydene Flock, MD [id number], Myrene Galas, MD  Brief Narrative: Patient is a 65 y.o. female with history of parkinsonism, dementia, DM-2, HLD, HTN, CAD-presented with seizure disorder and left-sided weakness.  Subjective:  Lying comfortably in bed-moving all 4 extremities.  Objective: Vitals: Blood pressure 105/66, pulse (!) 51, temperature 97.8 F (36.6 C), temperature source Oral, resp. rate 16, height 5\' 3"  (1.6 m), weight 69.4 kg, SpO2 98 %.   Exam: Gen Exam:Alert awake-not in any distress.  Chronically sick appearing. HEENT:atraumatic, normocephalic Chest: B/L clear to auscultation anteriorly CVS:S1S2 regular Abdomen:soft non tender, non distended Extremities:no edema Neurology: Non focal Skin: no rash   Pertinent labs/x-rays: Hb: 10.8 Creatinine: 0.65 MRI brain: No acute intracranial abnormality. LTM EEG: No seizures.  Assessment/Plan: Seizure with left-sided Todd's paresis: Continue Keppra-she is awake/alert and following simple commands.  She does not appear to have left-sided weakness today.  Patient pulled out LTM EEG leads yesterday-however no seizures for several hours while LTM was on.  Await further recommendations from neurology.  History of parkinsonism: Continue Sinemet  Dementia: Holding Aricept per neurology recommendation.  DM-2 (A1c 6.5 on 9/11): Continue SSI-CBG stable.  Recent Labs    11/24/20 1619 11/24/20 2106 11/25/20 0844  GLUCAP 139* 158* 125*    HTN: BP stable-continue metoprolol/Lasix.  CAD: No anginal symptoms-continue aspirin/statin/beta-blocker  History of vitamin B12 deficiency: Continue supplementation  Mood disorder: Continue Zoloft/Remeron  Procedures : None  Consults: Neuro  DVT Prophylaxis : enoxaparin (LOVENOX) injection 40 mg Start:  11/21/20 1715  Diet: Diet Order             DIET DYS 2 Room service appropriate? Yes with Assist; Fluid consistency: Thin  Diet effective now                    Code Status: Full code   Family Communication: None at bedside  Disposition Plan: Status is: Inpatient  Remains inpatient appropriate because:Inpatient level of care appropriate due to severity of illness  Dispo: The patient is from: Home              Anticipated d/c is to: SNF              Patient currently is not medically stable to d/c.   Difficult to place patient No    Barriers to Discharge: Seizure-work-up in progress-SNF planned on discharge.  Antimicrobial agents: Anti-infectives (From admission, onward)    None        Time spent: 25 minutes-Greater than 50% of this time was spent in counseling, explanation of diagnosis, planning of further management, and coordination of care.  MEDICATIONS: Scheduled Meds:  aspirin EC  81 mg Oral QPM   atorvastatin  80 mg Oral q1800   carbidopa-levodopa  1.5 tablet Oral TID   [START ON 12/03/2020] cyanocobalamin  1,000 mcg Intramuscular Q30 days   enoxaparin (LOVENOX) injection  40 mg Subcutaneous Q24H   furosemide  40 mg Oral Daily   insulin aspart  0-9 Units Subcutaneous TID WC   levETIRAcetam  250 mg Oral Q12H   metoprolol tartrate  50 mg Oral BID   mirtazapine  7.5 mg Oral QHS   pantoprazole (PROTONIX) IV  40 mg Intravenous Q12H   senna  1 tablet Oral QPM   sertraline  100 mg Oral Daily   Continuous Infusions: PRN Meds:.acetaminophen **OR** acetaminophen (TYLENOL) oral liquid 160 mg/5 mL **OR** acetaminophen, nitroGLYCERIN, senna-docusate  LABORATORY DATA: CBC: Recent Labs  Lab 11/21/20 1449 11/21/20 1505 11/22/20 0255 11/22/20 1220 11/24/20 0923  WBC 7.0  --  7.3 5.5 6.1  NEUTROABS 5.3  --   --   --   --   HGB 10.3* 11.2* 10.2* 9.8* 10.8*  HCT 32.6* 33.0* 31.6* 31.3* 33.6*  MCV 78.7*  --  76.5* 79.4* 77.8*  PLT 300  --  286 257 181     Basic Metabolic Panel: Recent Labs  Lab 11/21/20 1449 11/21/20 1505 11/22/20 0255 11/22/20 1220 11/24/20 0923  NA 135 138 139 139 140  K 3.6 3.8 4.2 3.7 4.1  CL 99 100 105 107 108  CO2 25  --  26 24 25   GLUCOSE 158* 161* 98 68* 173*  BUN 9 7* 5* 5* 6*  CREATININE 0.63 0.50 0.60 0.56 0.65  CALCIUM 8.7*  --  9.3 9.0 9.0    GFR: Estimated Creatinine Clearance: 64.6 mL/min (by C-G formula based on SCr of 0.65 mg/dL).  Liver Function Tests: Recent Labs  Lab 11/21/20 1449 11/22/20 0255  AST 25 23  ALT 6 15  ALKPHOS 114 97  BILITOT 0.3 0.5  PROT 7.6 7.2  ALBUMIN 3.5 3.2*   No results for input(s): LIPASE, AMYLASE in the last 168 hours. No results for input(s): AMMONIA in the last 168 hours.  Coagulation Profile: Recent Labs  Lab 11/21/20 1449  INR 1.0    Cardiac Enzymes: No results for input(s): CKTOTAL, CKMB, CKMBINDEX, TROPONINI in the last 168 hours.  BNP (last 3 results) No results for input(s): PROBNP in the last 8760 hours.  Lipid Profile: Recent Labs    11/22/20 1415  CHOL 81  HDL 38*  LDLCALC 27  TRIG 81  CHOLHDL 2.1    Thyroid Function Tests: Recent Labs    11/22/20 1220  TSH 1.356    Anemia Panel: Recent Labs    11/22/20 1220  VITAMINB12 554    Urine analysis:    Component Value Date/Time   COLORURINE YELLOW 11/21/2020 1504   APPEARANCEUR CLEAR 11/21/2020 1504   LABSPEC 1.015 11/21/2020 1504   PHURINE 5.5 11/21/2020 1504   GLUCOSEU NEGATIVE 11/21/2020 1504   HGBUR NEGATIVE 11/21/2020 1504   BILIRUBINUR NEGATIVE 11/21/2020 1504   KETONESUR NEGATIVE 11/21/2020 1504   PROTEINUR NEGATIVE 11/21/2020 1504   NITRITE NEGATIVE 11/21/2020 1504   LEUKOCYTESUR NEGATIVE 11/21/2020 1504    Sepsis Labs: Lactic Acid, Venous No results found for: LATICACIDVEN  MICROBIOLOGY: Recent Results (from the past 240 hour(s))  Resp Panel by RT-PCR (Flu A&B, Covid) Nasopharyngeal Swab     Status: None   Collection Time: 11/21/20  5:02 PM    Specimen: Nasopharyngeal Swab; Nasopharyngeal(NP) swabs in vial transport medium  Result Value Ref Range Status   SARS Coronavirus 2 by RT PCR NEGATIVE NEGATIVE Final    Comment: (NOTE) SARS-CoV-2 target nucleic acids are NOT DETECTED.  The SARS-CoV-2 RNA is generally detectable in upper respiratory specimens during the acute phase of infection. The lowest concentration of SARS-CoV-2 viral copies this assay can detect is 138 copies/mL. A negative result does not preclude SARS-Cov-2 infection and should not be used as the sole basis for treatment or other patient management decisions. A negative result may occur with  improper specimen collection/handling,  submission of specimen other than nasopharyngeal swab, presence of viral mutation(s) within the areas targeted by this assay, and inadequate number of viral copies(<138 copies/mL). A negative result must be combined with clinical observations, patient history, and epidemiological information. The expected result is Negative.  Fact Sheet for Patients:  BloggerCourse.com  Fact Sheet for Healthcare Providers:  SeriousBroker.it  This test is no t yet approved or cleared by the [place] FDA and  has been authorized for detection and/or diagnosis of SARS-CoV-2 by FDA under an Emergency Use Authorization (EUA). This EUA will remain  in effect (meaning this test can be used) for the duration of the COVID-19 declaration under Section 564(b)(1) of the Act, 21 U.S.C.section 360bbb-3(b)(1), unless the authorization is terminated  or revoked sooner.       Influenza A by PCR NEGATIVE NEGATIVE Final   Influenza B by PCR NEGATIVE NEGATIVE Final    Comment: (NOTE) The Xpert Xpress SARS-CoV-2/FLU/RSV plus assay is intended as an aid in the diagnosis of influenza from Nasopharyngeal swab specimens and should not be used as a sole basis for treatment. Nasal washings and aspirates are  unacceptable for Xpert Xpress SARS-CoV-2/FLU/RSV testing.  Fact Sheet for Patients: BloggerCourse.com  Fact Sheet for Healthcare Providers: SeriousBroker.it  This test is not yet approved or cleared by the [place] FDA and has been authorized for detection and/or diagnosis of SARS-CoV-2 by FDA under an Emergency Use Authorization (EUA). This EUA will remain in effect (meaning this test can be used) for the duration of the COVID-19 declaration under Section 564(b)(1) of the Act, 21 U.S.C. section 360bbb-3(b)(1), unless the authorization is terminated or revoked.  Performed at [facility], [address] [place], [place] [address]     RADIOLOGY STUDIES/RESULTS: Overnight EEG with video  Result Date: 11/25/2020 Charlsie Quest, MD     11/25/2020  9:10 AM Patient Name: Amanda Fritz MRN: [id number] Epilepsy Attending: Charlsie Quest Referring Physician/Provider: Jimmye Norman, NP Duration: 11/24/2020 1037 to 1640  Patient history: 64yo f with left-sided weakness/seizure-like activity. EEG to evaluate for seizure  Level of alertness: Awake  AEDs during EEG study: None  Technical aspects: This EEG study was done with scalp electrodes positioned according to the 10-20 International system of electrode placement. Electrical activity was acquired at a sampling rate of 500Hz  and reviewed with a high frequency filter of 70Hz  and a low frequency filter of 1Hz . EEG data were recorded continuously and digitally stored.  Description: The posterior dominant rhythm consists of 8-9 Hz activity of moderate voltage (25-35 uV) seen predominantly in posterior head regions, symmetric and reactive to eye opening and eye closing. Photic driving and  hyperventilation was not performed.    Of note, this study was technically difficult due to significant electrode and movement artifact.  IMPRESSION: This technically difficult study is within normal limits. No  seizures or epileptiform discharges were seen throughout the recording.  Priyanka     LOS: 4 days   , MD  [facility]    To contact the attending provider between 7A-7P or the covering provider during after hours 7P-7A, please log into the web site [email or web address] and access using universal [place] password for that web site. If you do not have the password, please call the hospital operator.  11/25/2020, 11:35 AM

## 2020-11-26 DIAGNOSIS — R531 Weakness: Secondary | ICD-10-CM | POA: Diagnosis not present

## 2020-11-26 DIAGNOSIS — I2583 Coronary atherosclerosis due to lipid rich plaque: Secondary | ICD-10-CM | POA: Diagnosis not present

## 2020-11-26 DIAGNOSIS — I2510 Atherosclerotic heart disease of native coronary artery without angina pectoris: Secondary | ICD-10-CM | POA: Diagnosis not present

## 2020-11-26 LAB — RESP PANEL BY RT-PCR (FLU A&B, COVID) ARPGX2
Influenza A by PCR: NEGATIVE
Influenza B by PCR: NEGATIVE
SARS Coronavirus 2 by RT PCR: NEGATIVE

## 2020-11-26 LAB — GLUCOSE, CAPILLARY
Glucose-Capillary: 116 mg/dL — ABNORMAL HIGH (ref 70–99)
Glucose-Capillary: 152 mg/dL — ABNORMAL HIGH (ref 70–99)
Glucose-Capillary: 166 mg/dL — ABNORMAL HIGH (ref 70–99)
Glucose-Capillary: 153 mg/dL — ABNORMAL HIGH (ref 70–99)

## 2020-11-26 MED ORDER — LEVETIRACETAM 250 MG PO TABS: 250.0000 mg | ORAL_TABLET | Freq: Two times a day (BID) | ORAL | Status: DC

## 2020-11-26 MED ORDER — INSULIN ASPART 100 UNIT/ML IJ SOLN: INTRAMUSCULAR | 11 refills | Status: DC

## 2020-11-26 NOTE — Plan of Care (Addendum)
Neurology Plan of Care  LTM initiated due to reported staring episodes lasting 2-3 minutes.  EEG overnight without evidence of seizures or epileptiform discharges.  Will discontinue LTM EEG monitoring. Based on her occasional, brief stereotypic events one of which was witnessed lasting ~3 minutes, we cannot completely exclude seizure and will continue low dose Keppra and have her follow up with outpatient neurology.   Recommendations/Plan:  -Discontinue LTM overnight with video.  -Keppra 250mg  po q12 hours (liquid or IV if not taking pos).  -May resume Aricept  -Continue seizure precautions.  -Follow up with outpatient neurology - Referral placed.  Discussed with attending MD, Dr. who is in agreement.  Thomasena Edis, AGACNP-BC [facility] [phone]

## 2020-11-26 NOTE — Progress Notes (Signed)
Attempted handoff report x 1 to receiving SNF, phone line is busy. Will try again.

## 2020-11-26 NOTE — Progress Notes (Signed)
Pt discharged waiting for [facility] for transport to [facility], this RN called report to Nurse Velna Hatchet in the facility at 1944, all questions and concern answered accordingly, will however continue to monitor until the arrival of transport. [name], Amanda Fritz Efe

## 2020-11-26 NOTE — Discharge Instructions (Signed)
Seizure precautions: °Per [place] DMV statutes, patients with seizures are not allowed to drive until they have been seizure-free for six months and cleared by a physician  °  °Use caution when using heavy equipment or power tools. Avoid working on ladders or at heights. Take showers instead of baths. Ensure the water temperature is not too high on the home water heater. Do not go swimming alone. Do not lock yourself in a room alone (i.e. bathroom). When caring for infants or small children, sit down when holding, feeding, or changing them to minimize risk of injury to the child in the event you have a seizure. Maintain good sleep hygiene. Avoid alcohol.  °  °If patient has another seizure, call 911 and bring them back to the ED if: °A.  The seizure lasts longer than 5 minutes.      °B.  The patient doesn't wake shortly after the seizure or has new problems such as difficulty seeing, speaking or moving following the seizure °C.  The patient was injured during the seizure °D.  The patient has a temperature over 102 F (39C) °E.  The patient vomited during the seizure and now is having trouble breathing °   °During the Seizure °  °- First, ensure adequate ventilation and place patients on the floor on their left side  °Loosen clothing around the neck and ensure the airway is patent. If the patient is clenching the teeth, do not force the mouth open with any object as this can cause severe damage °- Remove all items from the surrounding that can be hazardous. The patient may be oblivious to what's happening and may not even know what he or she is doing. °If the patient is confused and wandering, either gently guide him/her away and block access to outside areas °- Reassure the individual and be comforting °- Call 911. In most cases, the seizure ends before EMS arrives. However, there are cases when seizures may last over 3 to 5 minutes. Or the individual may have developed breathing difficulties or severe  injuries. If a pregnant patient or a person with diabetes develops a seizure, it is prudent to call an ambulance. °- Finally, if the patient does not regain full consciousness, then call EMS. Most patients will remain confused for about 45 to 90 minutes after a seizure, so you must use judgment in calling for help. °- Avoid restraints but make sure the patient is in a bed with padded side rails °- Place the individual in a lateral position with the neck slightly flexed; this will help the saliva drain from the mouth and prevent the tongue from falling backward °- Remove all nearby furniture and other hazards from the area °- Provide verbal assurance as the individual is regaining consciousness °- Provide the patient with privacy if possible °- Call for help and start treatment as ordered by the caregiver °  ° After the Seizure (Postictal Stage) °  °After a seizure, most patients experience confusion, fatigue, muscle pain and/or a headache. Thus, one should permit the individual to sleep. For the next few days, reassurance is essential. Being calm and helping reorient the person is also of importance. °  °Most seizures are painless and end spontaneously. Seizures are not harmful to others but can lead to complications such as stress on the lungs, brain and the heart. Individuals with prior lung problems may develop labored breathing and respiratory distress.  °  °

## 2020-11-26 NOTE — Plan of Care (Signed)
  Problem: Pain Managment: Goal: General experience of comfort will improve Outcome: Progressing   Problem: Safety: Goal: Ability to remain free from injury will improve Outcome: Progressing   Problem: Skin Integrity: Goal: Risk for impaired skin integrity will decrease Outcome: Progressing   

## 2020-11-26 NOTE — Progress Notes (Addendum)
Physical Therapy Treatment Patient Details Name: Amanda Fritz MRN: [id number] DOB: [birth year] Today's Date: 11/26/2020   History of Present Illness Pt is a 65 y.o. female admitted 9/10 for seizure-like activity and left-sided weakness. PMH significant of multiple medical issues including asthma, anxiety, type 2 diabetes, fibromyalgia, coronary artery disease, history of CVA, hypertension, neuropathy, history of subdural hematoma, and Parkinson's.    PT Comments    Pt received in supine, agreeable to therapy session and with good participation in bed mobility, transfer training and short gait task at bedside. Pt performed all mobility tasks with heavy cues, increased time and minA (and RW for gait). Pt motivated by plan to get up in chair and eat lunch, session time limited due to arrival of lunch tray and student RN arriving to assist pt to self-feed. Pt reclined in chair with alarm on and student RN present at end of session. Pt continues to benefit from PT services to progress toward functional mobility goals.    Recommendations for follow up therapy are one component of a multi-disciplinary discharge planning process, led by the attending physician.  Recommendations may be updated based on patient status, additional functional criteria and insurance authorization.  Follow Up Recommendations  SNF     Equipment Recommendations  None recommended by PT    Recommendations for Other Services       Precautions / Restrictions Precautions Precautions: Fall (seizure precautions) Precaution Comments: dementia Restrictions Weight Bearing Restrictions: No     Mobility  Bed Mobility Overal bed mobility: Needs Assistance Bed Mobility: Rolling;Sidelying to Sit Rolling: Supervision (max cues for rollimng) Sidelying to sit: Min assist       General bed mobility comments: minA to progress trunk to upright posture, max cues for sequencing, assist to scoot hips, use of R bed rail; to R EOB     Transfers Overall transfer level: Needs assistance Equipment used: Rolling walker (2 wheeled) Transfers: Sit to/from Stand Sit to Stand: Min assist         General transfer comment: from EOB>RW and RW>chair  Ambulation/Gait Ambulation/Gait assistance: Editor, commissioning (Feet): 20 Feet Assistive device: Rolling walker (2 wheeled) Gait Pattern/deviations: Step-through pattern;Decreased stride length;Decreased dorsiflexion - right;Decreased dorsiflexion - left;Drifts right/left;Narrow base of support   Gait velocity interpretation: <1.8 ft/sec, indicate of risk for recurrent falls General Gait Details: Pt demonstrates L drift/inattention that required max multimodal cuing to correct. Pt tending to hold RW too far advanced and has difficulty correcting despite cues; max cues for environmental awareness.   Stairs             Wheelchair Mobility    Modified Rankin (Stroke Patients Only) Modified Rankin (Stroke Patients Only) Pre-Morbid Rankin Score: Moderately severe disability Modified Rankin: Moderately severe disability     Balance Overall balance assessment: Needs assistance Sitting-balance support: Feet supported;No upper extremity supported Sitting balance-Leahy Scale: Fair Sitting balance - Comments: pt can be impulsive to lean/reach, min guard to Close Supervision for safety   Standing balance support: Bilateral upper extremity supported;During functional activity Standing balance-Leahy Scale: Poor Standing balance comment: Pt reliant on BUE on RW vs external assist                            Cognition Arousal/Alertness: Awake/alert Behavior During Therapy: Flat affect;Impulsive Overall Cognitive Status: Impaired/Different from baseline Area of Impairment: Safety/judgement;Awareness;Following commands;Memory;Orientation;Attention;Problem solving  Orientation Level: Disoriented to;Place;Situation Current Attention  Level: Focused Memory: Decreased short-term memory;Decreased recall of precautions Following Commands: Follows one step commands consistently;Follows one step commands with increased time Safety/Judgement: Decreased awareness of safety;Decreased awareness of deficits Awareness: Intellectual Problem Solving: Slow processing;Difficulty sequencing;Requires verbal cues;Requires tactile cues General Comments: pt alert and confused, not oriented to situation/month/year or location. Pt participatory as able with multimodal cues and increased time      Exercises Other Exercises Other Exercises: seated BLE AROM: ankle pumps, hip flexion, LAQ x10 reps ea Other Exercises: supine BLE AROM: heel slides, hip abduction x5 reps ea    General Comments General comments (skin integrity, edema, etc.): VSS per tele      Pertinent Vitals/Pain Pain Assessment: No/denies pain Faces Pain Scale: No hurt    Home Living                      Prior Function            PT Goals (current goals can now be found in the care plan section) Acute Rehab PT Goals Patient Stated Goal: unable to state PT Goal Formulation: With family Time For Goal Achievement: 12/06/20 Potential to Achieve Goals: Fair Progress towards PT goals: Progressing toward goals    Frequency    Min 3X/week      PT Plan Current plan remains appropriate    Co-evaluation              AM-PAC PT "6 Clicks" Mobility   Outcome Measure  Help needed turning from your back to your side while in a flat bed without using bedrails?: A Little Help needed moving from lying on your back to sitting on the side of a flat bed without using bedrails?: A Little Help needed moving to and from a bed to a chair (including a wheelchair)?: A Little Help needed standing up from a chair using your arms (e.g., wheelchair or bedside chair)?: A Little Help needed to walk in hospital room?: A Lot Help needed climbing 3-5 steps with a railing? :  Total 6 Click Score: 15    End of Session Equipment Utilized During Treatment: Gait belt Activity Tolerance: Patient tolerated treatment well Patient left: in chair;with call bell/phone within reach;with chair alarm set;with nursing/sitter in room Nurse Communication: Mobility status PT Visit Diagnosis: Other abnormalities of gait and mobility (R26.89);Muscle weakness (generalized) (M62.81);Other symptoms and signs involving the nervous system (R29.898)     Time: 3267-1245 PT Time Calculation (min) (ACUTE ONLY): 16 min  Charges:  $Therapeutic Activity: 8-22 mins                     Paris Hohn P., PTA Acute Rehabilitation Services Pager: [phone] Office: [phone]    Dorathy Kinsman Naturi Alarid 11/26/2020, 3:02 PM

## 2020-11-26 NOTE — TOC Transition Note (Signed)
Transition of Care [facility]) - CM/SW Discharge Note   Patient Details  Name: Amanda Fritz MRN: [id number] Date of Birth: Nov 18, 1955  [facility] CM/SW Contact:  Baldemar Lenis, LCSW Phone Number: 11/26/2020, 2:28 PM   Clinical Narrative:   Nurse to call report to [phone].    Final next level of care: Skilled Nursing Facility Barriers to Discharge: Barriers Resolved   Patient Goals and CMS Choice Patient states their goals for this hospitalization and ongoing recovery are:: Pt unable to participate in goal setting due to disorientation. CMS Medicare.gov Compare Post Acute Care list provided to:: Patient Represenative (must comment) Choice offered to / list presented to : Adult Children  Discharge Placement              Patient chooses bed at: [facility] Patient to be transferred to facility by: PTAR Name of family member notified: [name] Patient and family notified of of transfer: 11/26/20  Discharge Plan and Services     Post Acute Care Choice: Skilled Nursing Facility                               Social Determinants of Health (SDOH) Interventions     Readmission Risk Interventions No flowsheet data found.

## 2020-11-26 NOTE — Plan of Care (Signed)

## 2020-11-26 NOTE — Progress Notes (Signed)
Physical Therapy Treatment Patient Details Name: Amanda Fritz MRN: [id number] DOB: 09-Apr-1955 Today's Date: 11/26/2020   History of Present Illness Pt is a 65 y.o. female admitted 9/10 for seizure-like activity and left-sided weakness. PMH significant of multiple medical issues including asthma, anxiety, type 2 diabetes, fibromyalgia, coronary artery disease, history of CVA, hypertension, neuropathy, history of subdural hematoma, and Parkinson's.    PT Comments    Pt received attempting to slide legs out of recliner (alarm pad activated but not yet sounding), pt agreeable to further gait progression in room and then return to bed. Pt performed gait task with min/modA with and without RW, pt does better with no RW in narrow spaces of room due to decreased L awareness pt tending to bump RW on objects to L side. Pt able to look/attend to L side briefly but only with heavy cueing. Pt needing min/modA for transfers and gait progression. Pt continues to benefit from PT services to progress toward functional mobility goals.    Recommendations for follow up therapy are one component of a multi-disciplinary discharge planning process, led by the attending physician.  Recommendations may be updated based on patient status, additional functional criteria and insurance authorization.  Follow Up Recommendations  SNF     Equipment Recommendations  None recommended by PT    Recommendations for Other Services       Precautions / Restrictions Precautions Precautions: Fall (seizure precautions) Precaution Comments: dementia Restrictions Weight Bearing Restrictions: No     Mobility  Bed Mobility Overal bed mobility: Needs Assistance Bed Mobility: Sit to Sidelying;Rolling Rolling: Supervision (max cues for rollimng) Sidelying to sit: Min assist     Sit to sidelying: Min assist General bed mobility comments: minA for reverse log roll to supine, cues for straightening out in bed and minA for LE  assist.    Transfers Overall transfer level: Needs assistance Equipment used: Rolling walker (2 wheeled) Transfers: Sit to/from Stand Sit to Stand: Min assist         General transfer comment: chair>RW and to EOB, pt sits impulsively without reaching back prior to proper proximity to seated surface; may have fallen if therapist had not been present to guide her appropriately to surface/give cues for body awareness  Ambulation/Gait Ambulation/Gait assistance: Min assist;Mod assist Gait Distance (Feet): 50 Feet Assistive device: Rolling walker (2 wheeled);None Gait Pattern/deviations: Step-through pattern;Decreased stride length;Decreased dorsiflexion - right;Decreased dorsiflexion - left;Drifts right/left;Narrow base of support Gait velocity: decreased Gait velocity interpretation: <1.8 ft/sec, indicate of risk for recurrent falls General Gait Details: Pt demonstrates L drift/inattention that required max multimodal cuing to correct. Pt tending to hold RW too far advanced and has difficulty correcting despite cues; max cues for environmental awareness. After initial 33ft, final 72ft in room with no AD and min/modA; pt does better in narrow spaces of room without RW but needs more physical support for balance without AD      Modified Rankin (Stroke Patients Only) Modified Rankin (Stroke Patients Only) Pre-Morbid Rankin Score: Moderately severe disability Modified Rankin: Moderately severe disability     Balance Overall balance assessment: Needs assistance Sitting-balance support: Feet supported;No upper extremity supported Sitting balance-Leahy Scale: Fair Sitting balance - Comments: pt can be impulsive to lean/reach, min guard to Close Supervision for safety   Standing balance support: Bilateral upper extremity supported;During functional activity Standing balance-Leahy Scale: Poor Standing balance comment: Pt reliant on BUE on RW vs external assist  Cognition Arousal/Alertness: Awake/alert Behavior During Therapy: Flat affect;Impulsive Overall Cognitive Status: Impaired/Different from baseline Area of Impairment: Safety/judgement;Awareness;Following commands;Memory;Orientation;Attention;Problem solving                 Orientation Level: Disoriented to;Place;Situation Current Attention Level: Focused Memory: Decreased short-term memory;Decreased recall of precautions Following Commands: Follows one step commands consistently;Follows one step commands with increased time Safety/Judgement: Decreased awareness of safety;Decreased awareness of deficits Awareness: Intellectual Problem Solving: Slow processing;Difficulty sequencing;Requires verbal cues;Requires tactile cues General Comments: Pt attempting to slide legs off recliner as PTA entering room, agreeable to ambulation prior to return to bed. Pt participatory as able with multimodal cues and increased time      Exercises Other Exercises Other Exercises: seated BLE AROM: ankle pumps, hip flexion, LAQ x10 reps ea Other Exercises: supine BLE AROM: heel slides, hip abduction x5 reps ea    General Comments General comments (skin integrity, edema, etc.): VSS per tele      Pertinent Vitals/Pain Pain Assessment: No/denies pain Faces Pain Scale: No hurt     PT Goals (current goals can now be found in the care plan section) Acute Rehab PT Goals Patient Stated Goal: to walk more. PT Goal Formulation: With family Time For Goal Achievement: 12/06/20 Potential to Achieve Goals: Fair Progress towards PT goals: Progressing toward goals    Frequency    Min 3X/week      PT Plan Current plan remains appropriate       AM-PAC PT "6 Clicks" Mobility   Outcome Measure  Help needed turning from your back to your side while in a flat bed without using bedrails?: A Little Help needed moving from lying on your back to sitting on the side of a flat bed without using bedrails?:  A Little Help needed moving to and from a bed to a chair (including a wheelchair)?: A Little Help needed standing up from a chair using your arms (e.g., wheelchair or bedside chair)?: A Little Help needed to walk in hospital room?: A Lot Help needed climbing 3-5 steps with a railing? : Total 6 Click Score: 15    End of Session Equipment Utilized During Treatment: Gait belt Activity Tolerance: Patient tolerated treatment well Patient left: with call bell/phone within reach;in bed;with bed alarm set;with SCD's reapplied;Other (comment) (heels floated) Nurse Communication: Mobility status PT Visit Diagnosis: Other abnormalities of gait and mobility (R26.89);Muscle weakness (generalized) (M62.81);Other symptoms and signs involving the nervous system (R29.898)     Time: 5784-6962 PT Time Calculation (min) (ACUTE ONLY): 14 min  Charges:  $Gait Training: 8-22 mins                      Estephanie Hubbs P., PTA Acute Rehabilitation Services Pager: [phone] Office: [phone]    [place]us [place] 11/26/2020, 4:24 PM

## 2020-11-26 NOTE — Discharge Summary (Addendum)
PATIENT DETAILS Name: Amanda Fritz Age: 65 y.o. Sex: female Date of Birth: 1955/07/04 MRN: [id number]. Admitting Physician: Deneise Lever, MD PIR:JJOAC, Rosalyn Charters, MD  Admit Date: 11/21/2020 Discharge date: 11/27/2020  Recommendations for Outpatient Follow-up:  Follow up with PCP in 1-2 weeks Please obtain CMP/CBC in one week Please ensure outpatient follow-up with neurology.  Admitted From:  SNF  Disposition: SNF   Home Health: No  Equipment/Devices: None  Discharge Condition: Stable  CODE STATUS: FULL CODE  Diet recommendation:  Diet Order             Diet - low sodium heart healthy           DIET DYS 2 Room service appropriate? Yes with Assist; Fluid consistency: Thin  Diet effective now                    Brief Summary: Patient is a 65 y.o. female with history of parkinsonism, dementia, DM-2, HLD, HTN, CAD-presented with seizure disorder and left-sided weakness.  Brief Hospital Course: Seizure with left-sided Todd's paresis: Evaluated by neurology-no obvious EEG evidence of seizures-but given the fact that she had a witnessed seizure-like episode followed by left-sided transient weakness-recommendations are to continue Keppra on discharge.  Patient will need outpatient follow-up with neurology.   History of parkinsonism: Continue Sinemet   Dementia: Per neurology-okay to resume Aricept-this was briefly held.   DM-2 (A1c 6.5 on 9/11): CBG stable with just SSI-resume metformin on discharge.  Avoid tight glycemic control at this elderly frail patient.  HTN: BP stable-continue metoprolol/Lasix.   CAD: No anginal symptoms-continue aspirin/statin/beta-blocker   History of vitamin B12 deficiency: Continue supplementation   Mood disorder: Continue Zoloft/Remeron    Procedures None  Discharge Diagnoses:  Active Problems:   Left-sided weakness   Focal neurological deficit   Discharge Instructions:  Activity:  As tolerated with Full fall  precautions use walker/cane & assistance as needed  Discharge Instructions     Ambulatory referral to Neurology   Complete by: As directed    An appointment is requested in approximately: 2 weeks For paroxysms of seizure-like activity.   Diet - low sodium heart healthy   Complete by: As directed    Discharge instructions   Complete by: As directed    Follow with Primary MD  Bonnita Nasuti, MD in 1-2 weeks  Please follow-up with neurology in the next 2 to 4 weeks.  Check CBGs before meals and at bedtime.  Please get a complete blood count and chemistry panel checked by your Primary MD at your next visit, and again as instructed by your Primary MD.  Get Medicines reviewed and adjusted: Please take all your medications with you for your next visit with your Primary MD  Laboratory/radiological data: Please request your Primary MD to go over all hospital tests and procedure/radiological results at the follow up, please ask your Primary MD to get all Hospital records sent to his/her office.  In some cases, they will be blood work, cultures and biopsy results pending at the time of your discharge. Please request that your primary care M.D. follows up on these results.  Also Note the following: If you experience worsening of your admission symptoms, develop shortness of breath, life threatening emergency, suicidal or homicidal thoughts you must seek medical attention immediately by calling 911 or calling your MD immediately  if symptoms less severe.  You must read complete instructions/literature along with all the possible adverse reactions/side effects for all  the Medicines you take and that have been prescribed to you. Take any new Medicines after you have completely understood and accpet all the possible adverse reactions/side effects.   Do not drive when taking Pain medications or sleeping medications (Benzodaizepines)  Do not take more than prescribed Pain, Sleep and Anxiety  Medications. It is not advisable to combine anxiety,sleep and pain medications without talking with your primary care practitioner  Special Instructions: If you have smoked or chewed Tobacco  in the last 2 yrs please stop smoking, stop any regular Alcohol  and or any Recreational drug use.  Wear Seat belts while driving.  Please note: You were cared for by a hospitalist during your hospital stay. Once you are discharged, your primary care physician will handle any further medical issues. Please note that NO REFILLS for any discharge medications will be authorized once you are discharged, as it is imperative that you return to your primary care physician (or establish a relationship with a primary care physician if you do not have one) for your post hospital discharge needs so that they can reassess your need for medications and monitor your lab values.   Seizure precautions: Per [facility] statutes, patients with seizures are not allowed to drive until they have been seizure-free for six months and cleared by a physician    Use caution when using heavy equipment or power tools. Avoid working on ladders or at heights. Take showers instead of baths. Ensure the water temperature is not too high on the home water heater. Do not go swimming alone. Do not lock yourself in a room alone (i.e. bathroom). When caring for infants or small children, sit down when holding, feeding, or changing them to minimize risk of injury to the child in the event you have a seizure. Maintain good sleep hygiene. Avoid alcohol.    If patient has another seizure, call 911 and bring them back to the ED if: A.  The seizure lasts longer than 5 minutes.      B.  The patient doesn't wake shortly after the seizure or has new problems such as difficulty seeing, speaking or moving following the seizure C.  The patient was injured during the seizure D.  The patient has a temperature over 102 F (39C) E.  The patient vomited  during the seizure and now is having trouble breathing    During the Seizure   - First, ensure adequate ventilation and place patients on the floor on their left side  Loosen clothing around the neck and ensure the airway is patent. If the patient is clenching the teeth, do not force the mouth open with any object as this can cause severe damage - Remove all items from the surrounding that can be hazardous. The patient may be oblivious to what's happening and may not even know what he or she is doing. If the patient is confused and wandering, either gently guide him/her away and block access to outside areas - Reassure the individual and be comforting - Call 911. In most cases, the seizure ends before EMS arrives. However, there are cases when seizures may last over 3 to 5 minutes. Or the individual may have developed breathing difficulties or severe injuries. If a pregnant patient or a person with diabetes develops a seizure, it is prudent to call an ambulance. - Finally, if the patient does not regain full consciousness, then call EMS. Most patients will remain confused for about 45 to 90 minutes after a  seizure, so you must use judgment in calling for help. - Avoid restraints but make sure the patient is in a bed with padded side rails - Place the individual in a lateral position with the neck slightly flexed; this will help the saliva drain from the mouth and prevent the tongue from falling backward - Remove all nearby furniture and other hazards from the area - Provide verbal assurance as the individual is regaining consciousness - Provide the patient with privacy if possible - Call for help and start treatment as ordered by the caregiver    After the Seizure (Postictal Stage)   After a seizure, most patients experience confusion, fatigue, muscle pain and/or a headache. Thus, one should permit the individual to sleep. For the next few days, reassurance is essential. Being calm and helping  reorient the person is also of importance.   Most seizures are painless and end spontaneously. Seizures are not harmful to others but can lead to complications such as stress on the lungs, brain and the heart. Individuals with prior lung problems may develop labored breathing and respiratory distress.   Increase activity slowly   Complete by: As directed       Allergies as of 11/27/2020       Reactions   Ace Inhibitors Other (See Comments)   Bactrim [sulfamethoxazole-trimethoprim]    headache   Metformin    Neurontin [gabapentin]    Headache   Sulfamethoxazole    Trimethoprim    Aspirin Other (See Comments)   Ok to take [facility] ASA        Medication List     STOP taking these medications    HumuLIN 70/30 KwikPen (70-30) 100 UNIT/ML KwikPen Generic drug: insulin isophane & regular human KwikPen   insulin glargine 100 UNIT/ML injection Commonly known as: LANTUS       TAKE these medications    acetaminophen 500 MG tablet Commonly known as: TYLENOL Take 1,000 mg by mouth every 8 (eight) hours as needed for mild pain.   aspirin EC 81 MG tablet Take 81 mg by mouth every evening. Swallow whole.   atorvastatin 80 MG tablet Commonly known as: LIPITOR Take 80 mg by mouth daily at 6 PM.   carbidopa-levodopa 25-100 MG tablet Commonly known as: SINEMET IR Take 1.5 tablets by mouth 3 (three) times daily.   cyanocobalamin 1000 MCG/ML injection Commonly known as: (VITAMIN B-12) Inject 1,000 mcg into the muscle every 30 (thirty) days.   donepezil 10 MG tablet Commonly known as: ARICEPT Take 10 mg by mouth at bedtime.   furosemide 40 MG tablet Commonly known as: LASIX Take 40 mg by mouth 2 (two) times daily.   Glucagon Emergency 1 MG Kit Inject 1 mg as directed as needed (low bs and glucose below 60).   insulin aspart 100 UNIT/ML injection Commonly known as: novoLOG 0-9 Units, Subcutaneous, 3 times daily with meals, CBG < 70: Implement Hypoglycemia measures CBG 70 -  120: 0 units CBG 121 - 150: 1 unit CBG 151 - 200: 2 units CBG 201 - 250: 3 units CBG 251 - 300: 5 units CBG 301 - 350: 7 units CBG 351 - 400: 9 units CBG > 400: call MD and obtain STAT lab verification   levETIRAcetam 250 MG tablet Commonly known as: KEPPRA Take 1 tablet (250 mg total) by mouth every 12 (twelve) hours.   MELATONIN PO Take 6 mg by mouth at bedtime.   metFORMIN 500 MG tablet Commonly known as: GLUCOPHAGE Take 1,000  mg by mouth 2 (two) times daily.   metoprolol tartrate 50 MG tablet Commonly known as: LOPRESSOR Take 50 mg by mouth 2 (two) times daily.   mirtazapine 7.5 MG tablet Commonly known as: REMERON Take 7.5 mg by mouth at bedtime.   nitroGLYCERIN 0.4 MG SL tablet Commonly known as: NITROSTAT Place 0.4 mg under the tongue every 5 (five) minutes as needed for chest pain.   Potassium Chloride ER 20 MEQ Tbcr Take 2 tablets by mouth daily.   senna 8.6 MG tablet Commonly known as: SENOKOT Take 1 tablet by mouth every evening.   sertraline 100 MG tablet Commonly known as: ZOLOFT Take 100 mg by mouth in the morning.        Contact information for follow-up providers     [place], Rosalyn Charters, MD. Schedule an appointment as soon as possible for a visit in 1 week(s).   Specialty: Internal Medicine Contact information: [address] [place] [address] [phone]              Contact information for after-discharge care     [place] .   Service: Skilled Nursing Contact information: [address] [place] [phone]                    Allergies  Allergen Reactions   Ace Inhibitors Other (See Comments)   Bactrim [Sulfamethoxazole-Trimethoprim]     headache   Metformin    Neurontin [Gabapentin]     Headache    Sulfamethoxazole    Trimethoprim    Aspirin Other (See Comments)    Ok to take [facility] ASA      Consultations:   Neurology   Other Procedures/Studies: MR ANGIO HEAD WO CONTRAST  Result Date: 11/22/2020 CLINICAL DATA:  cva; Stroke, follow up EXAM: MRI HEAD WITHOUT CONTRAST MRA HEAD WITHOUT CONTRAST MRA NECK WITHOUT AND WITH CONTRAST TECHNIQUE: Multiplanar, multi-echo pulse sequences of the brain and surrounding structures were acquired without intravenous contrast. Angiographic images of the Circle of Willis were acquired using MRA technique without intravenous contrast. Angiographic images of the neck were acquired using MRA technique without and with intravenous contrast. Carotid stenosis measurements (when applicable) are obtained utilizing NASCET criteria, using the distal internal carotid diameter as the denominator. CONTRAST:  6.20mL GADAVIST GADOBUTROL 1 MMOL/ML IV SOLN COMPARISON:  CT 11/21/2020. FINDINGS: MRI HEAD FINDINGS Motion limited study.  Within this limitation: Brain: No acute infarction, hemorrhage, hydrocephalus, extra-axial collection or mass lesion. No pathologic intracranial enhancement. Moderate scattered T2 hyperintensities within the supraventricular and pontine white matter, nonspecific but most likely secondary to chronic microvascular ischemic disease given the patient's known risk factors (including diabetes and hypertension). 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 Small focus of susceptibility artifact within the left cerebellum and right thalamus, likely the sequela of prior microhemorrhage. Vascular: See below. Skull and upper cervical spine: Normal marrow signal. Sinuses/Orbits: Clear sinuses.  Unremarkable orbits. Other: Small bilateral mastoid effusions. MRA HEAD FINDINGS Moderate to severely motion degraded study. This precludes adequate evaluation for, and accurate quantification of, intracranial arterial stenoses. This also precludes adequateevaluation for intracranial aneurysms. Anterior circulation: Bilateral intracranial ICAs  are patent. Bilateral M1 MCAs are patent. Limited evaluation of the more distal MCA branches with some vascular flow related signal bilaterally. Bilateral A1 ACAs appear to be grossly patent. A2 ACA evaluation is essentially nondiagnostic due to extensive motion. Posterior circulation:  Bilateral intradural vertebral arteries, basilar artery and proximal posterior cerebral arteries are patent. MRA NECK FINDINGS Motion limited study. Aortic arch: Great vessel origins are patent. Right carotid system: Patent. Probable mild-to-moderate stenosis of the mid right ICA. Left carotid system: Patent.  No evidence of high-grade stenosis. Vertebral arteries: Patent.  No evidence of high-grade stenosis. IMPRESSION: MRI: 1. No evidence of acute intracranial abnormality on this motion limited exam. Specifically, no acute infarct. 2. 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 3. Prior microhemorrhages in the right thalamus and left cerebellum, potentially hypertensive given location and the patient's known history of hypertension. MRA Head: 1. No evidence of large vessel occlusion. 2. Moderate to severely motion degraded study. This precludes adequate evaluation for, and accurate quantification of, intracranial arterial stenoses. This also precludes adequateevaluation for intracranial aneurysms. A CTA may be able to better characterize if clinically indicated. MRA Neck: 1. Motion limited study with patent major arteries in the neck 2. Probable mild-to-moderate stenosis of the mid right cervical ICA. Electronically Signed   By: Margaretha Sheffield M.D.   On: 11/22/2020 12:34   MR ANGIO NECK W WO CONTRAST  Result Date: 11/22/2020 CLINICAL DATA:  cva; Stroke, follow up EXAM: MRI HEAD WITHOUT CONTRAST MRA HEAD WITHOUT CONTRAST MRA NECK WITHOUT AND WITH CONTRAST TECHNIQUE: Multiplanar, multi-echo pulse sequences of the brain and  surrounding structures were acquired without intravenous contrast. Angiographic images of the Circle of Willis were acquired using MRA technique without intravenous contrast. Angiographic images of the neck were acquired using MRA technique without and with intravenous contrast. Carotid stenosis measurements (when applicable) are obtained utilizing NASCET criteria, using the distal internal carotid diameter as the denominator. CONTRAST:  6.9mL GADAVIST GADOBUTROL 1 MMOL/ML IV SOLN COMPARISON:  CT 11/21/2020. FINDINGS: MRI HEAD FINDINGS Motion limited study.  Within this limitation: Brain: No acute infarction, hemorrhage, hydrocephalus, extra-axial collection or mass lesion. No pathologic intracranial enhancement. Moderate scattered T2 hyperintensities within the supraventricular and pontine white matter, nonspecific but most likely secondary to chronic microvascular ischemic disease given the patient's known risk factors (including diabetes and hypertension). 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 Small focus of susceptibility artifact within the left cerebellum and right thalamus, likely the sequela of prior microhemorrhage. Vascular: See below. Skull and upper cervical spine: Normal marrow signal. Sinuses/Orbits: Clear sinuses.  Unremarkable orbits. Other: Small bilateral mastoid effusions. MRA HEAD FINDINGS Moderate to severely motion degraded study. This precludes adequate evaluation for, and accurate quantification of, intracranial arterial stenoses. This also precludes adequateevaluation for intracranial aneurysms. Anterior circulation: Bilateral intracranial ICAs are patent. Bilateral M1 MCAs are patent. Limited evaluation of the more distal MCA branches with some vascular flow related signal bilaterally. Bilateral A1 ACAs appear to be grossly patent. A2 ACA evaluation is essentially nondiagnostic due to extensive motion. Posterior circulation:  Bilateral intradural vertebral arteries, basilar artery and proximal posterior cerebral arteries are patent. MRA NECK FINDINGS Motion limited study. Aortic arch: Great vessel origins are patent. Right carotid system: Patent. Probable mild-to-moderate stenosis of the mid right ICA. Left carotid system: Patent.  No evidence of high-grade stenosis. Vertebral arteries: Patent.  No evidence of high-grade stenosis. IMPRESSION: MRI: 1. No evidence of acute intracranial abnormality on this motion limited exam. Specifically, no acute infarct. 2. 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 3.  Prior microhemorrhages in the right thalamus and left cerebellum, potentially hypertensive given location and the patient's known history of hypertension. MRA Head: 1. No evidence of large vessel occlusion. 2. Moderate to severely motion degraded study. This precludes adequate evaluation for, and accurate quantification of, intracranial arterial stenoses. This also precludes adequateevaluation for intracranial aneurysms. A CTA may be able to better characterize if clinically indicated. MRA Neck: 1. Motion limited study with patent major arteries in the neck 2. Probable mild-to-moderate stenosis of the mid right cervical ICA. Electronically Signed   By: Feliberto Harts M.D.   On: 11/22/2020 12:34   MR BRAIN WO CONTRAST  Result Date: 11/22/2020 CLINICAL DATA:  cva; Stroke, follow up EXAM: MRI HEAD WITHOUT CONTRAST MRA HEAD WITHOUT CONTRAST MRA NECK WITHOUT AND WITH CONTRAST TECHNIQUE: Multiplanar, multi-echo pulse sequences of the brain and surrounding structures were acquired without intravenous contrast. Angiographic images of the Circle of Willis were acquired using MRA technique without intravenous contrast. Angiographic images of the neck were acquired using MRA technique without and with intravenous contrast. Carotid  stenosis measurements (when applicable) are obtained utilizing NASCET criteria, using the distal internal carotid diameter as the denominator. CONTRAST:  6.2mL GADAVIST GADOBUTROL 1 MMOL/ML IV SOLN COMPARISON:  CT 11/21/2020. FINDINGS: MRI HEAD FINDINGS Motion limited study.  Within this limitation: Brain: No acute infarction, hemorrhage, hydrocephalus, extra-axial collection or mass lesion. No pathologic intracranial enhancement. Moderate scattered T2 hyperintensities within the supraventricular and pontine white matter, nonspecific but most likely secondary to chronic microvascular ischemic disease given the patient's known risk factors (including diabetes and hypertension). 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 Small focus of susceptibility artifact within the left cerebellum and right thalamus, likely the sequela of prior microhemorrhage. Vascular: See below. Skull and upper cervical spine: Normal marrow signal. Sinuses/Orbits: Clear sinuses.  Unremarkable orbits. Other: Small bilateral mastoid effusions. MRA HEAD FINDINGS Moderate to severely motion degraded study. This precludes adequate evaluation for, and accurate quantification of, intracranial arterial stenoses. This also precludes adequateevaluation for intracranial aneurysms. Anterior circulation: Bilateral intracranial ICAs are patent. Bilateral M1 MCAs are patent. Limited evaluation of the more distal MCA branches with some vascular flow related signal bilaterally. Bilateral A1 ACAs appear to be grossly patent. A2 ACA evaluation is essentially nondiagnostic due to extensive motion. Posterior circulation: Bilateral intradural vertebral arteries, basilar artery and proximal posterior cerebral arteries are patent. MRA NECK FINDINGS Motion limited study. Aortic arch: Great vessel origins are patent. Right carotid system: Patent. Probable mild-to-moderate stenosis of the mid right ICA. Left carotid  system: Patent.  No evidence of high-grade stenosis. Vertebral arteries: Patent.  No evidence of high-grade stenosis. IMPRESSION: MRI: 1. No evidence of acute intracranial abnormality on this motion limited exam. Specifically, no acute infarct. 2. 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 3. Prior microhemorrhages in the right thalamus and left cerebellum, potentially hypertensive given location and the patient's known history of hypertension. MRA Head: 1. No evidence of large vessel occlusion. 2. Moderate to severely motion degraded study. This precludes adequate evaluation for, and accurate quantification of, intracranial arterial stenoses. This also precludes adequateevaluation for intracranial aneurysms. A CTA may be able to better characterize if clinically indicated. MRA Neck: 1. Motion limited study with patent major arteries in the neck 2. Probable mild-to-moderate stenosis of the mid right cervical ICA. Electronically Signed   By: Juluis Mire.D.  On: 11/22/2020 12:34   MR BRAIN W CONTRAST  Result Date: 11/23/2020 CLINICAL DATA:  Initial evaluation for seizure, abnormal neuro exam. EXAM: MRI HEAD WITH CONTRAST TECHNIQUE: Multiplanar, multiecho pulse sequences of the brain and surrounding structures were obtained with intravenous contrast. CONTRAST:  7.33mL GADAVIST GADOBUTROL 1 MMOL/ML IV SOLN COMPARISON:  Previous noncontrast MRIs from 11/22/2020. FINDINGS: Examination is degraded by motion artifact. No abnormal or pathologic enhancement seen within the brain following contrast administration. The remainder of the examination is otherwise unchanged. IMPRESSION: 1. Somewhat limited exam due to motion artifact. 2. No abnormal or pathologic enhancement within the brain following contrast administration. Electronically Signed   By: Jeannine Boga M.D.   On: 11/23/2020 03:26   DG Pelvis  Portable  Result Date: 11/22/2020 CLINICAL DATA:  Pre MRI screening. EXAM: PORTABLE PELVIS 1-2 VIEWS COMPARISON:  09/26/2020 FINDINGS: Central pelvic calcifications compatible with small degenerated fibroids. Lower lumbar spine degenerative changes. Atheromatous arterial calcifications. No metallic foreign bodies seen. IMPRESSION: No acute abnormality.  No metallic foreign bodies. Electronically Signed   By: Claudie Revering M.D.   On: 11/22/2020 09:41   DG CHEST PORT 1 VIEW  Result Date: 11/22/2020 CLINICAL DATA:  Pre MRI screening procedure. EXAM: PORTABLE CHEST 1 VIEW COMPARISON:  09/26/2020 FINDINGS: Poor inspiration. Normal sized heart. Clear lungs. Thoracic spine degenerative changes. No metallic foreign bodies. IMPRESSION: No acute abnormality.  No metallic foreign bodies. Electronically Signed   By: Claudie Revering M.D.   On: 11/22/2020 09:39   DG Abd Portable 1V  Result Date: 11/22/2020 CLINICAL DATA:  Pre MRI screening procedure. EXAM: PORTABLE ABDOMEN - 1 VIEW COMPARISON:  Portable pelvis dated 09/26/2020 FINDINGS: Normal bowel gas pattern. Central pelvic calcifications compatible with a small degenerated uterine fibroids. Lumbar and lower thoracic spine degenerative changes. No metallic foreign bodies are seen. IMPRESSION: No acute abnormality.  No metallic foreign bodies. Electronically Signed   By: Claudie Revering M.D.   On: 11/22/2020 09:36   EEG adult  Result Date: 11/22/2020 Lora Havens, MD     11/22/2020  5:13 PM Patient Name: ELEANOR GATLIFF MRN: [id number] Epilepsy Attending: Lora Havens Referring Physician/Provider: Dr Shanda Howells Date: 11/22/2020 Duration: 22.35 mins Patient history: 64yo f with left-sided weakness/seizure-like activity. EEG to evaluate for seizure Level of alertness: Awake AEDs during EEG study: None Technical aspects: This EEG study was done with scalp electrodes positioned according to the 10-20 International system of electrode placement. Electrical activity was  acquired at a sampling rate of $Remov'500Hz'mvWgRH$  and reviewed with a high frequency filter of $RemoveB'70Hz'xGbKFWoO$  and a low frequency filter of $RemoveB'1Hz'rOaxLvSj$ . EEG data were recorded continuously and digitally stored. Description: The posterior dominant rhythm consists of 8-9 Hz activity of moderate voltage (25-35 uV) seen predominantly in posterior head regions, symmetric and reactive to eye opening and eye closing. Physiologic photic driving was not seen during photic stimulation.  Hyperventilation was not performed.   Of note, this study was technically difficult due to significant movement and myogenic artifact. IMPRESSION: This technically difficult study is within normal limits. No seizures or epileptiform discharges were seen throughout the recording. If suspicion for interictal activity remains a concern, a repeat study can be considered. Priyanka Barbra Sarks   Overnight EEG with video  Result Date: 11/25/2020 Lora Havens, MD     11/25/2020  9:10 AM Patient Name: ROMELL CAVANAH MRN: [id number] Epilepsy Attending: Lora Havens Referring Physician/Provider: Clance Boll, NP Duration: 11/24/2020 1037 to 1640  Patient history: 65yo f  with left-sided weakness/seizure-like activity. EEG to evaluate for seizure  Level of alertness: Awake  AEDs during EEG study: None  Technical aspects: This EEG study was done with scalp electrodes positioned according to the 10-20 International system of electrode placement. Electrical activity was acquired at a sampling rate of $Remov'500Hz'SiHpcq$  and reviewed with a high frequency filter of $RemoveB'70Hz'YqMQlkii$  and a low frequency filter of $RemoveB'1Hz'OaCllVzi$ . EEG data were recorded continuously and digitally stored.  Description: The posterior dominant rhythm consists of 8-9 Hz activity of moderate voltage (25-35 uV) seen predominantly in posterior head regions, symmetric and reactive to eye opening and eye closing. Photic driving and  hyperventilation was not performed.    Of note, this study was technically difficult due to significant electrode  and movement artifact.  IMPRESSION: This technically difficult study is within normal limits. No seizures or epileptiform discharges were seen throughout the recording.  Lora Havens   ECHOCARDIOGRAM COMPLETE  Result Date: 11/22/2020    ECHOCARDIOGRAM REPORT   Patient Name:   ARNETHA SILVERTHORNE Date of Exam: 11/22/2020 Medical Rec #:  [id number]    Height:       63.0 in Accession #:    [id number]   Weight:       153.0 lb Date of Birth:  1955-09-08   BSA:          1.726 m Patient Age:    18 years     BP:           140/72 mmHg Patient Gender: F            HR:           62 bpm. Exam Location:  Inpatient Procedure: 2D Echo, Cardiac Doppler and Color Doppler Indications:    TIA G45.9  History:        Patient has no prior history of Echocardiogram examinations.                 Stroke; Risk Factors:Diabetes, Hypertension and ETOH.  Sonographer:    Bernadene Person RDCS Referring Phys: [place]  1. Left ventricular ejection fraction, by estimation, is 60 to 65%. The left ventricle has normal function. The left ventricle has no regional wall motion abnormalities. Left ventricular diastolic parameters are consistent with Grade I diastolic dysfunction (impaired relaxation).  2. Right ventricular systolic function is normal. The right ventricular size is normal. There is normal pulmonary artery systolic pressure.  3. The mitral valve is normal in structure. Trivial mitral valve regurgitation. No evidence of mitral stenosis.  4. The aortic valve is tricuspid. Aortic valve regurgitation is trivial. Mild aortic valve sclerosis is present, with no evidence of aortic valve stenosis.  5. The inferior vena cava is normal in size with greater than 50% respiratory variability, suggesting right atrial pressure of 3 mmHg. FINDINGS  Left Ventricle: Left ventricular ejection fraction, by estimation, is 60 to 65%. The left ventricle has normal function. The left ventricle has no regional wall motion abnormalities. The  left ventricular internal cavity size was normal in size. There is  no left ventricular hypertrophy. Left ventricular diastolic parameters are consistent with Grade I diastolic dysfunction (impaired relaxation). Right Ventricle: The right ventricular size is normal. Right ventricular systolic function is normal. There is normal pulmonary artery systolic pressure. The tricuspid regurgitant velocity is 2.23 m/s, and with an assumed right atrial pressure of 3 mmHg,  the estimated right ventricular systolic pressure is 30.1 mmHg. Left Atrium: Left atrial size was normal  in size. Right Atrium: Right atrial size was normal in size. Pericardium: There is no evidence of pericardial effusion. Mitral Valve: The mitral valve is normal in structure. Trivial mitral valve regurgitation. No evidence of mitral valve stenosis. Tricuspid Valve: The tricuspid valve is normal in structure. Tricuspid valve regurgitation is trivial. No evidence of tricuspid stenosis. Aortic Valve: The aortic valve is tricuspid. Aortic valve regurgitation is trivial. Aortic regurgitation PHT measures 635 msec. Mild aortic valve sclerosis is present, with no evidence of aortic valve stenosis. Pulmonic Valve: The pulmonic valve was normal in structure. Pulmonic valve regurgitation is not visualized. No evidence of pulmonic stenosis. Aorta: The aortic root is normal in size and structure. Venous: The inferior vena cava is normal in size with greater than 50% respiratory variability, suggesting right atrial pressure of 3 mmHg. IAS/Shunts: No atrial level shunt detected by color flow Doppler.  LEFT VENTRICLE PLAX 2D LVIDd:         3.90 cm  Diastology LVIDs:         2.20 cm  LV e' medial:    5.78 cm/s LV PW:         0.90 cm  LV E/e' medial:  14.9 LV IVS:        1.10 cm  LV e' lateral:   12.60 cm/s LVOT diam:     1.80 cm  LV E/e' lateral: 6.8 LV SV:         61 LV SV Index:   35 LVOT Area:     2.54 cm  RIGHT VENTRICLE RV S prime:     10.30 cm/s TAPSE (M-mode):  1.3 cm LEFT ATRIUM           Index       RIGHT ATRIUM          Index LA diam:      3.20 cm 1.85 cm/m  RA Area:     8.69 cm LA Vol (A2C): 29.5 ml 17.10 ml/m RA Volume:   13.00 ml 7.53 ml/m LA Vol (A4C): 33.5 ml 19.41 ml/m  AORTIC VALVE LVOT Vmax:   121.00 cm/s LVOT Vmean:  80.400 cm/s LVOT VTI:    0.239 m AI PHT:      635 msec  AORTA Ao Root diam: 2.70 cm Ao Asc diam:  3.30 cm MITRAL VALVE               TRICUSPID VALVE MV Area (PHT): 4.15 cm    TR Peak grad:   19.9 mmHg MV Decel Time: 183 msec    TR Vmax:        223.00 cm/s MV E velocity: 86.00 cm/s MV A velocity: 85.00 cm/s  SHUNTS MV E/A ratio:  1.01        Systemic VTI:  0.24 m                            Systemic Diam: 1.80 cm Kirk Ruths MD Electronically signed by Kirk Ruths MD Signature Date/Time: 11/22/2020/11:37:06 AM    Final    CT HEAD CODE STROKE WO CONTRAST  Result Date: 11/21/2020 CLINICAL DATA:  Code stroke.  Neuro deficit, acute, stroke suspected EXAM: CT HEAD WITHOUT CONTRAST TECHNIQUE: Contiguous axial images were obtained from the base of the skull through the vertex without intravenous contrast. COMPARISON:  CT head 09/26/2020. FINDINGS: Brain: No evidence of acute large vascular territory infarction, hemorrhage, hydrocephalus, extra-axial collection or mass lesion/mass effect. Similar patchy white matter hypoattenuation,  compatible with chronic microvascular ischemic disease. Probable remote lacunar infarcts in bilateral basal ganglia and right corona radiata, similar to priors Vascular: No hyperdense vessel identified. Skull: No acute fracture. Sinuses/Orbits: Clear sinuses.  Unremarkable orbits. Other: No mastoid effusions ASPECTS [facility] Stroke Program Early CT Score) total score (0-10 with 10 being normal): 10. IMPRESSION: 1. No evidence of acute large vascular territory infarct or acute hemorrhage. 2. ASPECTS is 10 3. 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 4. Partially empty sella, which is often a normal anatomic variant but can be associated with idiopathic intracranial hypertension. Findings discussed with Dr. Rocky Crafts via telephone at 3:20 p.m. Electronically Signed   By: Margaretha Sheffield M.D.   On: 11/21/2020 15:32     TODAY-DAY OF DISCHARGE:  Subjective:   Lorrine Kin today has no headache,no chest abdominal pain,no new weakness tingling or numbness, feels much better wants to go home today.   Objective:   Blood pressure (!) 147/76, pulse (!) 53, temperature 97.9 F (36.6 C), temperature source Oral, resp. rate 18, height $RemoveBe'5\' 3"'eUnnVQTmI$  (1.6 m), weight 69.4 kg, SpO2 100 %.  Intake/Output Summary (Last 24 hours) at 11/27/2020 0841 Last data filed at 11/26/2020 1739 Gross per 24 hour  Intake 490 ml  Output 650 ml  Net -160 ml   Filed Weights   11/21/20 1410  Weight: 69.4 kg    Exam: Awake Alert, Oriented *3, No new F.N deficits, Normal affect [place].AT,PERRAL Supple Neck,No JVD, No cervical lymphadenopathy appriciated.  Symmetrical Chest wall movement, Good air movement bilaterally, CTAB RRR,No Gallops,Rubs or new Murmurs, No Parasternal Heave +ve B.Sounds, Abd Soft, Non tender, No organomegaly appriciated, No rebound -guarding or rigidity. No Cyanosis, Clubbing or edema, No new Rash or bruise   PERTINENT RADIOLOGIC STUDIES: Overnight EEG with video  Result Date: 11/25/2020 Lora Havens, MD     11/25/2020  9:10 AM Patient Name: CHINAZA ROOKE MRN: [id number] Epilepsy Attending: Lora Havens Referring Physician/Provider: Clance Boll, NP Duration: 11/24/2020 1037 to 1640  Patient history: 65yo f with left-sided weakness/seizure-like activity. EEG to evaluate for seizure  Level of alertness: Awake  AEDs during EEG study: None  Technical aspects: This EEG study was done with scalp electrodes positioned according to the 10-20 International system of electrode placement. Electrical activity was  acquired at a sampling rate of $Remov'500Hz'HekdiC$  and reviewed with a high frequency filter of $RemoveB'70Hz'UGxgacXB$  and a low frequency filter of $RemoveB'1Hz'sHVnVhLt$ . EEG data were recorded continuously and digitally stored.  Description: The posterior dominant rhythm consists of 8-9 Hz activity of moderate voltage (25-35 uV) seen predominantly in posterior head regions, symmetric and reactive to eye opening and eye closing. Photic driving and  hyperventilation was not performed.    Of note, this study was technically difficult due to significant electrode and movement artifact.  IMPRESSION: This technically difficult study is within normal limits. No seizures or epileptiform discharges were seen throughout the recording.  Priyanka Barbra Sarks     PERTINENT LAB RESULTS: CBC: Recent Labs    11/24/20 0923  WBC 6.1  HGB 10.8*  HCT 33.6*  PLT 181   CMET CMP     Component Value Date/Time   NA 140 11/24/2020 0923   K 4.1 11/24/2020 0923   CL 108 11/24/2020 0923   CO2 25 11/24/2020 0923   GLUCOSE 173 (H) 11/24/2020 0923   BUN 6 (L) 11/24/2020 0923   CREATININE 0.65 11/24/2020 0370  CALCIUM 9.0 11/24/2020 0923   PROT 7.2 11/22/2020 0255   ALBUMIN 3.2 (L) 11/22/2020 0255   AST 23 11/22/2020 0255   ALT 15 11/22/2020 0255   ALKPHOS 97 11/22/2020 0255   BILITOT 0.5 11/22/2020 0255   GFRNONAA >60 11/24/2020 0923   GFRAA >60 07/11/2017 1113    GFR Estimated Creatinine Clearance: 64.6 mL/min (by C-G formula based on SCr of 0.65 mg/dL). No results for input(s): LIPASE, AMYLASE in the last 72 hours. No results for input(s): CKTOTAL, CKMB, CKMBINDEX, TROPONINI in the last 72 hours. Invalid input(s): POCBNP No results for input(s): DDIMER in the last 72 hours. No results for input(s): HGBA1C in the last 72 hours. No results for input(s): CHOL, HDL, LDLCALC, TRIG, CHOLHDL, LDLDIRECT in the last 72 hours. No results for input(s): TSH, T4TOTAL, T3FREE, THYROIDAB in the last 72 hours.  Invalid input(s): FREET3 No results for input(s):  VITAMINB12, FOLATE, FERRITIN, TIBC, IRON, RETICCTPCT in the last 72 hours. Coags: No results for input(s): INR in the last 72 hours.  Invalid input(s): PT Microbiology: Recent Results (from the past 240 hour(s))  Resp Panel by RT-PCR (Flu A&B, Covid) Nasopharyngeal Swab     Status: None   Collection Time: 11/21/20  5:02 PM   Specimen: Nasopharyngeal Swab; Nasopharyngeal(NP) swabs in vial transport medium  Result Value Ref Range Status   SARS Coronavirus 2 by RT PCR NEGATIVE NEGATIVE Final    Comment: (NOTE) SARS-CoV-2 target nucleic acids are NOT DETECTED.  The SARS-CoV-2 RNA is generally detectable in upper respiratory specimens during the acute phase of infection. The lowest concentration of SARS-CoV-2 viral copies this assay can detect is 138 copies/mL. A negative result does not preclude SARS-Cov-2 infection and should not be used as the sole basis for treatment or other patient management decisions. A negative result may occur with  improper specimen collection/handling, submission of specimen other than nasopharyngeal swab, presence of viral mutation(s) within the areas targeted by this assay, and inadequate number of viral copies(<138 copies/mL). A negative result must be combined with clinical observations, patient history, and epidemiological information. The expected result is Negative.  Fact Sheet for Patients:  EntrepreneurPulse.com.au  Fact Sheet for Healthcare Providers:  IncredibleEmployment.be  This test is no t yet approved or cleared by the [place] FDA and  has been authorized for detection and/or diagnosis of SARS-CoV-2 by FDA under an Emergency Use Authorization (EUA). This EUA will remain  in effect (meaning this test can be used) for the duration of the COVID-19 declaration under Section 564(b)(1) of the Act, 21 U.S.C.section 360bbb-3(b)(1), unless the authorization is terminated  or revoked sooner.        Influenza A by PCR NEGATIVE NEGATIVE Final   Influenza B by PCR NEGATIVE NEGATIVE Final    Comment: (NOTE) The Xpert Xpress SARS-CoV-2/FLU/RSV plus assay is intended as an aid in the diagnosis of influenza from Nasopharyngeal swab specimens and should not be used as a sole basis for treatment. Nasal washings and aspirates are unacceptable for Xpert Xpress SARS-CoV-2/FLU/RSV testing.  Fact Sheet for Patients: EntrepreneurPulse.com.au  Fact Sheet for Healthcare Providers: IncredibleEmployment.be  This test is not yet approved or cleared by the [place] FDA and has been authorized for detection and/or diagnosis of SARS-CoV-2 by FDA under an Emergency Use Authorization (EUA). This EUA will remain in effect (meaning this test can be used) for the duration of the COVID-19 declaration under Section 564(b)(1) of the Act, 21 U.S.C. section 360bbb-3(b)(1), unless the authorization is terminated or revoked.  Performed at [facility], [address], [place], [place] [address]   Resp Panel by RT-PCR (Flu A&B, Covid) Nasopharyngeal Swab     Status: None   Collection Time: 11/26/20  1:50 PM   Specimen: Nasopharyngeal Swab; Nasopharyngeal(NP) swabs in vial transport medium  Result Value Ref Range Status   SARS Coronavirus 2 by RT PCR NEGATIVE NEGATIVE Final    Comment: (NOTE) SARS-CoV-2 target nucleic acids are NOT DETECTED.  The SARS-CoV-2 RNA is generally detectable in upper respiratory specimens during the acute phase of infection. The lowest concentration of SARS-CoV-2 viral copies this assay can detect is 138 copies/mL. A negative result does not preclude SARS-Cov-2 infection and should not be used as the sole basis for treatment or other patient management decisions. A negative result may occur with  improper specimen collection/handling, submission of specimen other than nasopharyngeal swab, presence of viral mutation(s) within the areas  targeted by this assay, and inadequate number of viral copies(<138 copies/mL). A negative result must be combined with clinical observations, patient history, and epidemiological information. The expected result is Negative.  Fact Sheet for Patients:  EntrepreneurPulse.com.au  Fact Sheet for Healthcare Providers:  IncredibleEmployment.be  This test is no t yet approved or cleared by the [place] FDA and  has been authorized for detection and/or diagnosis of SARS-CoV-2 by FDA under an Emergency Use Authorization (EUA). This EUA will remain  in effect (meaning this test can be used) for the duration of the COVID-19 declaration under Section 564(b)(1) of the Act, 21 U.S.C.section 360bbb-3(b)(1), unless the authorization is terminated  or revoked sooner.       Influenza A by PCR NEGATIVE NEGATIVE Final   Influenza B by PCR NEGATIVE NEGATIVE Final    Comment: (NOTE) The Xpert Xpress SARS-CoV-2/FLU/RSV plus assay is intended as an aid in the diagnosis of influenza from Nasopharyngeal swab specimens and should not be used as a sole basis for treatment. Nasal washings and aspirates are unacceptable for Xpert Xpress SARS-CoV-2/FLU/RSV testing.  Fact Sheet for Patients: EntrepreneurPulse.com.au  Fact Sheet for Healthcare Providers: IncredibleEmployment.be  This test is not yet approved or cleared by the [place] FDA and has been authorized for detection and/or diagnosis of SARS-CoV-2 by FDA under an Emergency Use Authorization (EUA). This EUA will remain in effect (meaning this test can be used) for the duration of the COVID-19 declaration under Section 564(b)(1) of the Act, 21 U.S.C. section 360bbb-3(b)(1), unless the authorization is terminated or revoked.  Performed at [facility], [place] [address], [place], [place] [address]     FURTHER DISCHARGE INSTRUCTIONS:  Get Medicines reviewed and  adjusted: Please take all your medications with you for your next visit with your Primary MD  Laboratory/radiological data: Please request your Primary MD to go over all hospital tests and procedure/radiological results at the follow up, please ask your Primary MD to get all Hospital records sent to his/her office.  In some cases, they will be blood work, cultures and biopsy results pending at the time of your discharge. Please request that your primary care M.D. goes through all the records of your hospital data and follows up on these results.  Also Note the following: If you experience worsening of your admission symptoms, develop shortness of breath, life threatening emergency, suicidal or homicidal thoughts you must seek medical attention immediately by calling 911 or calling your MD immediately  if symptoms less severe.  You must read complete instructions/literature along with all the possible adverse reactions/side effects  for all the Medicines you take and that have been prescribed to you. Take any new Medicines after you have completely understood and accpet all the possible adverse reactions/side effects.   Do not drive when taking Pain medications or sleeping medications (Benzodaizepines)  Do not take more than prescribed Pain, Sleep and Anxiety Medications. It is not advisable to combine anxiety,sleep and pain medications without talking with your primary care practitioner  Special Instructions: If you have smoked or chewed Tobacco  in the last 2 yrs please stop smoking, stop any regular Alcohol  and or any Recreational drug use.  Wear Seat belts while driving.  Please note: You were cared for by a hospitalist during your hospital stay. Once you are discharged, your primary care physician will handle any further medical issues. Please note that NO REFILLS for any discharge medications will be authorized once you are discharged, as it is imperative that you return to your primary  care physician (or establish a relationship with a primary care physician if you do not have one) for your post hospital discharge needs so that they can reassess your need for medications and monitor your lab values.  Total Time spent coordinating discharge including counseling, education and face to face time equals 35 minutes.  SignedOren Binet 11/27/2020 8:41 AM

## 2020-11-27 DIAGNOSIS — R531 Weakness: Secondary | ICD-10-CM | POA: Diagnosis not present

## 2020-11-27 LAB — GLUCOSE, CAPILLARY
Glucose-Capillary: 119 mg/dL — ABNORMAL HIGH (ref 70–99)
Glucose-Capillary: 115 mg/dL — ABNORMAL HIGH (ref 70–99)

## 2020-11-27 NOTE — Progress Notes (Signed)
[facility] has been called to pick patient up ASAP to return to SNF.   Blenda Nicely, [place] Clinical Social Worker [phone]

## 2020-11-27 NOTE — Plan of Care (Signed)
11/26/20 Plan of care note:   "Recommendations/Plan:  -Discontinue LTM overnight with video.  -Keppra 250mg  po q12 hours (liquid or IV if not taking pos).  -May resume Aricept  -Continue seizure precautions.  -Follow up with outpatient neurology - Referral placed.15/22"  Informed that daughter wanted update. Dr. 03-08-1975 spoke to daughter via phone and explained neurological plan with Keppra. All questions were asked and answered.   Patient was to be discharged yesterday, but there were transportation issues. Patient should go to SNF today.   No further neurology evaluation needed. Will be available for questions   Thomasena Edis, NP/H. Jimmye Norman, MD.

## 2020-11-27 NOTE — Progress Notes (Signed)
Per [facility] transportation, they called facility and faciilty declined to accept the patient at this hour. Facility stated it was too late to receive patient. Facility asked staff to call back at 8:30 in the morning and they will accept the transfer at that time. MD notified.

## 2020-11-27 NOTE — Progress Notes (Signed)
Blood pressure (!) 147/76, pulse (!) 53, temperature 97.9 F (36.6 C), temperature source Oral, resp. rate 18, height 5\' 3"  (1.6 m), weight 69.4 kg, SpO2 100 %.   Awakes-remains pleasantly confused Moving all 4 extremities  Discharged 9/15-but due to logistic/transport issues-did not leave-stable to be discharged today. See d/c summary for further details

## 2020-11-27 NOTE — Progress Notes (Signed)
Occupational Therapy Treatment Patient Details Name: Amanda Fritz MRN: [id number] DOB: [birth year] Today's Date: 11/27/2020   History of present illness Pt is a 65 y.o. female admitted 9/10 for seizure-like activity and left-sided weakness. PMH significant of multiple medical issues including asthma, anxiety, type 2 diabetes, fibromyalgia, coronary artery disease, history of CVA, hypertension, neuropathy, history of subdural hematoma, and Parkinson's.   OT comments  Pt agreeable to OT session. Pt with flat affect throughout session and minimally conversant. Pt was cooperative and calm. She requires modA at [facility] level for toilet transfer, navigating around the room and setupA for eating. Pt noted to bump into furniture on R and L side of environment. Poor safety awareness and decreased problem solving skills. Pt will continue to benefit from skilled OT services to maximize safety and independence with ADL/IADL and functional mobility. Will continue to follow acutely and progress as tolerated.     Recommendations for follow up therapy are one component of a multi-disciplinary discharge planning process, led by the attending physician.  Recommendations may be updated based on patient status, additional functional criteria and insurance authorization.    Follow Up Recommendations  SNF;Supervision/Assistance - 24 hour    Equipment Recommendations   (defer to post acute setting.)    Recommendations for Other Services      Precautions / Restrictions Precautions Precautions: Fall (seizure precautions) Precaution Comments: dementia Restrictions Weight Bearing Restrictions: No       Mobility Bed Mobility Overal bed mobility: Needs Assistance Bed Mobility: Sit to Sidelying;Rolling Rolling: Supervision Sidelying to sit: Min assist       General bed mobility comments: minA to progress trunk to upright position    Transfers Overall transfer level: Needs assistance Equipment used: Rolling  walker (2 wheeled) Transfers: Sit to/from Stand Sit to Stand: Mod assist         General transfer comment: modA to powerup from low surface, assist for control sitting    Balance Overall balance assessment: Needs assistance Sitting-balance support: Feet supported;No upper extremity supported Sitting balance-Leahy Scale: Fair Sitting balance - Comments: pt can be impulsive to lean/reach, min guard to Close Supervision for safety   Standing balance support: Bilateral upper extremity supported;During functional activity Standing balance-Leahy Scale: Poor Standing balance comment: Pt reliant on BUE on RW vs external assist                           ADL either performed or assessed with clinical judgement   ADL Overall ADL's : Needs assistance/impaired Eating/Feeding: Set up;Sitting Eating/Feeding Details (indicate cue type and reason): intermittent supervision per sLP Grooming: Minimal assistance Grooming Details (indicate cue type and reason): minA for sequencing                 Toilet Transfer: Moderate assistance Toilet Transfer Details (indicate cue type and reason): modA to navigate around room and assistance for RW management Toileting- Clothing Manipulation and Hygiene: Maximal assistance Toileting - Clothing Manipulation Details (indicate cue type and reason): maxA for pericare in standing     Functional mobility during ADLs: Moderate assistance;Rolling walker General ADL Comments: modA for sequencing, navigating in room as pt bumping into furniture on both sides, pt head turned toward left during session     Vision   Vision Assessment?: Vision impaired- to be further tested in functional context   Perception     Praxis      Cognition Arousal/Alertness: Awake/alert Behavior During Therapy: Flat affect;Impulsive Overall Cognitive  Status: Impaired/Different from baseline Area of Impairment: Safety/judgement;Awareness;Following  commands;Memory;Orientation;Attention;Problem solving                 Orientation Level: Disoriented to;Place;Situation Current Attention Level: Focused Memory: Decreased short-term memory;Decreased recall of precautions Following Commands: Follows one step commands consistently;Follows one step commands with increased time Safety/Judgement: Decreased awareness of safety;Decreased awareness of deficits Awareness: Intellectual Problem Solving: Slow processing;Difficulty sequencing;Requires verbal cues;Requires tactile cues General Comments: flat affect throughout session;pt required moderate assistance to navigate in room to transfer to bathroom and to recliner;pt with one word answers this session; Pt calm and cooperative        Exercises     Shoulder Instructions       General Comments VSS    Pertinent Vitals/ Pain       Pain Assessment: No/denies pain Faces Pain Scale: No hurt  Home Living                                          Prior Functioning/Environment              Frequency  Min 2X/week        Progress Toward Goals  OT Goals(current goals can now be found in the care plan section)  Progress towards OT goals: Progressing toward goals  Acute Rehab OT Goals Patient Stated Goal: to walk more. OT Goal Formulation: Patient unable to participate in goal setting Time For Goal Achievement: 12/06/20 Potential to Achieve Goals: Fair ADL Goals Pt Will Perform Grooming: with set-up;sitting Pt Will Perform Upper Body Dressing: with supervision;sitting Pt Will Perform Lower Body Dressing: with supervision;sit to/from stand;with adaptive equipment Pt Will Transfer to Toilet: with supervision;stand pivot transfer;bedside commode Pt Will Perform Toileting - Clothing Manipulation and hygiene: with supervision;sit to/from stand;with adaptive equipment  Plan Discharge plan remains appropriate    Co-evaluation                 AM-PAC OT  "6 Clicks" Daily Activity     Outcome Measure   Help from another person eating meals?: A Little Help from another person taking care of personal grooming?: A Little Help from another person toileting, which includes using toliet, bedpan, or urinal?: A Lot Help from another person bathing (including washing, rinsing, drying)?: A Lot Help from another person to put on and taking off regular upper body clothing?: A Little Help from another person to put on and taking off regular lower body clothing?: A Lot 6 Click Score: 15    End of Session Equipment Utilized During Treatment: Rolling walker;Gait belt  OT Visit Diagnosis: Unsteadiness on feet (R26.81);Muscle weakness (generalized) (M62.81)   Activity Tolerance Patient limited by lethargy   Patient Left with call bell/phone within reach;in chair;with chair alarm set   Nurse Communication Mobility status        Time: 5397-6734 OT Time Calculation (min): 14 min  Charges: OT General Charges $OT Visit: 1 Visit OT Treatments $Self Care/Home Management : 8-22 mins  Rosey Bath OTR/L Acute Rehabilitation Services Office: [phone]   Rebeca Alert 11/27/2020, 10:37 AM

## 2020-12-08 ENCOUNTER — Inpatient Hospital Stay (HOSPITAL_COMMUNITY)
Admission: EM | Admit: 2020-12-08 | Discharge: 2020-12-14 | DRG: 640 | Disposition: A | Discharge status: Skilled Nursing Facility | Payer: Medicare Other | Source: Skilled Nursing Facility | Attending: Internal Medicine | Admitting: Internal Medicine

## 2020-12-08 ENCOUNTER — Emergency Department (HOSPITAL_COMMUNITY): Payer: Medicare Other

## 2020-12-08 ENCOUNTER — Inpatient Hospital Stay (HOSPITAL_COMMUNITY): Payer: Medicare Other

## 2020-12-08 ENCOUNTER — Other Ambulatory Visit: Payer: Self-pay

## 2020-12-08 ENCOUNTER — Encounter (HOSPITAL_COMMUNITY): Payer: Self-pay | Admitting: Emergency Medicine

## 2020-12-08 DIAGNOSIS — E872 Acidosis, unspecified: Secondary | ICD-10-CM | POA: Diagnosis present

## 2020-12-08 DIAGNOSIS — E875 Hyperkalemia: Secondary | ICD-10-CM | POA: Diagnosis not present

## 2020-12-08 DIAGNOSIS — F0283 Dementia in other diseases classified elsewhere, unspecified severity, with mood disturbance: Secondary | ICD-10-CM | POA: Diagnosis present

## 2020-12-08 DIAGNOSIS — T426X5A Adverse effect of other antiepileptic and sedative-hypnotic drugs, initial encounter: Secondary | ICD-10-CM | POA: Diagnosis present

## 2020-12-08 DIAGNOSIS — E1159 Type 2 diabetes mellitus with other circulatory complications: Secondary | ICD-10-CM | POA: Diagnosis not present

## 2020-12-08 DIAGNOSIS — F419 Anxiety disorder, unspecified: Secondary | ICD-10-CM | POA: Diagnosis present

## 2020-12-08 DIAGNOSIS — Z888 Allergy status to other drugs, medicaments and biological substances status: Secondary | ICD-10-CM

## 2020-12-08 DIAGNOSIS — F32A Depression, unspecified: Secondary | ICD-10-CM | POA: Diagnosis present

## 2020-12-08 DIAGNOSIS — Z7984 Long term (current) use of oral hypoglycemic drugs: Secondary | ICD-10-CM

## 2020-12-08 DIAGNOSIS — R1310 Dysphagia, unspecified: Secondary | ICD-10-CM | POA: Diagnosis present

## 2020-12-08 DIAGNOSIS — E870 Hyperosmolality and hypernatremia: Secondary | ICD-10-CM

## 2020-12-08 DIAGNOSIS — Z8673 Personal history of transient ischemic attack (TIA), and cerebral infarction without residual deficits: Secondary | ICD-10-CM | POA: Diagnosis not present

## 2020-12-08 DIAGNOSIS — I2510 Atherosclerotic heart disease of native coronary artery without angina pectoris: Secondary | ICD-10-CM | POA: Diagnosis present

## 2020-12-08 DIAGNOSIS — Z882 Allergy status to sulfonamides status: Secondary | ICD-10-CM | POA: Diagnosis not present

## 2020-12-08 DIAGNOSIS — G20 Parkinson's disease: Secondary | ICD-10-CM | POA: Diagnosis present

## 2020-12-08 DIAGNOSIS — E860 Dehydration: Principal | ICD-10-CM | POA: Diagnosis present

## 2020-12-08 DIAGNOSIS — R7989 Other specified abnormal findings of blood chemistry: Secondary | ICD-10-CM | POA: Diagnosis present

## 2020-12-08 DIAGNOSIS — N179 Acute kidney failure, unspecified: Secondary | ICD-10-CM | POA: Diagnosis present

## 2020-12-08 DIAGNOSIS — I10 Essential (primary) hypertension: Secondary | ICD-10-CM | POA: Diagnosis present

## 2020-12-08 DIAGNOSIS — G9341 Metabolic encephalopathy: Secondary | ICD-10-CM | POA: Diagnosis present

## 2020-12-08 DIAGNOSIS — R392 Extrarenal uremia: Secondary | ICD-10-CM | POA: Diagnosis present

## 2020-12-08 DIAGNOSIS — Z20822 Contact with and (suspected) exposure to covid-19: Secondary | ICD-10-CM | POA: Diagnosis present

## 2020-12-08 DIAGNOSIS — R400 Somnolence: Secondary | ICD-10-CM

## 2020-12-08 DIAGNOSIS — E1140 Type 2 diabetes mellitus with diabetic neuropathy, unspecified: Secondary | ICD-10-CM | POA: Diagnosis present

## 2020-12-08 DIAGNOSIS — E876 Hypokalemia: Secondary | ICD-10-CM | POA: Diagnosis not present

## 2020-12-08 HISTORY — DX: Unspecified dementia, unspecified severity, without behavioral disturbance, psychotic disturbance, mood disturbance, and anxiety: F03.90

## 2020-12-08 HISTORY — DX: Parkinson's disease: G20

## 2020-12-08 HISTORY — DX: Dysphagia, unspecified: R13.10

## 2020-12-08 LAB — TROPONIN I (HIGH SENSITIVITY)
Troponin I (High Sensitivity): 33 ng/L — ABNORMAL HIGH (ref <18)
Troponin I (High Sensitivity): 38 ng/L — ABNORMAL HIGH (ref <18)

## 2020-12-08 LAB — CBC WITH DIFFERENTIAL/PLATELET
Abs Immature Granulocytes: 0.12 10*3/uL — ABNORMAL HIGH (ref 0.00–0.07)
Basophils Absolute: 0.1 10*3/uL (ref 0.0–0.1)
Basophils Relative: 0 %
Eosinophils Absolute: 0.9 10*3/uL — ABNORMAL HIGH (ref 0.0–0.5)
Eosinophils Relative: 5 %
HCT: 45.9 % (ref 36.0–46.0)
Hemoglobin: 13.6 g/dL (ref 12.0–15.0)
Immature Granulocytes: 1 %
Lymphocytes Relative: 15 %
Lymphs Abs: 2.6 10*3/uL (ref 0.7–4.0)
MCH: 24.7 pg — ABNORMAL LOW (ref 26.0–34.0)
MCHC: 29.6 g/dL — ABNORMAL LOW (ref 30.0–36.0)
MCV: 83.3 fL (ref 80.0–100.0)
Monocytes Absolute: 1 10*3/uL (ref 0.1–1.0)
Monocytes Relative: 6 %
Neutro Abs: 12.5 10*3/uL — ABNORMAL HIGH (ref 1.7–7.7)
Neutrophils Relative %: 73 %
Platelets: 272 10*3/uL (ref 150–400)
RBC: 5.51 MIL/uL — ABNORMAL HIGH (ref 3.87–5.11)
RDW: 16.4 % — ABNORMAL HIGH (ref 11.5–15.5)
WBC: 17.2 10*3/uL — ABNORMAL HIGH (ref 4.0–10.5)
nRBC: 0 % (ref 0.0–0.2)

## 2020-12-08 LAB — COMPREHENSIVE METABOLIC PANEL
ALT: 9 U/L (ref 0–44)
AST: 27 U/L (ref 15–41)
Albumin: 4.1 g/dL (ref 3.5–5.0)
Alkaline Phosphatase: 107 U/L (ref 38–126)
Anion gap: 20 — ABNORMAL HIGH (ref 5–15)
BUN: 135 mg/dL — ABNORMAL HIGH (ref 8–23)
CO2: 14 mmol/L — ABNORMAL LOW (ref 22–32)
Calcium: 10.1 mg/dL (ref 8.9–10.3)
Chloride: 120 mmol/L — ABNORMAL HIGH (ref 98–111)
Creatinine, Ser: 4.07 mg/dL — ABNORMAL HIGH (ref 0.44–1.00)
GFR, Estimated: 12 mL/min — ABNORMAL LOW (ref >60)
Glucose, Bld: 177 mg/dL — ABNORMAL HIGH (ref 70–99)
Potassium: 6.1 mmol/L — ABNORMAL HIGH (ref 3.5–5.1)
Sodium: 154 mmol/L — ABNORMAL HIGH (ref 135–145)
Total Bilirubin: 0.6 mg/dL (ref 0.3–1.2)
Total Protein: 9 g/dL — ABNORMAL HIGH (ref 6.5–8.1)

## 2020-12-08 LAB — BASIC METABOLIC PANEL
Anion gap: 14 (ref 5–15)
BUN: 128 mg/dL — ABNORMAL HIGH (ref 8–23)
CO2: 20 mmol/L — ABNORMAL LOW (ref 22–32)
Calcium: 10.1 mg/dL (ref 8.9–10.3)
Chloride: 121 mmol/L — ABNORMAL HIGH (ref 98–111)
Creatinine, Ser: 3.54 mg/dL — ABNORMAL HIGH (ref 0.44–1.00)
GFR, Estimated: 14 mL/min — ABNORMAL LOW (ref >60)
Glucose, Bld: 252 mg/dL — ABNORMAL HIGH (ref 70–99)
Potassium: 4.1 mmol/L (ref 3.5–5.1)
Sodium: 155 mmol/L — ABNORMAL HIGH (ref 135–145)

## 2020-12-08 LAB — URINALYSIS, ROUTINE W REFLEX MICROSCOPIC
Bilirubin Urine: NEGATIVE
Glucose, UA: NEGATIVE mg/dL
Hgb urine dipstick: NEGATIVE
Ketones, ur: 5 mg/dL — AB
Leukocytes,Ua: NEGATIVE
Nitrite: NEGATIVE
Protein, ur: 30 mg/dL — AB
Specific Gravity, Urine: 1.024 (ref 1.005–1.030)
pH: 5 (ref 5.0–8.0)

## 2020-12-08 LAB — CBG MONITORING, ED
Glucose-Capillary: 196 mg/dL — ABNORMAL HIGH (ref 70–99)
Glucose-Capillary: 121 mg/dL — ABNORMAL HIGH (ref 70–99)
Glucose-Capillary: 202 mg/dL — ABNORMAL HIGH (ref 70–99)

## 2020-12-08 LAB — TSH: TSH: 0.518 u[IU]/mL (ref 0.350–4.500)

## 2020-12-08 LAB — GLUCOSE, CAPILLARY: Glucose-Capillary: 207 mg/dL — ABNORMAL HIGH (ref 70–99)

## 2020-12-08 LAB — AMMONIA: Ammonia: 18 umol/L (ref 9–35)

## 2020-12-08 LAB — RESP PANEL BY RT-PCR (FLU A&B, COVID) ARPGX2
Influenza A by PCR: NEGATIVE
Influenza B by PCR: NEGATIVE
SARS Coronavirus 2 by RT PCR: NEGATIVE

## 2020-12-08 LAB — LACTIC ACID, PLASMA
Lactic Acid, Venous: 4.5 mmol/L (ref 0.5–1.9)
Lactic Acid, Venous: 3.4 mmol/L (ref 0.5–1.9)

## 2020-12-08 LAB — MRSA NEXT GEN BY PCR, NASAL: MRSA by PCR Next Gen: DETECTED — AB

## 2020-12-08 LAB — LIPASE, BLOOD: Lipase: 114 U/L — ABNORMAL HIGH (ref 11–51)

## 2020-12-08 IMAGING — US US RENAL
1 series · 14 of 25 positions shown · non-contrast
Comparison: None.

CLINICAL DATA: Acute renal injury.

EXAM:
RENAL / URINARY TRACT ULTRASOUND COMPLETE

[Series 1: us renal · 14 of 48 slices shown]
[im 1/48]
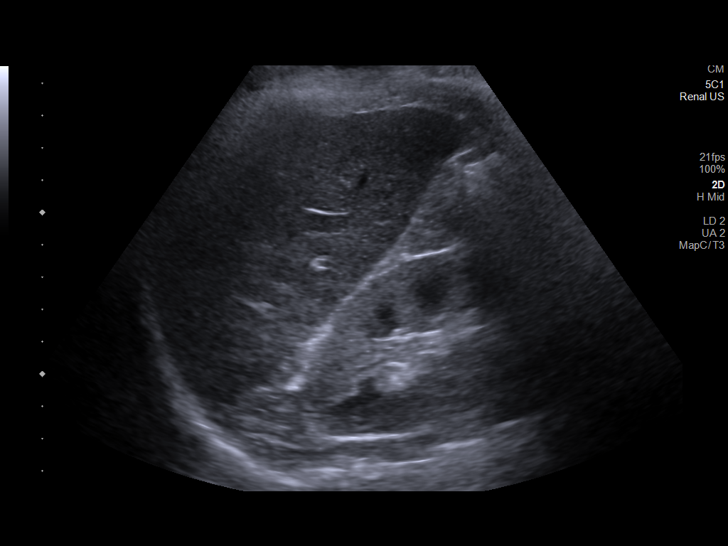
[im 4/48]
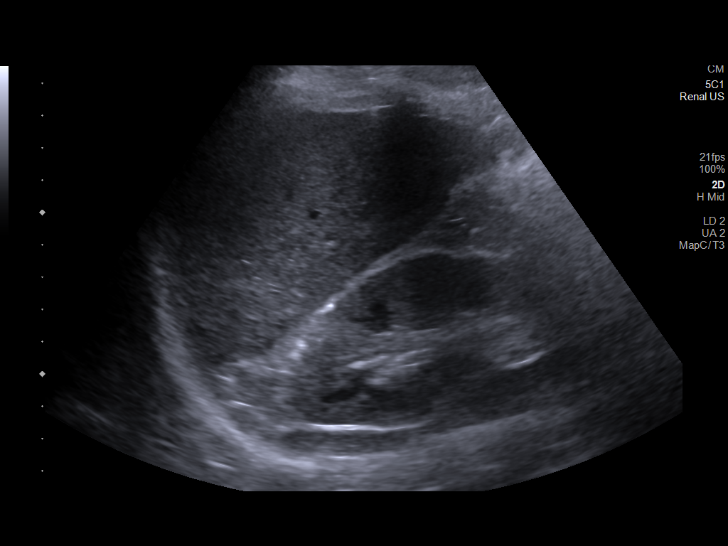
[im 8/48]
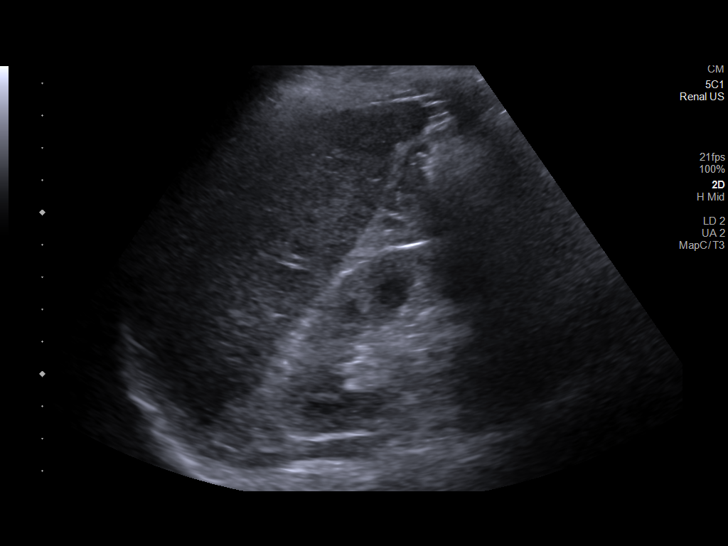
[im 12/48]
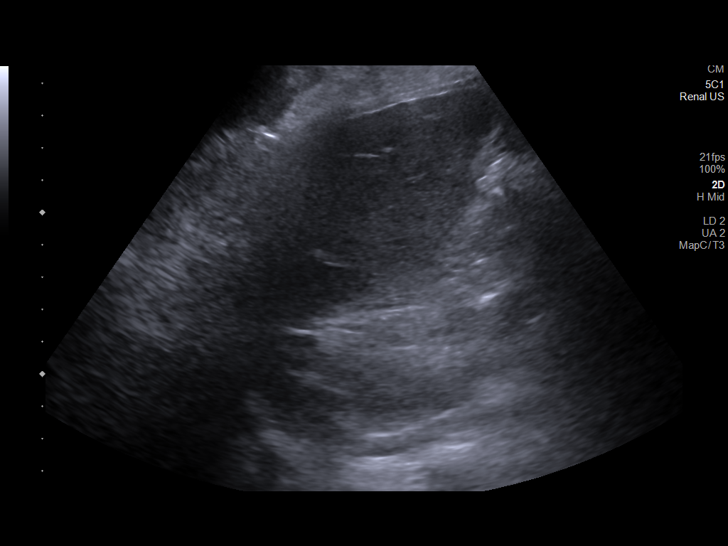
[im 16/48]
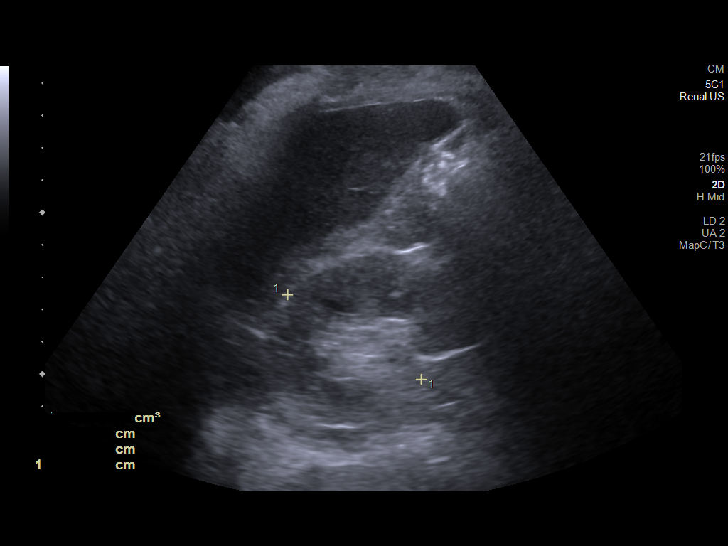
[im 18/48]
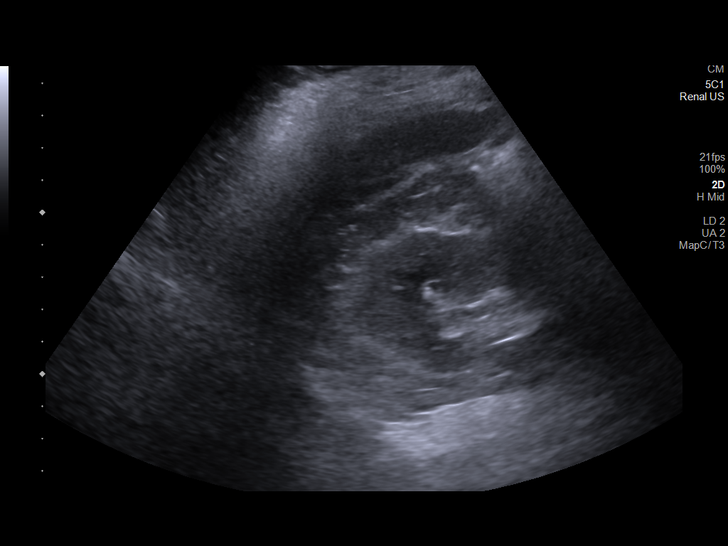
[im 22/48]
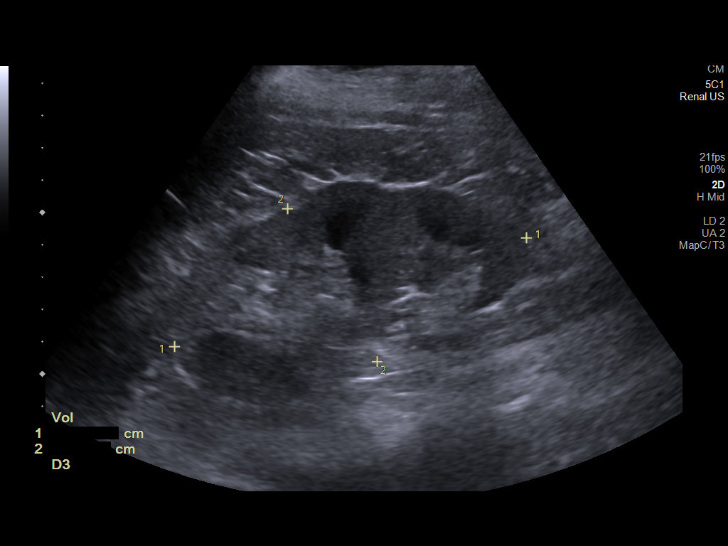
[im 26/48]
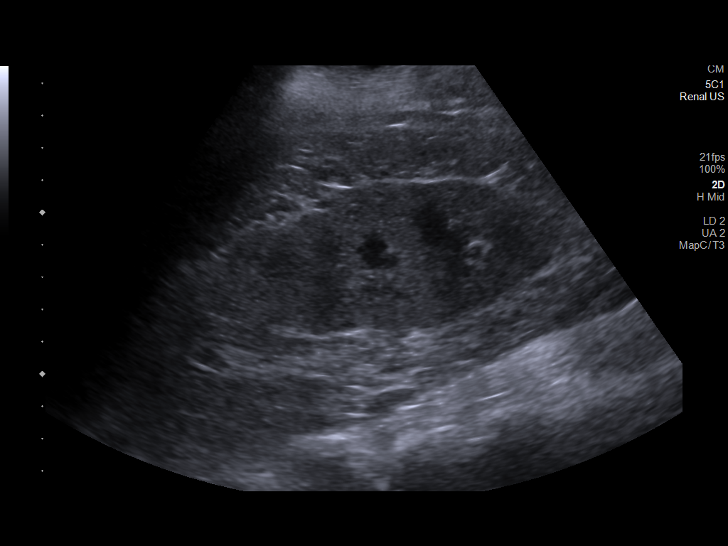
[im 30/48]
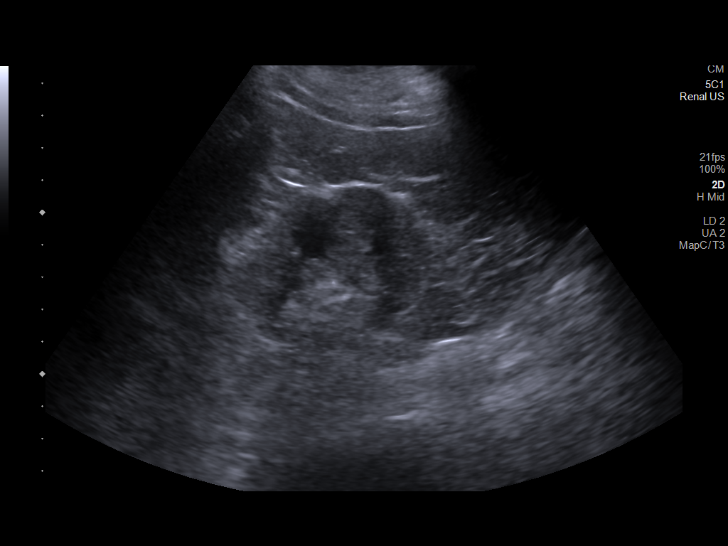
[im 32/48]
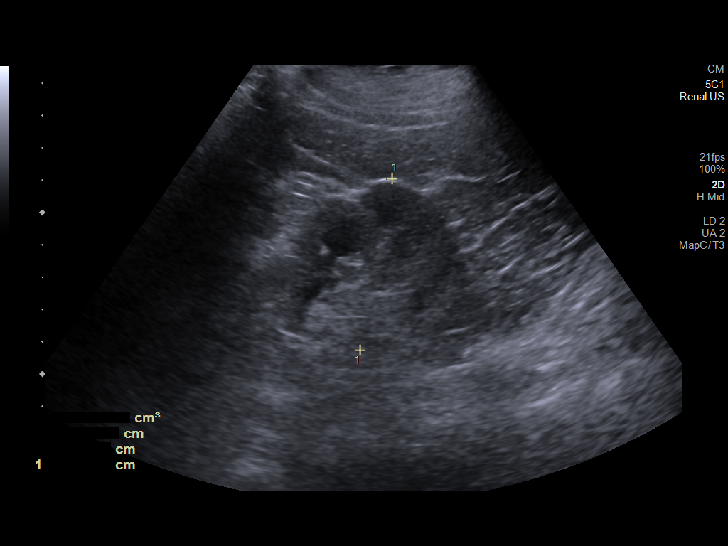
[im 36/48]
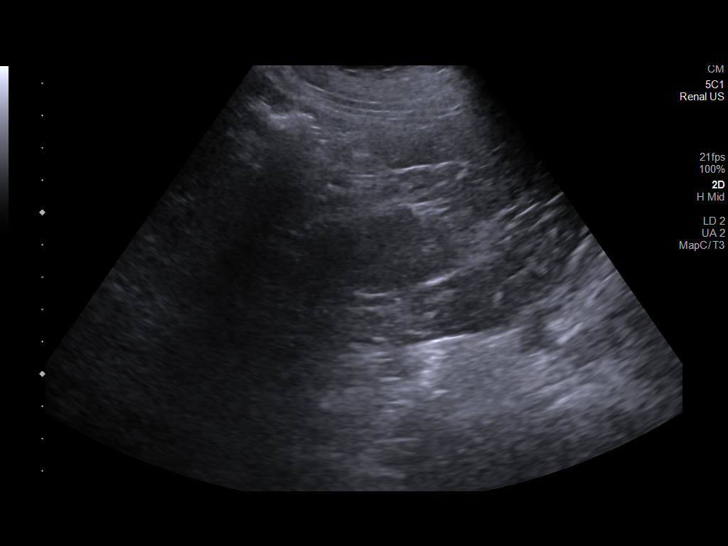
[im 40/48]
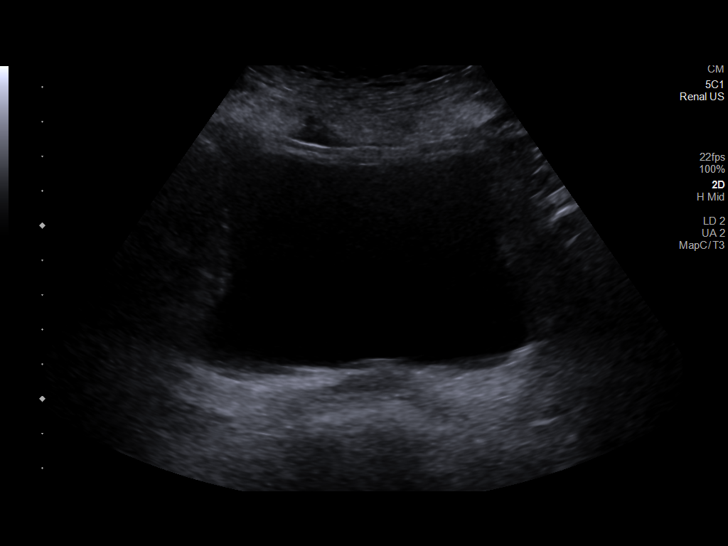
[im 44/48]
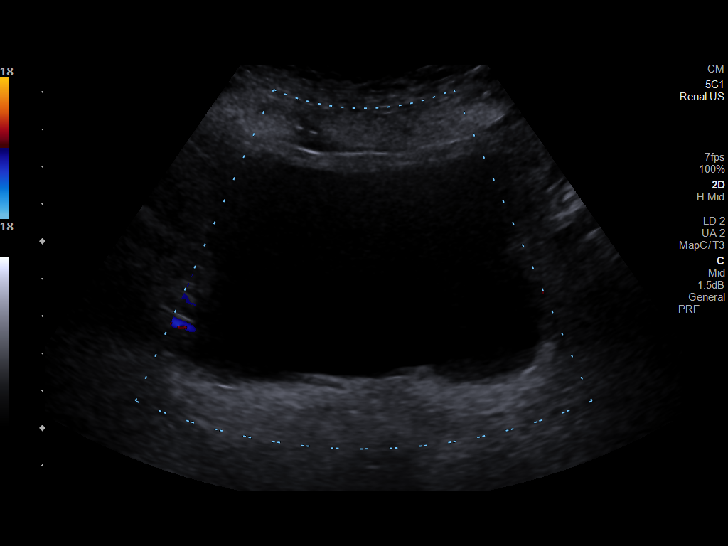
[im 48/48]
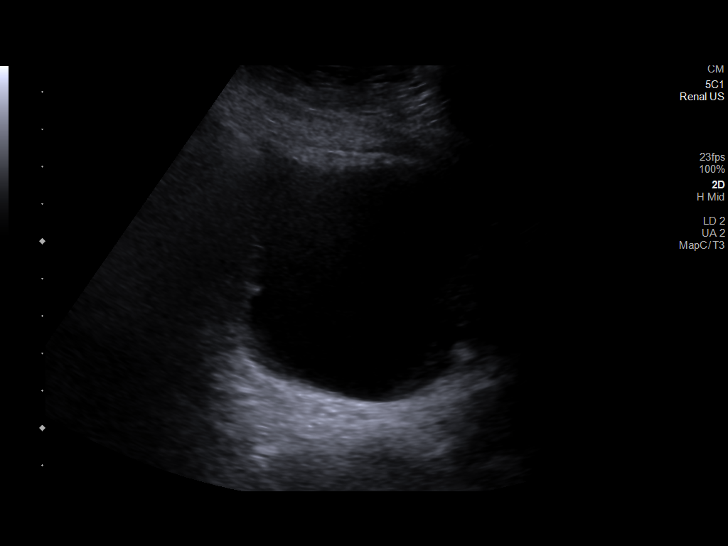

[14 of 25 positions shown; findings below may reference images not displayed]

FINDINGS: Right Kidney:

Renal measurements: 9.6 x 4.9 x 4.9 cm = volume: 120.9 mL. Normal
renal cortical thickness. Increased echogenicity could suggest
medical renal disease. No renal lesions or hydronephrosis.

Left Kidney:

Renal measurements: 11.4 x 5.5 x 5.4 cm = volume: 176.6 mL. Mild
increased echogenicity but normal renal cortical thickness. No renal
lesions or hydronephrosis.

Bladder:

Normal

Other:

None.
IMPRESSION: 1. Increased echogenicity of the kidneys suggesting medical renal
disease.
2. No renal lesions or hydronephrosis.

## 2020-12-08 IMAGING — DX DG CHEST 1V PORT
1 series · 1 of 1 positions shown · non-contrast
Comparison: Portable exam [99] hours compared to [DATE]

CLINICAL DATA: Cough, altered mental status, diabetes mellitus,
hypertension

EXAM:
PORTABLE CHEST 1 VIEW

[chest ap]
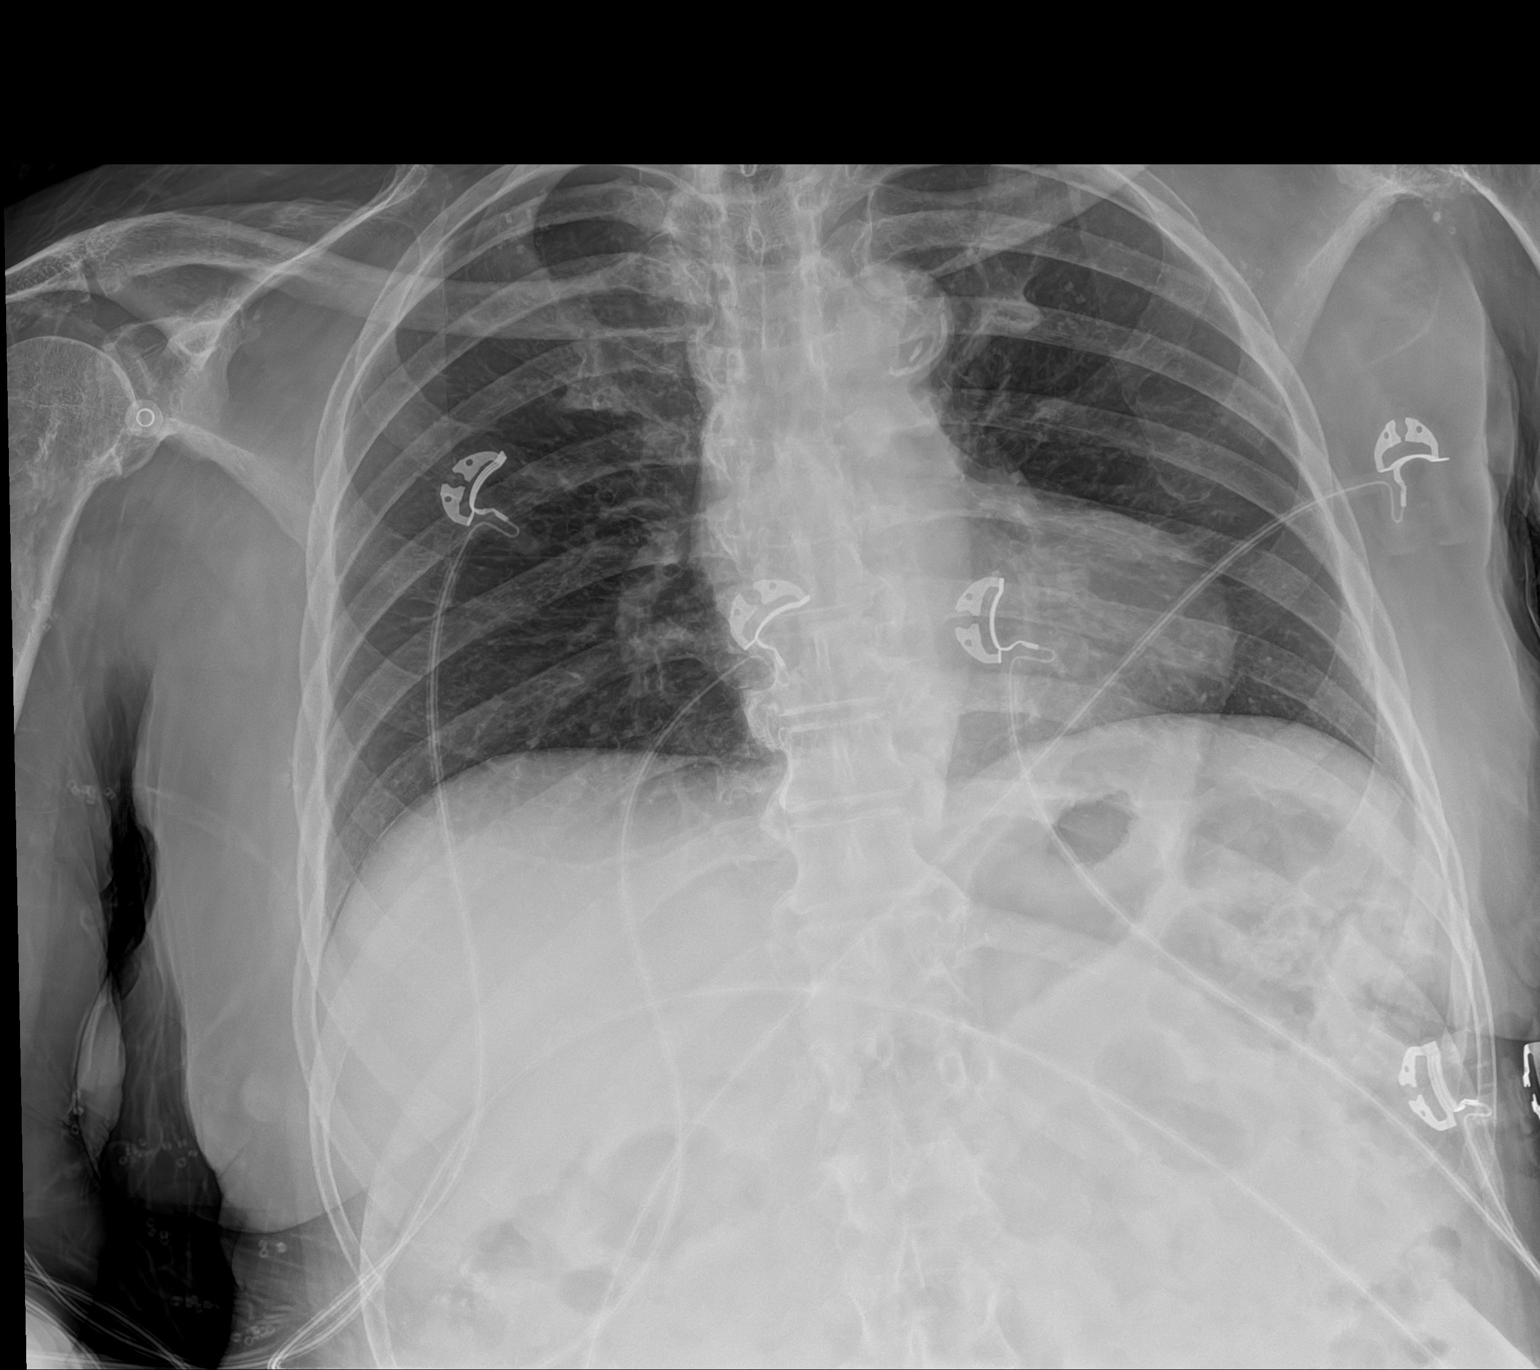

[1 of 1 positions shown; findings below may reference images not displayed]

FINDINGS: Normal heart size, mediastinal contours, and pul Aortic
Atherosclerosis ([99]-[99]). monary vascularity.

Atherosclerotic calcification aorta.

Lungs clear.

No pulmonary infiltrate, pleural effusion, or pneumothorax.

Bones demineralized with degenerative changes thoracic spine.
IMPRESSION: No acute abnormalities.

## 2020-12-08 MED ORDER — ACETAMINOPHEN 650 MG RE SUPP: 650.0000 mg | Freq: Four times a day (QID) | RECTAL | Status: DC | PRN

## 2020-12-08 MED ORDER — SODIUM ZIRCONIUM CYCLOSILICATE 5 G PO PACK
10.0000 g | PACK | Freq: Once | ORAL | Status: AC
  Administered 2020-12-08: 10 g via ORAL
  Filled 2020-12-08: qty 2

## 2020-12-08 MED ORDER — ENSURE ENLIVE PO LIQD
237.0000 mL | Freq: Two times a day (BID) | ORAL | Status: DC
  Administered 2020-12-12 – 2020-12-13 (×3): 237 mL via ORAL

## 2020-12-08 MED ORDER — SODIUM CHLORIDE 0.9 % IV BOLUS
500.0000 mL | Freq: Once | INTRAVENOUS | Status: AC
  Administered 2020-12-08: 500 mL via INTRAVENOUS

## 2020-12-08 MED ORDER — HEPARIN SODIUM (PORCINE) 5000 UNIT/ML IJ SOLN
5000.0000 [IU] | Freq: Three times a day (TID) | INTRAMUSCULAR | Status: DC
  Administered 2020-12-08 – 2020-12-14 (×18): 5000 [IU] via SUBCUTANEOUS
  Filled 2020-12-08 (×17): qty 1

## 2020-12-08 MED ORDER — SODIUM CHLORIDE 0.9% FLUSH
10.0000 mL | Freq: Two times a day (BID) | INTRAVENOUS | Status: DC
  Administered 2020-12-08 – 2020-12-12 (×8): 10 mL

## 2020-12-08 MED ORDER — INSULIN ASPART 100 UNIT/ML IJ SOLN
0.0000 [IU] | INTRAMUSCULAR | Status: DC
  Administered 2020-12-08 – 2020-12-09 (×4): 2 [IU] via SUBCUTANEOUS
  Administered 2020-12-09: 1 [IU] via SUBCUTANEOUS
  Administered 2020-12-12: 0 [IU] via SUBCUTANEOUS
  Filled 2020-12-08: qty 1

## 2020-12-08 MED ORDER — INSULIN ASPART 100 UNIT/ML IV SOLN
5.0000 [IU] | Freq: Once | INTRAVENOUS | Status: AC
  Administered 2020-12-08: 5 [IU] via INTRAVENOUS

## 2020-12-08 MED ORDER — DEXTROSE 50 % IV SOLN
1.0000 | Freq: Once | INTRAVENOUS | Status: AC
  Administered 2020-12-08: 50 mL via INTRAVENOUS
  Filled 2020-12-08: qty 50

## 2020-12-08 MED ORDER — ASPIRIN EC 81 MG PO TBEC
81.0000 mg | DELAYED_RELEASE_TABLET | Freq: Every evening | ORAL | Status: DC
  Administered 2020-12-08 – 2020-12-13 (×5): 81 mg via ORAL
  Filled 2020-12-08 (×5): qty 1

## 2020-12-08 MED ORDER — DEXTROSE 10 % IV SOLN: Freq: Once | INTRAVENOUS | Status: AC

## 2020-12-08 MED ORDER — ONDANSETRON HCL 4 MG/2ML IJ SOLN: 4.0000 mg | Freq: Four times a day (QID) | INTRAMUSCULAR | Status: DC | PRN

## 2020-12-08 MED ORDER — CHLORHEXIDINE GLUCONATE CLOTH 2 % EX PADS
6.0000 | MEDICATED_PAD | Freq: Every day | CUTANEOUS | Status: DC
  Administered 2020-12-08 – 2020-12-13 (×6): 6 via TOPICAL

## 2020-12-08 MED ORDER — SODIUM CHLORIDE 0.9 % IV SOLN: INTRAVENOUS | Status: DC

## 2020-12-08 MED ORDER — LORAZEPAM 2 MG/ML IJ SOLN: 2.0000 mg | INTRAMUSCULAR | Status: DC | PRN

## 2020-12-08 MED ORDER — SODIUM BICARBONATE 8.4 % IV SOLN
INTRAVENOUS | Status: AC
  Filled 2020-12-08: qty 1000

## 2020-12-08 MED ORDER — ACETAMINOPHEN 325 MG PO TABS: 650.0000 mg | ORAL_TABLET | Freq: Four times a day (QID) | ORAL | Status: DC | PRN

## 2020-12-08 MED ORDER — ONDANSETRON HCL 4 MG PO TABS: 4.0000 mg | ORAL_TABLET | Freq: Four times a day (QID) | ORAL | Status: DC | PRN

## 2020-12-08 MED ORDER — SODIUM CHLORIDE 0.9% FLUSH: 10.0000 mL | INTRAVENOUS | Status: DC | PRN

## 2020-12-08 MED ORDER — CALCIUM GLUCONATE 10 % IV SOLN
1.0000 g | Freq: Once | INTRAVENOUS | Status: AC
  Administered 2020-12-08: 1 g via INTRAVENOUS
  Filled 2020-12-08: qty 10

## 2020-12-08 NOTE — ED Notes (Signed)
Attempted report X1

## 2020-12-08 NOTE — ED Notes (Signed)
Pt unable to sign MSE due to AMS.  

## 2020-12-08 NOTE — Plan of Care (Signed)
  Problem: Education: Goal: Knowledge of General Education information will improve Description: Including pain rating scale, medication(s)/side effects and non-pharmacologic comfort measures Outcome: Not Progressing   Problem: Health Behavior/Discharge Planning: Goal: Ability to manage health-related needs will improve Outcome: Not Progressing   Problem: Clinical Measurements: Goal: Ability to maintain clinical measurements within normal limits will improve Outcome: Progressing Goal: Will remain free from infection Outcome: Progressing Goal: Diagnostic test results will improve Outcome: Progressing Goal: Respiratory complications will improve Outcome: Progressing Goal: Cardiovascular complication will be avoided Outcome: Progressing   Problem: Activity: Goal: Risk for activity intolerance will decrease Outcome: Progressing   Problem: Nutrition: Goal: Adequate nutrition will be maintained Outcome: Not Progressing   Problem: Coping: Goal: Level of anxiety will decrease Outcome: Progressing   Problem: Elimination: Goal: Will not experience complications related to bowel motility Outcome: Progressing Goal: Will not experience complications related to urinary retention Outcome: Progressing   Problem: Pain Managment: Goal: General experience of comfort will improve Outcome: Progressing   Problem: Skin Integrity: Goal: Risk for impaired skin integrity will decrease Outcome: Progressing

## 2020-12-08 NOTE — ED Notes (Signed)
Lab is unable to obtain labs at this time d/t pt being a hard stick. MD made aware

## 2020-12-08 NOTE — ED Notes (Signed)
Pts IV infiltrated into left arm. Obvious swelling noted. Arm elevated w/ warm compress applied. MD made aware

## 2020-12-08 NOTE — ED Triage Notes (Signed)
Pt arrived by [facility] from [facility]. Staff states pt. Cannot keep her arms up and not acting her normal self.  Hx of CVA, Parkinsons, and dementia.

## 2020-12-08 NOTE — ED Notes (Signed)
Pt given a sip of thin liquids. Pt coughed while drinking. MD aware.  PT meds given in apple sauce. Pt tolerated two cups of applesauce with out choking / coughing

## 2020-12-08 NOTE — ED Notes (Signed)
Pts daughter requesting update by MD. MD made aware
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
## 2020-12-09 DIAGNOSIS — E876 Hypokalemia: Secondary | ICD-10-CM

## 2020-12-09 DIAGNOSIS — E875 Hyperkalemia: Secondary | ICD-10-CM

## 2020-12-09 DIAGNOSIS — G9341 Metabolic encephalopathy: Secondary | ICD-10-CM

## 2020-12-09 DIAGNOSIS — E870 Hyperosmolality and hypernatremia: Secondary | ICD-10-CM

## 2020-12-09 LAB — LACTIC ACID, PLASMA
Lactic Acid, Venous: 3.8 mmol/L (ref 0.5–1.9)
Lactic Acid, Venous: 2.5 mmol/L (ref 0.5–1.9)

## 2020-12-09 LAB — URINE CULTURE: Culture: NO GROWTH

## 2020-12-09 LAB — CBC
HCT: 36.5 % (ref 36.0–46.0)
Hemoglobin: 11.1 g/dL — ABNORMAL LOW (ref 12.0–15.0)
MCH: 24.5 pg — ABNORMAL LOW (ref 26.0–34.0)
MCHC: 30.4 g/dL (ref 30.0–36.0)
MCV: 80.6 fL (ref 80.0–100.0)
Platelets: 211 10*3/uL (ref 150–400)
RBC: 4.53 MIL/uL (ref 3.87–5.11)
RDW: 15.8 % — ABNORMAL HIGH (ref 11.5–15.5)
WBC: 13.4 10*3/uL — ABNORMAL HIGH (ref 4.0–10.5)
nRBC: 0 % (ref 0.0–0.2)

## 2020-12-09 LAB — GLUCOSE, CAPILLARY
Glucose-Capillary: 181 mg/dL — ABNORMAL HIGH (ref 70–99)
Glucose-Capillary: 202 mg/dL — ABNORMAL HIGH (ref 70–99)
Glucose-Capillary: 172 mg/dL — ABNORMAL HIGH (ref 70–99)
Glucose-Capillary: 220 mg/dL — ABNORMAL HIGH (ref 70–99)
Glucose-Capillary: 225 mg/dL — ABNORMAL HIGH (ref 70–99)
Glucose-Capillary: 128 mg/dL — ABNORMAL HIGH (ref 70–99)
Glucose-Capillary: 186 mg/dL — ABNORMAL HIGH (ref 70–99)

## 2020-12-09 LAB — COMPREHENSIVE METABOLIC PANEL
ALT: 9 U/L (ref 0–44)
AST: 15 U/L (ref 15–41)
Albumin: 3.4 g/dL — ABNORMAL LOW (ref 3.5–5.0)
Alkaline Phosphatase: 88 U/L (ref 38–126)
Anion gap: 13 (ref 5–15)
BUN: 120 mg/dL — ABNORMAL HIGH (ref 8–23)
CO2: 28 mmol/L (ref 22–32)
Calcium: 9.2 mg/dL (ref 8.9–10.3)
Chloride: 115 mmol/L — ABNORMAL HIGH (ref 98–111)
Creatinine, Ser: 2.62 mg/dL — ABNORMAL HIGH (ref 0.44–1.00)
GFR, Estimated: 20 mL/min — ABNORMAL LOW (ref >60)
Glucose, Bld: 266 mg/dL — ABNORMAL HIGH (ref 70–99)
Potassium: 3.3 mmol/L — ABNORMAL LOW (ref 3.5–5.1)
Sodium: 156 mmol/L — ABNORMAL HIGH (ref 135–145)
Total Bilirubin: 0.3 mg/dL (ref 0.3–1.2)
Total Protein: 7.3 g/dL (ref 6.5–8.1)

## 2020-12-09 LAB — CREATININE, URINE, RANDOM: Creatinine, Urine: 98.66 mg/dL

## 2020-12-09 LAB — MAGNESIUM: Magnesium: 2.4 mg/dL (ref 1.7–2.4)

## 2020-12-09 LAB — SODIUM, URINE, RANDOM: Sodium, Ur: 54 mmol/L

## 2020-12-09 MED ORDER — POTASSIUM CHLORIDE 10 MEQ/100ML IV SOLN
10.0000 meq | Freq: Once | INTRAVENOUS | Status: AC
  Administered 2020-12-09: 10 meq via INTRAVENOUS
  Filled 2020-12-09: qty 100

## 2020-12-09 MED ORDER — SODIUM CHLORIDE 0.45 % IV SOLN: INTRAVENOUS | Status: DC

## 2020-12-09 NOTE — Progress Notes (Addendum)
PROGRESS NOTE    Amanda Fritz  LOV:[id number] DOB: 12-30-1955 DOA: 12/08/2020 PCP: Galvin Proffer, MD    Chief Complaint  Patient presents with   Altered Mental Status    Brief Narrative:  As per H&P written by Dr. Sherryll Burger on 12/08/2020 Amanda Fritz is a 65 y.o. female with medical history significant of dementia, dm, dysphagia, htn, parkinson like disorder, cva.   Spoke with the daughter, on 11/21/20 the patient was said to have 'seizure like' activity. She was sent to [facility] for MRI as it was not available at [facility] on the weekend. Several EEG performed at [facility] and MRI without acute finding. Patient was placed on Keppra at that time.  Per neurology note 9/15, 'we cannot completely exclude seizure and will continue low dose Keppra and have her follow up with outpatient neurology.' Per daughter, patient was (9/10-13/2022 timeframe) conversational, alert, confused regarding specific time and date, ambulatory with a walker. Per daughter, over the last several days the daughter has noticed the pt become more 'zombiefied' and not aware, not conversational, non mobile.  Assessment & Plan: 1-acute metabolic encephalopathy -Multifactorial: Including hypernatremia, uremia and recent use of Keppra. -Mentation improved, and is slowly following commands today. -Continue adjusted dose of IV fluids to continue improving renal function, BUN and sodium level. -Continue supportive care as per speech therapy evaluation.  2-hyperkalemia/hypokalemia -Status post calcium gluconate and the use of Lokelma -Potassium down to 3.3 currently -Will replete and continue monitoring on telemetry.  3-dehydration/uremia and hypernatremia -In the setting of prerenal azotemia -Free water deficit estimated to be around 3 L -Continue adjusted dose of IV fluids -Follow basic metabolic panel.  4-hypertension -Stable vital signs -Continue holding oral antihypertensive agents at this point.  5-diabetes mellitus -Holding  metformin -Continue sliding scale insulin. -Continue close monitoring of patient's CBGs with further adjustment as needed.  6-dementia/Parkinson's-like disorder and depression -Continue holding Aricept, mirtazapine and significant  7-questionable history of seizure disorder -Continue close monitoring in the stepdown unit -If needed will use Ativan for seizure suppression -Continue holding Keppra.  DVT prophylaxis: Heparin Code Status: Full Code. Family Communication: (no family at bedside. Disposition:   Status is: Inpatient  Remains inpatient appropriate because:IV treatments appropriate due to intensity of illness or inability to take PO  Dispo: The patient is from: SNF              Anticipated d/c is to: SNF              Patient currently is not medically stable to d/c.   Difficult to place patient No       Consultants:  None  Procedures:  See below for x-ray reports  Antimicrobials:  None   Subjective: Pleasantly confused; intermittently follows simple commands.  No chest pain, no nausea, no vomiting.  Objective: Vitals:   12/09/20 1640 12/09/20 1700 12/09/20 1800 12/09/20 1900  BP:  126/79 135/71 129/77  Pulse:   96   Resp: 14 16 17 18   Temp: 98.7 F (37.1 C)     TempSrc: Axillary     SpO2:  100% 100%   Weight:      Height:        Intake/Output Summary (Last 24 hours) at 12/09/2020 1924 Last data filed at 12/09/2020 1600 Gross per 24 hour  Intake 1388.2 ml  Output 750 ml  Net 638.2 ml   Filed Weights   12/08/20 0721 12/08/20 2046 12/09/20 0500  Weight: 69.4 kg 61.4 kg 60 kg  Examination:  General exam: Appears calm and able to follow simple commands intermittently; pleasantly confused.  No seizure activity appreciated.  Denies chest pain, abdominal pain, nausea, vomiting or shortness of breath. Respiratory system: Clear to auscultation. Respiratory effort normal.  Saturation on room air. Cardiovascular system: S1 & S2 heard, RRR. No JVD,  murmurs, rubs, gallops or clicks. No pedal edema. Gastrointestinal system: Abdomen is nondistended, soft and nontender. No organomegaly or masses felt. Normal bowel sounds heard. Central nervous system: Alert and oriented. No focal neurological deficits. Extremities: No cyanosis or clubbing; no edema on exam. Skin: No petechiae. Psychiatry: Judgement and insight appear normal. Mood & affect appropriate.     Data Reviewed: I have personally reviewed following labs and imaging studies  CBC: Recent Labs  Lab 12/08/20 1135 12/09/20 0243  WBC 17.2* 13.4*  NEUTROABS 12.5*  --   HGB 13.6 11.1*  HCT 45.9 36.5  MCV 83.3 80.6  PLT 272 211    Basic Metabolic Panel: Recent Labs  Lab 12/08/20 1135 12/08/20 1800 12/09/20 0243  NA 154* 155* 156*  K 6.1* 4.1 3.3*  CL 120* 121* 115*  CO2 14* 20* 28  GLUCOSE 177* 252* 266*  BUN 135* 128* 120*  CREATININE 4.07* 3.54* 2.62*  CALCIUM 10.1 10.1 9.2  MG  --   --  2.4    GFR: Estimated Creatinine Clearance: 17.5 mL/min (A) (by C-G formula based on SCr of 2.62 mg/dL (H)).  Liver Function Tests: Recent Labs  Lab 12/08/20 1135 12/09/20 0243  AST 27 15  ALT 9 9  ALKPHOS 107 88  BILITOT 0.6 0.3  PROT 9.0* 7.3  ALBUMIN 4.1 3.4*    CBG: Recent Labs  Lab 12/09/20 0351 12/09/20 0715 12/09/20 0906 12/09/20 1155 12/09/20 1638  GLUCAP 225* 202* 181* 172* 128*    Recent Results (from the past 240 hour(s))  Urine Culture     Status: None   Collection Time: 12/08/20 10:00 AM   Specimen: In/Out Cath Urine  Result Value Ref Range Status   Specimen Description   Final    IN/OUT CATH URINE Performed at [facility], [address], [place], [place] [address]    Special Requests   Final    NONE Performed at [facility], [address] [place], [place] [address]    Culture   Final    NO GROWTH Performed at [facility], [address] [place], [place] [address]    Report Status 12/09/2020 FINAL  Final  Resp Panel by  RT-PCR (Flu A&B, Covid) Nasopharyngeal Swab     Status: None   Collection Time: 12/08/20 12:00 PM   Specimen: Nasopharyngeal Swab; Nasopharyngeal(NP) swabs in vial transport medium  Result Value Ref Range Status   SARS Coronavirus 2 by RT PCR NEGATIVE NEGATIVE Final    Comment: (NOTE) SARS-CoV-2 target nucleic acids are NOT DETECTED.  The SARS-CoV-2 RNA is generally detectable in upper respiratory specimens during the acute phase of infection. The lowest concentration of SARS-CoV-2 viral copies this assay can detect is 138 copies/mL. A negative result does not preclude SARS-Cov-2 infection and should not be used as the sole basis for treatment or other patient management decisions. A negative result may occur with  improper specimen collection/handling, submission of specimen other than nasopharyngeal swab, presence of viral mutation(s) within the areas targeted by this assay, and inadequate number of viral copies(<138 copies/mL). A negative result must be combined with clinical observations, patient history, and epidemiological information. The expected result is  Negative.  Fact Sheet for Patients:  BloggerCourse.com  Fact Sheet for Healthcare Providers:  SeriousBroker.it  This test is no t yet approved or cleared by the [place] FDA and  has been authorized for detection and/or diagnosis of SARS-CoV-2 by FDA under an Emergency Use Authorization (EUA). This EUA will remain  in effect (meaning this test can be used) for the duration of the COVID-19 declaration under Section 564(b)(1) of the Act, 21 U.S.C.section 360bbb-3(b)(1), unless the authorization is terminated  or revoked sooner.       Influenza A by PCR NEGATIVE NEGATIVE Final   Influenza B by PCR NEGATIVE NEGATIVE Final    Comment: (NOTE) The Xpert Xpress SARS-CoV-2/FLU/RSV plus assay is intended as an aid in the diagnosis of influenza from Nasopharyngeal swab  specimens and should not be used as a sole basis for treatment. Nasal washings and aspirates are unacceptable for Xpert Xpress SARS-CoV-2/FLU/RSV testing.  Fact Sheet for Patients: BloggerCourse.com  Fact Sheet for Healthcare Providers: SeriousBroker.it  This test is not yet approved or cleared by the [place] FDA and has been authorized for detection and/or diagnosis of SARS-CoV-2 by FDA under an Emergency Use Authorization (EUA). This EUA will remain in effect (meaning this test can be used) for the duration of the COVID-19 declaration under Section 564(b)(1) of the Act, 21 U.S.C. section 360bbb-3(b)(1), unless the authorization is terminated or revoked.  Performed at [facility], [address] [place], [place] [address]   MRSA Next Gen by PCR, Nasal     Status: Abnormal   Collection Time: 12/08/20  8:00 PM   Specimen: Nasal Mucosa; Nasal Swab  Result Value Ref Range Status   MRSA by PCR Next Gen DETECTED (A) NOT DETECTED Final    Comment: RESULT CALLED TO, READ BACK BY AND VERIFIED WITH: KINDLEY,[email or web address]  BY MATTHEWS,B  9.27.22 (NOTE) The GeneXpert MRSA Assay (FDA approved for NASAL specimens only), is one component of a comprehensive MRSA colonization surveillance program. It is not intended to diagnose MRSA infection nor to guide or monitor treatment for MRSA infections. Test performance is not FDA approved in patients less than 45 years old. Performed at [facility], [address] [place], [place] [address]      Radiology Studies: CT HEAD WO CONTRAST ( )  Result Date: 12/08/2020 CLINICAL DATA:  Mental status change, unknown cause. EXAM: CT HEAD WITHOUT CONTRAST TECHNIQUE: Contiguous axial images were obtained from the base of the skull through the vertex without intravenous contrast. COMPARISON:  Head MRI 11/23/2020 and 11/22/2020. Head CT 11/21/2020. FINDINGS: Brain: There is no evidence of an acute infarct,  intracranial hemorrhage, mass, midline shift, or extra-axial fluid collection. Chronic lacunar infarcts are again seen in the corona radiata/basal ganglia regions bilaterally. Hypodensities elsewhere in the cerebral white matter bilaterally are unchanged and nonspecific but compatible with mild chronic small vessel ischemic disease. There is mild cerebral atrophy. A partially empty sella is again noted. Vascular: Calcified atherosclerosis at the skull base. No hyperdense vessel. Skull: No fracture or suspicious osseous lesion. Sinuses/Orbits: Visualized paranasal sinuses are clear. Small bilateral mastoid effusions. Unremarkable orbits. Other: None. IMPRESSION: 1. No evidence of acute intracranial abnormality. 2. Mild chronic small vessel ischemic disease. Electronically Signed   By: Sebastian Ache M.D.   On: 12/08/2020 09:56   US RENAL  Result Date: 12/08/2020 CLINICAL DATA:  Acute renal injury. EXAM: RENAL / URINARY TRACT ULTRASOUND COMPLETE COMPARISON:  None. FINDINGS: Right Kidney: Renal measurements: 9.6 x 4.9 x 4.9 cm = volume: 120.9 mL.  Normal renal cortical thickness. Increased echogenicity could suggest medical renal disease. No renal lesions or hydronephrosis. Left Kidney: Renal measurements: 11.4 x 5.5 x 5.4 cm = volume: 176.6 mL. Mild increased echogenicity but normal renal cortical thickness. No renal lesions or hydronephrosis. Bladder: Normal Other: None. IMPRESSION: 1. Increased echogenicity of the kidneys suggesting medical renal disease. 2. No renal lesions or hydronephrosis. Electronically Signed   By: Rudie Meyer M.D.   On: 12/08/2020 15:47   DG Chest Portable 1 View  Result Date: 12/08/2020 CLINICAL DATA:  Cough, altered mental status, diabetes mellitus, hypertension EXAM: PORTABLE CHEST 1 VIEW COMPARISON:  Portable exam 0758 hours compared to 11/22/2020 FINDINGS: Normal heart size, mediastinal contours, and pul Aortic Atherosclerosis (ICD10-I70.0). monary vascularity. Atherosclerotic  calcification aorta. Lungs clear. No pulmonary infiltrate, pleural effusion, or pneumothorax. Bones demineralized with degenerative changes thoracic spine. IMPRESSION: No acute abnormalities. Electronically Signed   By: Ulyses Southward M.D.   On: 12/08/2020 08:27     Scheduled Meds:  aspirin EC  81 mg Oral QPM   Chlorhexidine Gluconate Cloth  6 each Topical Daily   feeding supplement  237 mL Oral BID BM   heparin  5,000 Units Subcutaneous Q8H   insulin aspart  0-6 Units Subcutaneous Q4H   sodium chloride flush  10-40 mL Intracatheter Q12H   Continuous Infusions:  sodium chloride 75 mL/hr at 12/09/20 0901   potassium chloride       LOS: 1 day    Time spent: 35 minutes   Vassie Loll, MD [facility]   To contact the attending provider between 7A-7P or the covering provider during after hours 7P-7A, please log into the web site [email or web address] and access using universal [place] password for that web site. If you do not have the password, please call the hospital operator.  12/09/2020, 7:24 PM

## 2020-12-09 NOTE — Progress Notes (Signed)
Inpatient Diabetes Program Recommendations  AACE/ADA: New Consensus Statement on Inpatient Glycemic Control   Target Ranges:  Prepandial:   less than 140 mg/dL      Peak postprandial:   less than 180 mg/dL (1-2 hours)      Critically ill patients:  140 - 180 mg/dL   Results for [name], [name] (MRN [id number]) as of 12/09/2020 10:48  Ref. Range 11/27/2020 07:39 12/08/2020 07:35 12/08/2020 13:58 12/08/2020 17:22 12/08/2020 20:14 12/09/2020 07:15 12/09/2020 09:06  Glucose-Capillary Latest Ref Range: 70 - 99 mg/dL 784 (H) 696 (H) 295 (H) 202 (H) 207 (H) 202 (H) 181 (H)    Review of Glycemic Control  Diabetes history: DM2 Outpatient Diabetes medications: Novolog 0-9 units TID with meals, Metformin 1000 mg BID Current orders for Inpatient glycemic control: Novolog 0-6 units Q4H  Inpatient Diabetes Program Recommendations:    Insulin: May want to consider increasing Novolog correction to 0-9 units Q4H.  Thanks, Orlando Penner, RN, MSN, CDE Diabetes Coordinator Inpatient Diabetes Program [phone] (Team Pager from 8am to 5pm)

## 2020-12-09 NOTE — [place] FL2 (Signed)
[place] MEDICAID FL2 LEVEL OF CARE SCREENING TOOL     IDENTIFICATION  Patient Name: Amanda Fritz Birthdate: 1955-10-15 Sex: female Admission Date (Current Location): 12/08/2020  [facility] and [place] Number:  [employer] and Address:  [place],  [address] [place] [address]      Provider Number: [id number]  Attending Physician Name and Address:  Vassie Loll, MD  Relative Name and Phone Number:  Julianne Rice, [phone]    Current Level of Care: Hospital Recommended Level of Care: Skilled Nursing Facility Prior Approval Number:    Date Approved/Denied:   PASRR Number:    Discharge Plan: SNF    Current Diagnoses: Patient Active Problem List   Diagnosis Date Noted   Acute metabolic encephalopathy 12/08/2020   Left-sided weakness 11/21/2020   Dyslipidemia 05/10/2020   Acute postoperative pain 03/02/2019   Itching due to drug 03/02/2019   Postoperative anemia 03/02/2019   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   Diabetes mellitus, type 2 (HCC) 02/18/2019   H/O fall 02/18/2019   History of depression 02/18/2019   Hx of subdural hematoma 02/18/2019   Hypertension, uncontrolled 02/18/2019   CAD, 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/2019   Dysphagia 12/11/2018   Headache 12/11/2018   SDH (subdural hematoma) (HCC) 12/04/2018   Focal neurological deficit 08/16/2017   Confusion 07/06/2017   History of stroke 07/06/2017   Loss of memory 07/06/2017    Orientation RESPIRATION BLADDER Height & Weight     Self  Normal Incontinent Weight: 132 lb 4.4 oz (60 kg) Height:  5\' 3"  (160 cm)  BEHAVIORAL SYMPTOMS/MOOD NEUROLOGICAL BOWEL NUTRITION STATUS      Incontinent Diet (NPO time specified. See d/c summary for updates.)  AMBULATORY STATUS COMMUNICATION OF NEEDS Skin   Extensive Assist Non-Verbally Skin abrasions                       Personal Care Assistance Level of Assistance  Bathing, Feeding, Dressing Bathing Assistance: Maximum assistance Feeding  assistance: Limited assistance Dressing Assistance: Maximum assistance     Functional Limitations Info  Sight, Hearing, Speech Sight Info: Impaired Hearing Info: Adequate Speech Info:  (UTA)    SPECIAL CARE FACTORS FREQUENCY                       Contractures Contractures Info: Not present    Additional Factors Info  Code Status, Allergies, Psychotropic, Insulin Sliding Scale Code Status Info: Full code Allergies Info: Ace Inhibitors, Bactrim (Sulfamethoxazole-trimethoprim), Metformin, Neurontin (Gabapentin), Sulfamethoxazole, Trimethoprim, Aspirin Psychotropic Info: Aricept, Remeron, Zoloft         Current Medications (12/09/2020):  This is the current hospital active medication list Current Facility-Administered Medications  Medication Dose Route Frequency Provider Last Rate Last Admin   0.45 % sodium chloride infusion   Intravenous Continuous 12/11/2020, MD       acetaminophen (TYLENOL) tablet 650 mg  650 mg Oral Q6H PRN Vassie Loll, Pratik D, DO       Or   acetaminophen (TYLENOL) suppository 650 mg  650 mg Rectal Q6H PRN Sherryll Burger, Pratik D, DO       aspirin EC tablet 81 mg  81 mg Oral QPM Shah, Pratik D, DO   81 mg at 12/08/20 1752   Chlorhexidine Gluconate Cloth 2 % PADS 6 each  6 each Topical Daily Adefeso, Oladapo, DO   6 each at 12/08/20 2046   feeding supplement (ENSURE ENLIVE / ENSURE PLUS) liquid 237 mL  237 mL Oral BID BM Adefeso, Oladapo, DO       heparin injection 5,000 Units  5,000 Units Subcutaneous Q8H Shah, Pratik D, DO   5,000 Units at 12/09/20 0541   insulin aspart (novoLOG) injection 0-6 Units  0-6 Units Subcutaneous Q4H Sherryll Burger, Pratik D, DO   2 Units at 12/09/20 0404   LORazepam (ATIVAN) injection 2 mg  2 mg Intravenous Q4H PRN Sherryll Burger, Pratik D, DO       ondansetron (ZOFRAN) tablet 4 mg  4 mg Oral Q6H PRN Sherryll Burger, Pratik D, DO       Or   ondansetron (ZOFRAN) injection 4 mg  4 mg Intravenous Q6H PRN Sherryll Burger, Pratik D, DO       sodium chloride flush (NS) 0.9 %  injection 10-40 mL  10-40 mL Intracatheter Q12H Shah, Pratik D, DO   10 mL at 12/09/20 1914   sodium chloride flush (NS) 0.9 % injection 10-40 mL  10-40 mL Intracatheter PRN Maurilio Lovely D, DO         Discharge Medications: Please see discharge summary for a list of discharge medications.  Relevant Imaging Results:  Relevant Lab Results:   Additional Information SS# [id number] [place], [place]

## 2020-12-09 NOTE — TOC Initial Note (Signed)
Transition of Care [facility]) - Initial/Assessment Note    Patient Details  Name: Amanda Fritz MRN: [id number] Date of Birth: October 30, 1955  Transition [facility]) CM/SW Contact:    Amanda Cassis, LCSW Phone Number: 12/09/2020, 8:21 AM  Clinical Narrative:  Pt admitted due to acute metabolic encephalopathy. She has been a resident at [facility] and [facility] for about a year and a half and has been on the memory care unit. LCSW spoke with pt's daughter, Amanda Fritz to complete assessment. She requests return to [facility] when medically stable. Per Amanda Fritz at [facility], pt requires extensive assist with ADLs and is nursing level of care. Okay to return. Pt will need COVID test within 48 hours of d/c. TOC will continue to follow.                   Expected Discharge Plan: Skilled Nursing Facility Barriers to Discharge: Continued Medical Work up   Patient Goals and CMS Choice Patient states their goals for this hospitalization and ongoing recovery are:: Return to SNF   Choice offered to / list presented to : Adult Children  Expected Discharge Plan and Services Expected Discharge Plan: Skilled Nursing Facility In-house Referral: Clinical Social Work   Post Acute Care Choice: Skilled Nursing Facility Living arrangements for the past 2 months: Skilled Nursing Facility                 DME Arranged: N/A                    Prior Living Arrangements/Services Living arrangements for the past 2 months: Skilled Nursing Facility Lives with:: Facility Resident Patient language and need for interpreter reviewed:: Yes Do you feel safe going back to the place where you live?: Yes      Need for Family Participation in Patient Care: Yes (Comment) Care giver support system in place?: Yes (comment)   Criminal Activity/Legal Involvement Pertinent to Current Situation/Hospitalization: No - Comment as needed  Activities of Daily Living Home Assistive  Devices/Equipment: Dan Humphreys (specify type) ADL Screening (condition at time of admission) Patient's cognitive ability adequate to safely complete daily activities?: No Is the patient deaf or have difficulty hearing?: No Does the patient have difficulty seeing, even when wearing glasses/contacts?: No Does the patient have difficulty concentrating, remembering, or making decisions?: Yes Patient able to express need for assistance with ADLs?: Yes Does the patient have difficulty dressing or bathing?: Yes Independently performs ADLs?: No Communication: Dependent Is this a change from baseline?: Pre-admission baseline Dressing (OT): Dependent Is this a change from baseline?: Pre-admission baseline Grooming: Dependent Is this a change from baseline?: Pre-admission baseline Feeding: Dependent Is this a change from baseline?: Pre-admission baseline Bathing: Dependent Is this a change from baseline?: Pre-admission baseline Toileting: Dependent Is this a change from baseline?: Pre-admission baseline In/Out Bed: Dependent Is this a change from baseline?: Pre-admission baseline Walks in Home: Needs assistance Is this a change from baseline?: Pre-admission baseline Does the patient have difficulty walking or climbing stairs?: Yes Weakness of Legs: Both Weakness of Arms/Hands: Both  Permission Sought/Granted Permission sought to share information with : Facility Industrial/product designer granted to share information with : Yes, Verbal Permission Granted     Permission granted to share info w AGENCY: [facility]        Emotional Assessment   Attitude/Demeanor/Rapport: Unable to Assess Affect (typically observed): Unable to Assess Orientation: : Oriented to Self Alcohol / Substance Use: Not Applicable Psych Involvement:  No (comment)  Admission diagnosis:  Hyperkalemia [E87.5] Somnolence [R40.0] Hypernatremia [E87.0] AKI (acute kidney injury) (HCC) [N17.9] Acute renal  failure, unspecified acute renal failure type (HCC) [N17.9] Acute metabolic encephalopathy [G93.41] Patient Active Problem List   Diagnosis Date Noted   Acute metabolic encephalopathy 12/08/2020   Left-sided weakness 11/21/2020   Dyslipidemia 05/10/2020   Acute postoperative pain 03/02/2019   Itching due to drug 03/02/2019   Postoperative anemia 03/02/2019   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 [place] [address] Phone: [phone] Fax: [phone]     Social Determinants of Health (SDOH) Interventions    Readmission Risk Interventions Readmission Risk Prevention Plan 12/09/2020  Transportation Screening Complete  HRI or Home Care Consult Complete  Social Work Consult for Recovery Care Planning/Counseling Complete  Palliative Care Screening Not Applicable  Medication Review Oceanographer) Complete  Some recent data might be hidden

## 2020-12-09 NOTE — Progress Notes (Signed)
Initial Nutrition Assessment  DOCUMENTATION CODES:      INTERVENTION:  Recommend ST evaluation  When/if pt is cleared for diet advancement:  Ensure Enlive po BID, each supplement provides 350 kcal and 20 grams of protein   Assist with meals   NUTRITION DIAGNOSIS:   Inadequate oral intake related to inability to eat as evidenced by NPO status.   GOAL:  Patient will meet greater than or equal to 90% of their needs   MONITOR:  Diet advancement, Labs, Weight trends, Skin  REASON FOR ASSESSMENT:   Malnutrition Screening Tool    ASSESSMENT: Patient is a 65 yo female with hx of DM2, dysphagia, dementia, stroke, Parkinson's and mild cognitive impairment. Discharged 9/16 from [facility] to [facility]. She presents from facility with altered mental status, AKI with metabolic acidosis.   Patient noted coughing with liquids and is currently NPO. Pending ST evaluation? Patient minimally verbal response to questions. Chart reviewed. Diabetes coordinator following.    Medications reviewed and include: Novolog.  Daily weight monitoring ordered. Per review of weight encounters pt weight has decreased 9 kg (13%) this month and 21.6 kg (26%) the past 2 months. Question if bed weight is accurate?   IV- 0.45% NS @ 75 ml/hr.   Labs: BMP Latest Ref Rng & Units 12/09/2020 12/08/2020 12/08/2020  Glucose 70 - 99 mg/dL 798(X) 211(H) 417(E)  BUN 8 - 23 mg/dL 081(K) 481(E) 563(J)  Creatinine 0.44 - 1.00 mg/dL 4.97(W) 2.63(Z) 8.58(I)  Sodium 135 - 145 mmol/L 156(H) 155(H) 154(H)  Potassium 3.5 - 5.1 mmol/L 3.3(L) 4.1 6.1(H)  Chloride 98 - 111 mmol/L 115(H) 121(H) 120(H)  CO2 22 - 32 mmol/L 28 20(L) 14(L)  Calcium 8.9 - 10.3 mg/dL 9.2 50.2 77.4      NUTRITION - FOCUSED PHYSICAL EXAM:  Flowsheet Row Most Recent Value  Orbital Region No depletion  Upper Arm Region Mild depletion  Thoracic and Lumbar Region No depletion  Buccal Region No depletion  Temple Region No depletion  Clavicle Bone Region  Moderate depletion  Clavicle and Acromion Bone Region Mild depletion  Dorsal Hand No depletion  Patellar Region Moderate depletion  Anterior Thigh Region No depletion  Posterior Calf Region No depletion  Edema (RD Assessment) None  Hair Reviewed  Eyes Reviewed  Mouth Reviewed  Skin Reviewed  Nails Reviewed       Diet Order:   Diet Order             Diet NPO time specified Except for: Sips with Meds  Diet effective now                   EDUCATION NEEDS:  Not appropriate for education at this time  Skin:  Skin Assessment: Reviewed RN Assessment  Last BM:  unknown due to AMS  Height:   Ht Readings from Last 1 Encounters:  12/08/20 5\' 3"  (1.6 m)    Weight:   Wt Readings from Last 1 Encounters:  12/09/20 60 kg    Ideal Body Weight:   52 kg  BMI:  Body mass index is 23.43 kg/m.  Estimated Nutritional Needs:   Kcal:  1500-1600  Protein:  78-84 gr  Fluid:  >1500 ml daily   12/11/20 MS,RD,CSG,LDN Contact: Royann Shivers

## 2020-12-10 LAB — GLUCOSE, CAPILLARY
Glucose-Capillary: 126 mg/dL — ABNORMAL HIGH (ref 70–99)
Glucose-Capillary: 132 mg/dL — ABNORMAL HIGH (ref 70–99)
Glucose-Capillary: 132 mg/dL — ABNORMAL HIGH (ref 70–99)
Glucose-Capillary: 134 mg/dL — ABNORMAL HIGH (ref 70–99)
Glucose-Capillary: 148 mg/dL — ABNORMAL HIGH (ref 70–99)
Glucose-Capillary: 150 mg/dL — ABNORMAL HIGH (ref 70–99)

## 2020-12-10 LAB — BASIC METABOLIC PANEL
Anion gap: 10 (ref 5–15)
BUN: 63 mg/dL — ABNORMAL HIGH (ref 8–23)
CO2: 26 mmol/L (ref 22–32)
Calcium: 9 mg/dL (ref 8.9–10.3)
Chloride: 121 mmol/L — ABNORMAL HIGH (ref 98–111)
Creatinine, Ser: 1.36 mg/dL — ABNORMAL HIGH (ref 0.44–1.00)
GFR, Estimated: 43 mL/min — ABNORMAL LOW (ref >60)
Glucose, Bld: 151 mg/dL — ABNORMAL HIGH (ref 70–99)
Potassium: 4 mmol/L (ref 3.5–5.1)
Sodium: 157 mmol/L — ABNORMAL HIGH (ref 135–145)

## 2020-12-10 MED ORDER — MUPIROCIN 2 % EX OINT
1.0000 "application " | TOPICAL_OINTMENT | Freq: Two times a day (BID) | CUTANEOUS | Status: DC
  Administered 2020-12-10 – 2020-12-13 (×8): 1 via NASAL
  Filled 2020-12-10 (×2): qty 22
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
## 2020-12-10 NOTE — Progress Notes (Signed)
PROGRESS NOTE    Amanda Fritz  NOM:[id number] DOB: 05-Nov-1955 DOA: 12/08/2020 PCP: Galvin Proffer, MD    Chief Complaint  Patient presents with   Altered Mental Status    Brief Narrative:  As per H&P written by Dr. Sherryll Burger on 12/08/2020 Amanda Fritz is a 65 y.o. female with medical history significant of dementia, dm, dysphagia, htn, parkinson like disorder, cva.   Spoke with the daughter, on 11/21/20 the patient was said to have 'seizure like' activity. She was sent to [facility] for MRI as it was not available at [facility] on the weekend. Several EEG performed at [facility] and MRI without acute finding. Patient was placed on Keppra at that time.  Per neurology note 9/15, 'we cannot completely exclude seizure and will continue low dose Keppra and have her follow up with outpatient neurology.' Per daughter, patient was (9/10-13/2022 timeframe) conversational, alert, confused regarding specific time and date, ambulatory with a walker. Per daughter, over the last several days the daughter has noticed the pt become more 'zombiefied' and not aware, not conversational, non mobile.  Assessment & Plan: 1-acute metabolic encephalopathy -Multifactorial: Including hypernatremia, uremia and recent use of Keppra. -Mentation continued to improve, and is slowly following commands and clear by speech therapy for dysphagia 2 with thin liquids. -Continue adjusted dose of IV fluids to continue improving renal function, BUN and sodium level. -Continue supportive care and follow clinical response.  2-hyperkalemia/hypokalemia -Status post calcium gluconate and the use of Lokelma -Potassium repleted/corrected and 4.0 currently. -Will continue to follow electrolytes trend and continue telemetry monitoring.  3-dehydration/uremia and hypernatremia -In the setting of prerenal azotemia -Free water deficit estimated to be around 3 L at time of admission. -Continue adjusted dose of IV fluids -Follow basic metabolic  panel. -Patient evaluated by speech therapy and diet advanced to dysphagia 2 with thin liquids.  4-hypertension -Stable vital signs -Continue holding oral antihypertensive agents at this point.  5-diabetes mellitus -Continue holding metformin -Continue sliding scale insulin. -Continue close monitoring of patient's CBGs with further adjustment as needed.  6-dementia/Parkinson's-like disorder and depression -Continue holding Aricept, mirtazapine and Sinemet  7-questionable history of seizure disorder -Continue close monitoring in the stepdown unit -If needed will use Ativan for seizure suppression -Continue holding Keppra for now.  DVT prophylaxis: Heparin Code Status: Full Code. Family Communication: (no family at bedside. Disposition:   Status is: Inpatient  Remains inpatient appropriate because:IV treatments appropriate due to intensity of illness or inability to take PO  Dispo: The patient is from: SNF              Anticipated d/c is to: SNF              Patient currently is not medically stable to d/c.   Difficult to place patient No       Consultants:  None  Procedures:  See below for x-ray reports  Antimicrobials:  None   Subjective: Oriented x1, pleasantly confused and more alert today.  No nausea, no vomiting, no chest pain, no seizure.  Objective: Vitals:   12/10/20 1400 12/10/20 1500 12/10/20 1646 12/10/20 1700  BP: 123/81 134/61  (!) 152/134  Pulse:      Resp: 15 16 14 12   Temp:   98.1 F (36.7 C)   TempSrc:   Oral   SpO2:      Weight:      Height:        Intake/Output Summary (Last 24 hours) at 12/10/2020 1821 Last data  filed at 12/10/2020 1520 Gross per 24 hour  Intake 1741.56 ml  Output 500 ml  Net 1241.56 ml   Filed Weights   12/08/20 0721 12/08/20 2046 12/09/20 0500  Weight: 69.4 kg 61.4 kg 60 kg    Examination: General exam: Alert, appropriately tracking and following simple commands.  Patient oriented x1.  No chest pain, no  nausea, no vomiting.  No seizure appreciated. Respiratory system: Clear to auscultation. Respiratory effort normal.  Good saturation on room air. Cardiovascular system:RRR. No murmurs, rubs, gallops.  No JVD. Gastrointestinal system: Abdomen is nondistended, soft and nontender. No organomegaly or masses felt. Normal bowel sounds heard. Central nervous system: No new focal neurological deficits. Extremities: No cyanosis, clubbing or edema. Skin: No petechiae. Psychiatry: Judgement and insight appear impaired in the setting of underlying dementia and acute encephalopathy.   Data Reviewed: I have personally reviewed following labs and imaging studies  CBC: Recent Labs  Lab 12/08/20 1135 12/09/20 0243  WBC 17.2* 13.4*  NEUTROABS 12.5*  --   HGB 13.6 11.1*  HCT 45.9 36.5  MCV 83.3 80.6  PLT 272 211    Basic Metabolic Panel: Recent Labs  Lab 12/08/20 1135 12/08/20 1800 12/09/20 0243 12/10/20 0444  NA 154* 155* 156* 157*  K 6.1* 4.1 3.3* 4.0  CL 120* 121* 115* 121*  CO2 14* 20* 28 26  GLUCOSE 177* 252* 266* 151*  BUN 135* 128* 120* 63*  CREATININE 4.07* 3.54* 2.62* 1.36*  CALCIUM 10.1 10.1 9.2 9.0  MG  --   --  2.4  --     GFR: Estimated Creatinine Clearance: 33.7 mL/min (A) (by C-G formula based on SCr of 1.36 mg/dL (H)).  Liver Function Tests: Recent Labs  Lab 12/08/20 1135 12/09/20 0243  AST 27 15  ALT 9 9  ALKPHOS 107 88  BILITOT 0.6 0.3  PROT 9.0* 7.3  ALBUMIN 4.1 3.4*    CBG: Recent Labs  Lab 12/10/20 0057 12/10/20 0434 12/10/20 0722 12/10/20 1105 12/10/20 1645  GLUCAP 126* 132* 150* 132* 134*    Recent Results (from the past 240 hour(s))  Urine Culture     Status: None   Collection Time: 12/08/20 10:00 AM   Specimen: In/Out Cath Urine  Result Value Ref Range Status   Specimen Description   Final    IN/OUT CATH URINE Performed at [facility], [address], [place], [place] [address]    Special Requests   Final    NONE Performed at  [facility], [address] [place], [place] [address]    Culture   Final    NO GROWTH Performed at [facility], [address] [place], [place] [address]    Report Status 12/09/2020 FINAL  Final  Resp Panel by RT-PCR (Flu A&B, Covid) Nasopharyngeal Swab     Status: None   Collection Time: 12/08/20 12:00 PM   Specimen: Nasopharyngeal Swab; Nasopharyngeal(NP) swabs in vial transport medium  Result Value Ref Range Status   SARS Coronavirus 2 by RT PCR NEGATIVE NEGATIVE Final    Comment: (NOTE) SARS-CoV-2 target nucleic acids are NOT DETECTED.  The SARS-CoV-2 RNA is generally detectable in upper respiratory specimens during the acute phase of infection. The lowest concentration of SARS-CoV-2 viral copies this assay can detect is 138 copies/mL. A negative result does not preclude SARS-Cov-2 infection and should not be used as the sole basis for treatment or other patient management decisions. A negative result may occur with  improper specimen collection/handling, submission of  specimen other than nasopharyngeal swab, presence of viral mutation(s) within the areas targeted by this assay, and inadequate number of viral copies(<138 copies/mL). A negative result must be combined with clinical observations, patient history, and epidemiological information. The expected result is Negative.  Fact Sheet for Patients:  BloggerCourse.com  Fact Sheet for Healthcare Providers:  SeriousBroker.it  This test is no t yet approved or cleared by the [place] FDA and  has been authorized for detection and/or diagnosis of SARS-CoV-2 by FDA under an Emergency Use Authorization (EUA). This EUA will remain  in effect (meaning this test can be used) for the duration of the COVID-19 declaration under Section 564(b)(1) of the Act, 21 U.S.C.section 360bbb-3(b)(1), unless the authorization is terminated  or revoked sooner.       Influenza A  by PCR NEGATIVE NEGATIVE Final   Influenza B by PCR NEGATIVE NEGATIVE Final    Comment: (NOTE) The Xpert Xpress SARS-CoV-2/FLU/RSV plus assay is intended as an aid in the diagnosis of influenza from Nasopharyngeal swab specimens and should not be used as a sole basis for treatment. Nasal washings and aspirates are unacceptable for Xpert Xpress SARS-CoV-2/FLU/RSV testing.  Fact Sheet for Patients: BloggerCourse.com  Fact Sheet for Healthcare Providers: SeriousBroker.it  This test is not yet approved or cleared by the [place] FDA and has been authorized for detection and/or diagnosis of SARS-CoV-2 by FDA under an Emergency Use Authorization (EUA). This EUA will remain in effect (meaning this test can be used) for the duration of the COVID-19 declaration under Section 564(b)(1) of the Act, 21 U.S.C. section 360bbb-3(b)(1), unless the authorization is terminated or revoked.  Performed at [facility], [address], [place], [place] [address]   MRSA Next Gen by PCR, Nasal     Status: Abnormal   Collection Time: 12/08/20  8:00 PM   Specimen: Nasal Mucosa; Nasal Swab  Result Value Ref Range Status   MRSA by PCR Next Gen DETECTED (A) NOT DETECTED Final    Comment: RESULT CALLED TO, READ BACK BY AND VERIFIED WITH: KINDLEY,K@2224  BY MATTHEWS,B  9.27.22 (NOTE) The GeneXpert MRSA Assay (FDA approved for NASAL specimens only), is one component of a comprehensive MRSA colonization surveillance program. It is not intended to diagnose MRSA infection nor to guide or monitor treatment for MRSA infections. Test performance is not FDA approved in patients less than 1 years old. Performed at [facility], [address] [place], [place] [address]      Radiology Studies: No results found.   Scheduled Meds:  aspirin EC  81 mg Oral QPM   Chlorhexidine Gluconate Cloth  6 each Topical Daily   feeding supplement  237 mL Oral BID BM    heparin  5,000 Units Subcutaneous Q8H   insulin aspart  0-6 Units Subcutaneous Q4H   mupirocin ointment  1 application Nasal BID   sodium chloride flush  10-40 mL Intracatheter Q12H   Continuous Infusions:  sodium chloride 75 mL/hr at 12/10/20 1520     LOS: 2 days    Time spent: 35 minutes   Vassie Loll, MD [facility]   To contact the attending provider between 7A-7P or the covering provider during after hours 7P-7A, please log into the web site [email or web address] and access using universal [place] password for that web site. If you do not have the password, please call the hospital operator.  12/10/2020, 6:21 PM

## 2020-12-11 LAB — BASIC METABOLIC PANEL
Anion gap: 10 (ref 5–15)
BUN: 29 mg/dL — ABNORMAL HIGH (ref 8–23)
CO2: 26 mmol/L (ref 22–32)
Calcium: 9.2 mg/dL (ref 8.9–10.3)
Chloride: 117 mmol/L — ABNORMAL HIGH (ref 98–111)
Creatinine, Ser: 0.94 mg/dL (ref 0.44–1.00)
GFR, Estimated: >60 mL/min (ref >60)
Glucose, Bld: 135 mg/dL — ABNORMAL HIGH (ref 70–99)
Potassium: 3.3 mmol/L — ABNORMAL LOW (ref 3.5–5.1)
Sodium: 153 mmol/L — ABNORMAL HIGH (ref 135–145)

## 2020-12-11 LAB — GLUCOSE, CAPILLARY
Glucose-Capillary: 102 mg/dL — ABNORMAL HIGH (ref 70–99)
Glucose-Capillary: 106 mg/dL — ABNORMAL HIGH (ref 70–99)
Glucose-Capillary: 109 mg/dL — ABNORMAL HIGH (ref 70–99)
Glucose-Capillary: 121 mg/dL — ABNORMAL HIGH (ref 70–99)
Glucose-Capillary: 128 mg/dL — ABNORMAL HIGH (ref 70–99)

## 2020-12-11 MED ORDER — POTASSIUM CHLORIDE 10 MEQ/100ML IV SOLN
10.0000 meq | INTRAVENOUS | Status: AC
  Administered 2020-12-11 (×2): 10 meq via INTRAVENOUS
  Filled 2020-12-11 (×2): qty 100

## 2020-12-11 NOTE — Progress Notes (Signed)
PROGRESS NOTE    Amanda Fritz  FBP:[id number] DOB: 1955/03/19 DOA: 12/08/2020 PCP: Galvin Proffer, MD    Chief Complaint  Patient presents with   Altered Mental Status    Brief Narrative:  As per H&P written by Dr. Sherryll Burger on 12/08/2020 Amanda Fritz is a 65 y.o. female with medical history significant of dementia, dm, dysphagia, htn, parkinson like disorder, cva.   Spoke with the daughter, on 11/21/20 the patient was said to have 'seizure like' activity. She was sent to [facility] for MRI as it was not available at [facility] on the weekend. Several EEG performed at [facility] and MRI without acute finding. Patient was placed on Keppra at that time.  Per neurology note 9/15, 'we cannot completely exclude seizure and will continue low dose Keppra and have her follow up with outpatient neurology.' Per daughter, patient was (9/10-13/2022 timeframe) conversational, alert, confused regarding specific time and date, ambulatory with a walker. Per daughter, over the last several days the daughter has noticed the pt become more 'zombiefied' and not aware, not conversational, non mobile.  Assessment & Plan: 1-acute metabolic encephalopathy -Multifactorial: Including hypernatremia, uremia and recent use of Keppra. -Mentation continues slowly improving.  Following commands and tolerating dysphagia 2 diet with thin liquids. -Physical therapy have seen patient and recommending skilled nursing facility for further care and rehab. -No seizure activity appreciated. -Continue adjusted dose of IV fluids to continue improving renal function, BUN and sodium level. -Continue supportive care and follow clinical response. -Sodium level down to 153; creatinine and BUN are back to normal  2-hyperkalemia/hypokalemia -Status post calcium gluconate and the use of Lokelma -Potassium down to 3.3; will further replete and follow trend. -Will continue to follow electrolytes trend and continue telemetry monitoring.  3-dehydration/uremia  and hypernatremia -In the setting of prerenal azotemia -Free water deficit estimated to be around 3 L at time of admission. -Continue adjusted dose of IV fluids -Follow basic metabolic panel. -Continue dysphagia 2 diet.  4-hypertension -Stable vital signs -Continue holding oral antihypertensive agents at this point.  5-diabetes mellitus -Continue holding metformin -Continue sliding scale insulin. -Continue close monitoring of patient's CBGs with further adjustment as needed.  6-dementia/Parkinson's-like disorder and depression -Continue holding Aricept, mirtazapine and Sinemet  7-questionable history of seizure disorder -Continue close monitoring in the stepdown unit -If needed will use Ativan for seizure suppression -Continue holding Keppra for now; history appreciated.  DVT prophylaxis: Heparin Code Status: Full Code. Family Communication: (no family at bedside. Disposition:   Status is: Inpatient  Remains inpatient appropriate because:IV treatments appropriate due to intensity of illness or inability to take PO  Dispo: The patient is from: SNF              Anticipated d/c is to: SNF              Patient currently is not medically stable to d/c.   Difficult to place patient No       Consultants:  None  Procedures:  See below for x-ray reports  Antimicrobials:  None   Subjective: No seizure, pleasantly confused.  More alert today.  Tolerating dysphagia 2 diet with thin liquids.  Objective: Vitals:   12/11/20 0900 12/11/20 1000 12/11/20 1100 12/11/20 1200  BP: (!) 128/57 (!) 157/59 134/62 (!) 158/76  Pulse:   97   Resp: 16 15    Temp:   98.4 F (36.9 C)   TempSrc:   Oral   SpO2:      Weight:  Height:        Intake/Output Summary (Last 24 hours) at 12/11/2020 1725 Last data filed at 12/11/2020 1219 Gross per 24 hour  Intake 2256.23 ml  Output 1000 ml  Net 1256.23 ml   Filed Weights   12/08/20 2046 12/09/20 0500 12/11/20 0500  Weight: 61.4  kg 60 kg 61.5 kg    Examination: General exam: Patient appears more alert today; following simple commands unable to track.  Unknown baseline but has been able to tolerate dysphagia 2 diet with thin liquids.  No chest pain, no nausea, no vomiting.  Patient is afebrile. Respiratory system: Clear to auscultation. Respiratory effort normal.  Good saturation on room air. Cardiovascular system:RRR. No murmurs, rubs, gallops.  No JVD. Gastrointestinal system: Abdomen is nondistended, soft and nontender. No organomegaly or masses felt. Normal bowel sounds heard. Central nervous system: Alert and oriented. No focal neurological deficits. Extremities: No cyanosis or clubbing. Skin: No rashes, no petechiae. Psychiatry: Judgement and insight appear impaired secondary to dementia.  Data Reviewed: I have personally reviewed following labs and imaging studies  CBC: Recent Labs  Lab 12/08/20 1135 12/09/20 0243  WBC 17.2* 13.4*  NEUTROABS 12.5*  --   HGB 13.6 11.1*  HCT 45.9 36.5  MCV 83.3 80.6  PLT 272 211    Basic Metabolic Panel: Recent Labs  Lab 12/08/20 1135 12/08/20 1800 12/09/20 0243 12/10/20 0444 12/11/20 0443  NA 154* 155* 156* 157* 153*  K 6.1* 4.1 3.3* 4.0 3.3*  CL 120* 121* 115* 121* 117*  CO2 14* 20* 28 26 26   GLUCOSE 177* 252* 266* 151* 135*  BUN 135* 128* 120* 63* 29*  CREATININE 4.07* 3.54* 2.62* 1.36* 0.94  CALCIUM 10.1 10.1 9.2 9.0 9.2  MG  --   --  2.4  --   --     GFR: Estimated Creatinine Clearance: 48.7 mL/min (by C-G formula based on SCr of 0.94 mg/dL).  Liver Function Tests: Recent Labs  Lab 12/08/20 1135 12/09/20 0243  AST 27 15  ALT 9 9  ALKPHOS 107 88  BILITOT 0.6 0.3  PROT 9.0* 7.3  ALBUMIN 4.1 3.4*    CBG: Recent Labs  Lab 12/10/20 2012 12/11/20 0011 12/11/20 0416 12/11/20 0742 12/11/20 1141  GLUCAP 148* 106* 109* 128* 102*    Recent Results (from the past 240 hour(s))  Urine Culture     Status: None   Collection Time: 12/08/20  10:00 AM   Specimen: In/Out Cath Urine  Result Value Ref Range Status   Specimen Description   Final    IN/OUT CATH URINE Performed at [facility], [address], [place], [place]    Special Requests   Final    NONE Performed at [facility], [address], [place], [place]    Culture   Final    NO GROWTH Performed at [facility], [address] [place], [place]    Report Status 12/09/2020 FINAL  Final  Resp Panel by RT-PCR (Flu A&B, Covid) Nasopharyngeal Swab     Status: None   Collection Time: 12/08/20 12:00 PM   Specimen: Nasopharyngeal Swab; Nasopharyngeal(NP) swabs in vial transport medium  Result Value Ref Range Status   SARS Coronavirus 2 by RT PCR NEGATIVE NEGATIVE Final    Comment: (NOTE) SARS-CoV-2 target nucleic acids are NOT DETECTED.  The SARS-CoV-2 RNA is generally detectable in upper respiratory specimens during the acute phase of infection. The lowest concentration of SARS-CoV-2 viral copies this assay can  detect is 138 copies/mL. A negative result does not preclude SARS-Cov-2 infection and should not be used as the sole basis for treatment or other patient management decisions. A negative result may occur with  improper specimen collection/handling, submission of specimen other than nasopharyngeal swab, presence of viral mutation(s) within the areas targeted by this assay, and inadequate number of viral copies(<138 copies/mL). A negative result must be combined with clinical observations, patient history, and epidemiological information. The expected result is Negative.  Fact Sheet for Patients:  BloggerCourse.com  Fact Sheet for Healthcare Providers:  SeriousBroker.it  This test is no t yet approved or cleared by the [place] FDA and  has been authorized for detection and/or diagnosis of SARS-CoV-2 by FDA under an Emergency Use Authorization (EUA). This EUA will  remain  in effect (meaning this test can be used) for the duration of the COVID-19 declaration under Section 564(b)(1) of the Act, 21 U.S.C.section 360bbb-3(b)(1), unless the authorization is terminated  or revoked sooner.       Influenza A by PCR NEGATIVE NEGATIVE Final   Influenza B by PCR NEGATIVE NEGATIVE Final    Comment: (NOTE) The Xpert Xpress SARS-CoV-2/FLU/RSV plus assay is intended as an aid in the diagnosis of influenza from Nasopharyngeal swab specimens and should not be used as a sole basis for treatment. Nasal washings and aspirates are unacceptable for Xpert Xpress SARS-CoV-2/FLU/RSV testing.  Fact Sheet for Patients: BloggerCourse.com  Fact Sheet for Healthcare Providers: SeriousBroker.it  This test is not yet approved or cleared by the [place] FDA and has been authorized for detection and/or diagnosis of SARS-CoV-2 by FDA under an Emergency Use Authorization (EUA). This EUA will remain in effect (meaning this test can be used) for the duration of the COVID-19 declaration under Section 564(b)(1) of the Act, 21 U.S.C. section 360bbb-3(b)(1), unless the authorization is terminated or revoked.  Performed at [facility], [address], [place], [place] [address]   MRSA Next Gen by PCR, Nasal     Status: Abnormal   Collection Time: 12/08/20  8:00 PM   Specimen: Nasal Mucosa; Nasal Swab  Result Value Ref Range Status   MRSA by PCR Next Gen DETECTED (A) NOT DETECTED Final    Comment: RESULT CALLED TO, READ BACK BY AND VERIFIED WITH: KINDLEY,K@2224  BY MATTHEWS,B  9.27.22 (NOTE) The GeneXpert MRSA Assay (FDA approved for NASAL specimens only), is one component of a comprehensive MRSA colonization surveillance program. It is not intended to diagnose MRSA infection nor to guide or monitor treatment for MRSA infections. Test performance is not FDA approved in patients less than 72 years old. Performed at [facility], [address], [place], [place] [address]      Radiology Studies: No results found.   Scheduled Meds:  aspirin EC  81 mg Oral QPM   Chlorhexidine Gluconate Cloth  6 each Topical Daily   feeding supplement  237 mL Oral BID BM   heparin  5,000 Units Subcutaneous Q8H   insulin aspart  0-6 Units Subcutaneous Q4H   mupirocin ointment  1 application Nasal BID   sodium chloride flush  10-40 mL Intracatheter Q12H   Continuous Infusions:  sodium chloride 75 mL/hr at 12/11/20 1448     LOS: 3 days    Time spent: 35 minutes   Vassie Loll, MD [facility]   To contact the attending provider between 7A-7P or the covering provider during after hours 7P-7A, please log into the web site [email or web address] and access using universal [facility] password for that web site. If you do not have the password, please call the hospital operator.  12/11/2020, 5:25 PM

## 2020-12-11 NOTE — Plan of Care (Signed)
  Problem: Acute Rehab PT Goals(only PT should resolve) Goal: Pt Will Go Sit To Supine/Side Outcome: Progressing Flowsheets (Taken 12/11/2020 1144) Pt will go Sit to Supine/Side: with supervision Goal: Pt Will Transfer Bed To Chair/Chair To Bed Outcome: Progressing Flowsheets (Taken 12/11/2020 1144) Pt will Transfer Bed to Chair/Chair to Bed: with supervision Goal: Pt Will Ambulate Outcome: Progressing Flowsheets (Taken 12/11/2020 1144) Pt will Ambulate:  50 feet  with supervision  with rolling walker   11:44 AM, 12/11/20 M. Shary Decamp, PT, DPT Physical Therapist- [place] Office Number: [phone]

## 2020-12-11 NOTE — Care Management Important Message (Signed)
Important Message  Patient Details  Name: Amanda Fritz MRN: [id number] Date of Birth: Feb 15, 1956   Medicare Important Message Given:  Yes     Corey Harold 12/11/2020, 12:31 PM

## 2020-12-11 NOTE — Evaluation (Signed)
Physical Therapy Evaluation Patient Details Name: Amanda Fritz MRN: [id number] DOB: December 04, 1955 Today's Date: 12/11/2020  History of Present Illness  Amanda Fritz is a 65 y.o. female with medical history significant of dementia, dm, dysphagia, htn, parkinson like disorder, cva.     Spoke with the daughter, on 11/21/20 the patient was said to have 'seizure like' activity. She was sent to [facility] for MRI as it was not available at [facility] on the weekend. Several EEG performed at [facility] and MRI without acute finding. Patient was placed on Keppra at that time.  Per neurology note 9/15, 'we cannot completely exclude seizure and will continue low dose Keppra and have her follow up with outpatient neurology.' Per daughter, patient was (9/10-13/2022 timeframe) conversational, alert, confused regarding specific time and date, ambulatory with a walker. Per daughter, over the last several days the daughter has noticed the pt become more 'zombiefied' and not aware, not conversational, non mobile.   Clinical Impression   Patient presents with generalized weakness, reduced functional mobility, increased need for caregiver assistance, decreased ability to safely ambulate, high risk for falls, decreased ability to safely ambulate, decline in functional activity tolerance.  Patient requiring min-mod A for bed mobility, mod assist for functional transfers using RW for support. Gait performed on level surface with RW and min A for trunk support as pt tends to rely heavily on UE support throughout mobility requiring prompt seated rest periods after walking 15 ft with support.  Continued PT services indicated to improve general strength, improve dynamic balance, and decrease level of assistance with mobility to facilitate return to PLOF.        Recommendations for follow up therapy are one component of a multi-disciplinary discharge planning process, led by the attending physician.  Recommendations may be updated based on patient  status, additional functional criteria and insurance authorization.  Follow Up Recommendations SNF    Equipment Recommendations  None recommended by PT    Recommendations for Other Services       Precautions / Restrictions Restrictions Weight Bearing Restrictions: No      Mobility  Bed Mobility Overal bed mobility: Needs Assistance Bed Mobility: Rolling;Supine to Sit;Sit to Supine Rolling: Min guard Sidelying to sit: Min assist Supine to sit: Min assist;HOB elevated Sit to supine: Mod assist;HOB elevated Sit to sidelying: Mod assist   Patient Response: Cooperative  Transfers Overall transfer level: Needs assistance Equipment used: Rolling walker (2 wheeled) Transfers: Sit to/from Stand Sit to Stand: Mod assist         General transfer comment: poor tolerance to standing due to weakness/fatigue. Assist for control to sitting  Ambulation/Gait Ambulation/Gait assistance: Min assist;Mod assist Gait Distance (Feet): 15 Feet Assistive device: Rolling walker (2 wheeled) Gait Pattern/deviations: Step-through pattern;Decreased stride length;Decreased dorsiflexion - right;Decreased dorsiflexion - left;Drifts right/left;Narrow base of support Gait velocity: decreased   General Gait Details: excessive trunk flexion and UE support on RW  Stairs            Wheelchair Mobility    Modified Rankin (Stroke Patients Only)       Balance Overall balance assessment: Needs assistance Sitting-balance support: Feet supported;Single extremity supported Sitting balance-Leahy Scale: Fair Sitting balance - Comments: cues for attention to task Postural control: Posterior lean Standing balance support: Bilateral upper extremity supported;During functional activity Standing balance-Leahy Scale: Poor Standing balance comment: Pt reliant on BUE on RW vs external assist             High level balance activites:  Side stepping (mod A needed for trunk support)                Pertinent Vitals/Pain Pain Assessment: No/denies pain Faces Pain Scale: No hurt    Home Living Family/patient expects to be discharged to:: Skilled nursing facility   Available Help at Discharge: Skilled Nursing Facility Type of Home: Skilled Nursing Facility                Prior Function Level of Independence: Needs assistance   Gait / Transfers Assistance Needed: Per chart, amb with RW at facility     Comments: Per chart, pt resides in memory care unit. No family present. Pt unable to provide history.     Hand Dominance        Extremity/Trunk Assessment   Upper Extremity Assessment Upper Extremity Assessment: Generalized weakness    Lower Extremity Assessment Lower Extremity Assessment: Generalized weakness    Cervical / Trunk Assessment Cervical / Trunk Assessment: Kyphotic  Communication      Cognition Arousal/Alertness: Awake/alert Behavior During Therapy: Flat affect;Impulsive Overall Cognitive Status: History of cognitive impairments - at baseline Area of Impairment: Safety/judgement;Awareness;Following commands;Memory;Orientation;Attention;Problem solving                 Orientation Level: Place;Person Current Attention Level: Focused Memory: Decreased short-term memory;Decreased recall of precautions Following Commands: Follows one step commands consistently;Follows one step commands with increased time Safety/Judgement: Decreased awareness of safety;Decreased awareness of deficits Awareness: Intellectual Problem Solving: Slow processing;Difficulty sequencing;Requires verbal cues;Requires tactile cues        General Comments      Exercises     Assessment/Plan    PT Assessment Patient needs continued PT services  PT Problem List Decreased strength;Decreased mobility;Decreased balance;Decreased activity tolerance;Decreased cognition       PT Treatment Interventions Therapeutic activities;Gait training;Therapeutic  exercise;Patient/family education;Balance training;Functional mobility training;Neuromuscular re-education    PT Goals (Current goals can be found in the Care Plan section)  Acute Rehab PT Goals Patient Stated Goal: Return to PLOF PT Goal Formulation: Patient unable to participate in goal setting Time For Goal Achievement: 12/18/20 Potential to Achieve Goals: Fair    Frequency Min 3X/week   Barriers to discharge        Co-evaluation               AM-PAC PT "6 Clicks" Mobility  Outcome Measure Help needed turning from your back to your side while in a flat bed without using bedrails?: A Little Help needed moving from lying on your back to sitting on the side of a flat bed without using bedrails?: A Little Help needed moving to and from a bed to a chair (including a wheelchair)?: A Lot Help needed standing up from a chair using your arms (e.g., wheelchair or bedside chair)?: A Lot Help needed to walk in hospital room?: A Lot Help needed climbing 3-5 steps with a railing? : Total 6 Click Score: 13    End of Session Equipment Utilized During Treatment: Gait belt Activity Tolerance: Patient limited by fatigue Patient left: in bed;with call bell/phone within reach;with bed alarm set Nurse Communication: Mobility status PT Visit Diagnosis: Other abnormalities of gait and mobility (R26.89);Muscle weakness (generalized) (M62.81);Other symptoms and signs involving the nervous system (R29.898);Difficulty in walking, not elsewhere classified (R26.2)    Time: 4680-3212 PT Time Calculation (min) (ACUTE ONLY): 26 min   Charges:   PT Evaluation $PT Eval Moderate Complexity: 1 Mod PT Treatments $Gait Training: 8-22 mins  11:42 AM, 12/11/20 M. Shary Decamp, PT, DPT Physical Therapist- [place] Office Number: [phone]

## 2020-12-12 ENCOUNTER — Other Ambulatory Visit: Payer: Self-pay

## 2020-12-12 LAB — GLUCOSE, CAPILLARY
Glucose-Capillary: 98 mg/dL (ref 70–99)
Glucose-Capillary: 107 mg/dL — ABNORMAL HIGH (ref 70–99)
Glucose-Capillary: 93 mg/dL (ref 70–99)
Glucose-Capillary: 88 mg/dL (ref 70–99)
Glucose-Capillary: 120 mg/dL — ABNORMAL HIGH (ref 70–99)

## 2020-12-12 MED ORDER — INSULIN ASPART 100 UNIT/ML IJ SOLN: 0.0000 [IU] | Freq: Three times a day (TID) | INTRAMUSCULAR | Status: DC

## 2020-12-12 NOTE — Progress Notes (Signed)
PROGRESS NOTE    Amanda Fritz  ZOX:[id number] DOB: 1955-10-15 DOA: 12/08/2020 PCP: Galvin Proffer, MD    Chief Complaint  Patient presents with   Altered Mental Status    Brief Narrative:  As per H&P written by Dr. Sherryll Burger on 12/08/2020 Amanda Fritz is a 65 y.o. female with medical history significant of dementia, dm, dysphagia, htn, parkinson like disorder, cva.   Spoke with the daughter, on 11/21/20 the patient was said to have 'seizure like' activity. She was sent to [facility] for MRI as it was not available at [facility] on the weekend. Several EEG performed at [facility] and MRI without acute finding. Patient was placed on Keppra at that time.  Per neurology note 9/15, 'we cannot completely exclude seizure and will continue low dose Keppra and have her follow up with outpatient neurology.' Per daughter, patient was (9/10-13/2022 timeframe) conversational, alert, confused regarding specific time and date, ambulatory with a walker. Per daughter, over the last several days the daughter has noticed the pt become more 'zombiefied' and not aware, not conversational, non mobile.  Assessment & Plan: 1-acute metabolic encephalopathy -Multifactorial: Including hypernatremia, uremia and recent use of Keppra. -Mentation continues slowly improving.  Following commands and tolerating dysphagia 2 diet with thin liquids. -Physical therapy have seen patient and recommending skilled nursing facility for further care and rehab. -No seizure activity appreciated. -Continue adjusted dose of IV fluids to continue improving renal function, BUN and sodium level. -Continue supportive care and follow clinical response. -Continue to follow sodium level in AM.  2-hyperkalemia/hypokalemia -Status post calcium gluconate and the use of Lokelma -Potassium down to 3.3; will further replete and follow trend. -Will continue to follow electrolytes trend  -Psych to discontinue telemetry.  3-dehydration/uremia and hypernatremia -In  the setting of prerenal azotemia -Free water deficit estimated to be around 3 L at time of admission. -Continue adjusted dose of IV fluids -Follow basic metabolic panel. -Continue dysphagia 2 diet.  4-hypertension -Stable vital signs -Continue holding oral antihypertensive agents at this point.  5-diabetes mellitus -Continue holding metformin -Continue sliding scale insulin. -Continue close monitoring of patient's CBGs with further adjustment as needed.  6-dementia/Parkinson's-like disorder and depression -Continue holding Aricept, mirtazapine and Sinemet  7-questionable history of seizure disorder -Continue close monitoring in the stepdown unit -If needed will use Ativan for seizure suppression -Continue holding Keppra for now; history appreciated.  DVT prophylaxis: Heparin Code Status: Full Code. Family Communication: (no family at bedside. Disposition:   Status is: Inpatient  Remains inpatient appropriate because:IV treatments appropriate due to intensity of illness or inability to take PO  Dispo: The patient is from: SNF              Anticipated d/c is to: SNF              Patient currently is not medically stable to d/c.   Difficult to place patient No       Consultants:  None  Procedures:  See below for x-ray reports  Antimicrobials:  None   Subjective: No seizure, pleasantly confused and more interactive.  Denies chest pain, no nausea, no vomiting, no fever.  Objective: Vitals:   12/11/20 2000 12/12/20 0427 12/12/20 0606 12/12/20 1351  BP:   (!) 148/73 124/69  Pulse:   (!) 57 (!) 56  Resp:   14   Temp: (!) 97.2 F (36.2 C)  97.7 F (36.5 C) 97.6 F (36.4 C)  TempSrc: Temporal  Oral Oral  SpO2:  100% 100%  Weight:  63 kg    Height:        Intake/Output Summary (Last 24 hours) at 12/12/2020 1540 Last data filed at 12/12/2020 1300 Gross per 24 hour  Intake 240 ml  Output 1100 ml  Net -860 ml   Filed Weights   12/09/20 0500 12/11/20 0500  12/12/20 0427  Weight: 60 kg 61.5 kg 63 kg    Examination: General exam: Patient is more alert, participating in interview reporting no chest pain, no nausea or vomiting.  No seizure appreciated.  Tolerating dysphagia 2 diet with thin liquids and in no distress. Respiratory system: Clear to auscultation. Respiratory effort normal.  No requiring oxygen supplementation. Cardiovascular system:RRR. No murmurs, rubs, gallops.  No JVD. Gastrointestinal system: Abdomen is nondistended, soft and nontender. No organomegaly or masses felt. Normal bowel sounds heard. Central nervous system: Cranial nerves intact; no focal deficits seen.  Study limited secondary to underlying dementia. Extremities: No cyanosis, clubbing or edema. Skin: No petechiae. Psychiatry: Mood & affect appropriate.   Data Reviewed: I have personally reviewed following labs and imaging studies  CBC: Recent Labs  Lab 12/08/20 1135 12/09/20 0243  WBC 17.2* 13.4*  NEUTROABS 12.5*  --   HGB 13.6 11.1*  HCT 45.9 36.5  MCV 83.3 80.6  PLT 272 211    Basic Metabolic Panel: Recent Labs  Lab 12/08/20 1135 12/08/20 1800 12/09/20 0243 12/10/20 0444 12/11/20 0443  NA 154* 155* 156* 157* 153*  K 6.1* 4.1 3.3* 4.0 3.3*  CL 120* 121* 115* 121* 117*  CO2 14* 20* 28 26 26   GLUCOSE 177* 252* 266* 151* 135*  BUN 135* 128* 120* 63* 29*  CREATININE 4.07* 3.54* 2.62* 1.36* 0.94  CALCIUM 10.1 10.1 9.2 9.0 9.2  MG  --   --  2.4  --   --     GFR: Estimated Creatinine Clearance: 52.6 mL/min (by C-G formula based on SCr of 0.94 mg/dL).  Liver Function Tests: Recent Labs  Lab 12/08/20 1135 12/09/20 0243  AST 27 15  ALT 9 9  ALKPHOS 107 88  BILITOT 0.6 0.3  PROT 9.0* 7.3  ALBUMIN 4.1 3.4*    CBG: Recent Labs  Lab 12/11/20 1141 12/11/20 2002 12/12/20 0426 12/12/20 0728 12/12/20 1107  GLUCAP 102* 121* 98 107* 93    Recent Results (from the past 240 hour(s))  Urine Culture     Status: None   Collection Time:  12/08/20 10:00 AM   Specimen: In/Out Cath Urine  Result Value Ref Range Status   Specimen Description   Final    IN/OUT CATH URINE Performed at [facility], [address] [place], [place]    Special Requests   Final    NONE Performed at [facility], [address], [place], [place]    Culture   Final    NO GROWTH Performed at [facility], [address] [place], [place]    Report Status 12/09/2020 FINAL  Final  Resp Panel by RT-PCR (Flu A&B, Covid) Nasopharyngeal Swab     Status: None   Collection Time: 12/08/20 12:00 PM   Specimen: Nasopharyngeal Swab; Nasopharyngeal(NP) swabs in vial transport medium  Result Value Ref Range Status   SARS Coronavirus 2 by RT PCR NEGATIVE NEGATIVE Final    Comment: (NOTE) SARS-CoV-2 target nucleic acids are NOT DETECTED.  The SARS-CoV-2 RNA is generally detectable in upper respiratory specimens during the acute phase of infection. The lowest concentration of SARS-CoV-2  viral copies this assay can detect is 138 copies/mL. A negative result does not preclude SARS-Cov-2 infection and should not be used as the sole basis for treatment or other patient management decisions. A negative result may occur with  improper specimen collection/handling, submission of specimen other than nasopharyngeal swab, presence of viral mutation(s) within the areas targeted by this assay, and inadequate number of viral copies(<138 copies/mL). A negative result must be combined with clinical observations, patient history, and epidemiological information. The expected result is Negative.  Fact Sheet for Patients:  BloggerCourse.com  Fact Sheet for Healthcare Providers:  SeriousBroker.it  This test is no t yet approved or cleared by the [place] FDA and  has been authorized for detection and/or diagnosis of SARS-CoV-2 by FDA under an Emergency Use Authorization (EUA). This  EUA will remain  in effect (meaning this test can be used) for the duration of the COVID-19 declaration under Section 564(b)(1) of the Act, 21 U.S.C.section 360bbb-3(b)(1), unless the authorization is terminated  or revoked sooner.       Influenza A by PCR NEGATIVE NEGATIVE Final   Influenza B by PCR NEGATIVE NEGATIVE Final    Comment: (NOTE) The Xpert Xpress SARS-CoV-2/FLU/RSV plus assay is intended as an aid in the diagnosis of influenza from Nasopharyngeal swab specimens and should not be used as a sole basis for treatment. Nasal washings and aspirates are unacceptable for Xpert Xpress SARS-CoV-2/FLU/RSV testing.  Fact Sheet for Patients: BloggerCourse.com  Fact Sheet for Healthcare Providers: SeriousBroker.it  This test is not yet approved or cleared by the [place] FDA and has been authorized for detection and/or diagnosis of SARS-CoV-2 by FDA under an Emergency Use Authorization (EUA). This EUA will remain in effect (meaning this test can be used) for the duration of the COVID-19 declaration under Section 564(b)(1) of the Act, 21 U.S.C. section 360bbb-3(b)(1), unless the authorization is terminated or revoked.  Performed at [facility], [address], [place], [place] [address]   MRSA Next Gen by PCR, Nasal     Status: Abnormal   Collection Time: 12/08/20  8:00 PM   Specimen: Nasal Mucosa; Nasal Swab  Result Value Ref Range Status   MRSA by PCR Next Gen DETECTED (A) NOT DETECTED Final    Comment: RESULT CALLED TO, READ BACK BY AND VERIFIED WITH: KINDLEY,[email or web address]  BY MATTHEWS,B  9.27.22 (NOTE) The GeneXpert MRSA Assay (FDA approved for NASAL specimens only), is one component of a comprehensive MRSA colonization surveillance program. It is not intended to diagnose MRSA infection nor to guide or monitor treatment for MRSA infections. Test performance is not FDA approved in patients less than 48 years old. Performed  at [facility], [address], [place], [place] [address]      Radiology Studies: No results found.   Scheduled Meds:  aspirin EC  81 mg Oral QPM   Chlorhexidine Gluconate Cloth  6 each Topical Daily   feeding supplement  237 mL Oral BID BM   heparin  5,000 Units Subcutaneous Q8H   insulin aspart  0-6 Units Subcutaneous Q4H   mupirocin ointment  1 application Nasal BID   sodium chloride flush  10-40 mL Intracatheter Q12H   Continuous Infusions:  sodium chloride 75 mL/hr at 12/12/20 1014     LOS: 4 days    Time spent: 30 minutes   Vassie Loll, MD [facility]   To contact the attending provider between 7A-7P or the covering provider during after hours 7P-7A, please log into the web site [email or web address]  and access using universal  password for that web site. If you do not have the password, please call the hospital operator.  12/12/2020, 3:40 PM

## 2020-12-13 DIAGNOSIS — E1159 Type 2 diabetes mellitus with other circulatory complications: Secondary | ICD-10-CM

## 2020-12-13 DIAGNOSIS — N179 Acute kidney failure, unspecified: Secondary | ICD-10-CM

## 2020-12-13 DIAGNOSIS — E875 Hyperkalemia: Secondary | ICD-10-CM

## 2020-12-13 DIAGNOSIS — E870 Hyperosmolality and hypernatremia: Secondary | ICD-10-CM

## 2020-12-13 LAB — BASIC METABOLIC PANEL
Anion gap: 9 (ref 5–15)
BUN: 9 mg/dL (ref 8–23)
CO2: 22 mmol/L (ref 22–32)
Calcium: 8.6 mg/dL — ABNORMAL LOW (ref 8.9–10.3)
Chloride: 111 mmol/L (ref 98–111)
Creatinine, Ser: 0.64 mg/dL (ref 0.44–1.00)
GFR, Estimated: >60 mL/min (ref >60)
Glucose, Bld: 88 mg/dL (ref 70–99)
Potassium: 2.8 mmol/L — ABNORMAL LOW (ref 3.5–5.1)
Sodium: 142 mmol/L (ref 135–145)

## 2020-12-13 LAB — GLUCOSE, CAPILLARY
Glucose-Capillary: 80 mg/dL (ref 70–99)
Glucose-Capillary: 86 mg/dL (ref 70–99)
Glucose-Capillary: 117 mg/dL — ABNORMAL HIGH (ref 70–99)
Glucose-Capillary: 139 mg/dL — ABNORMAL HIGH (ref 70–99)
Glucose-Capillary: 142 mg/dL — ABNORMAL HIGH (ref 70–99)
Glucose-Capillary: 145 mg/dL — ABNORMAL HIGH (ref 70–99)

## 2020-12-13 LAB — SARS CORONAVIRUS 2 (TAT 6-24 HRS): SARS Coronavirus 2: NEGATIVE

## 2020-12-13 MED ORDER — POTASSIUM CHLORIDE CRYS ER 20 MEQ PO TBCR
40.0000 meq | EXTENDED_RELEASE_TABLET | ORAL | Status: AC
Start: 2020-12-13 — End: 2020-12-13
  Administered 2020-12-13 (×3): 40 meq via ORAL
  Filled 2020-12-13 (×3): qty 2

## 2020-12-13 MED ORDER — METFORMIN HCL 500 MG PO TABS: 500.0000 mg | ORAL_TABLET | Freq: Two times a day (BID) | ORAL | Status: AC

## 2020-12-13 MED ORDER — METOPROLOL TARTRATE 25 MG PO TABS: 25.0000 mg | ORAL_TABLET | Freq: Two times a day (BID) | ORAL | 2 refills | Status: AC

## 2020-12-13 MED ORDER — CARBIDOPA-LEVODOPA 25-100 MG PO TABS
1.5000 | ORAL_TABLET | Freq: Three times a day (TID) | ORAL | Status: DC
  Administered 2020-12-13 (×3): 1.5 via ORAL
  Filled 2020-12-13 (×4): qty 2

## 2020-12-13 MED ORDER — SERTRALINE HCL 50 MG PO TABS: 50.0000 mg | ORAL_TABLET | Freq: Every day | ORAL | 2 refills | Status: AC

## 2020-12-13 MED ORDER — SERTRALINE HCL 50 MG PO TABS
50.0000 mg | ORAL_TABLET | Freq: Every day | ORAL | Status: DC
  Administered 2020-12-13: 50 mg via ORAL
  Filled 2020-12-13 (×2): qty 1

## 2020-12-13 MED ORDER — ENSURE ENLIVE PO LIQD: 237.0000 mL | Freq: Two times a day (BID) | ORAL | Status: AC

## 2020-12-13 MED ORDER — FUROSEMIDE 40 MG PO TABS: 20.0000 mg | ORAL_TABLET | Freq: Two times a day (BID) | ORAL | Status: AC

## 2020-12-13 MED ORDER — METOPROLOL TARTRATE 25 MG PO TABS
25.0000 mg | ORAL_TABLET | Freq: Two times a day (BID) | ORAL | Status: DC
  Administered 2020-12-13 (×2): 25 mg via ORAL
  Filled 2020-12-13 (×3): qty 1

## 2020-12-13 NOTE — Progress Notes (Signed)
Report called to the receiving nurse at [facility]. Awaiting for transport.

## 2020-12-13 NOTE — TOC Transition Note (Signed)
Transition of Care [facility]) - CM/SW Discharge Note   Patient Details  Name: [name] MRN: [id number] Date of Birth: 1955/04/15  [facility] CM/SW Contact:  Villa Herb, LCSWA Phone Number: 12/13/2020, 12:56 PM   Clinical Narrative:    CSW updated of pts readiness for D/C. CSW spoke to [place] with [facility] who states pt may return to facility today. CSW spoke with pts daughter to update her on pts d/c, she is understanding. CSW set pt up with transportation through EMS. Pt needs to arrive to facility before 9pm. TOC signing off.   Final next level of care: Skilled Nursing Facility Barriers to Discharge: No Barriers Identified   Patient Goals and CMS Choice Patient states their goals for this hospitalization and ongoing recovery are:: Return to SNF CMS Medicare.gov Compare Post Acute Care list provided to:: Patient Represenative (must comment) Choice offered to / list presented to : Adult Children  Discharge Placement              Patient chooses bed at: [facility] Patient to be transferred to facility by: [facility] Name of family member notified: [name] Patient and family notified of of transfer: 12/13/20  Discharge Plan and Services In-house Referral: Clinical Social Work   Post Acute Care Choice: Skilled Nursing Facility          DME Arranged: N/A                    Social Determinants of Health (SDOH) Interventions     Readmission Risk Interventions Readmission Risk Prevention Plan 12/09/2020  Transportation Screening Complete  HRI or Home Care Consult Complete  Social Work Consult for Recovery Care Planning/Counseling Complete  Palliative Care Screening Not Applicable  Medication Review Oceanographer) Complete  Some recent data might be hidden

## 2020-12-13 NOTE — Discharge Summary (Addendum)
Physician Discharge Summary  Amanda Fritz CHY:[id number] DOB: [birth year] DOA: 12/08/2020  PCP: Bonnita Nasuti, MD  Admit date: 12/08/2020 Discharge date: 12/14/2020  Time spent: 35 minutes  Recommendations for Outpatient Follow-up:  Make sure patient take medications as prescribed Maintain adequate hydration Repeat basic metabolic panel in 5 days to evaluate electrolytes and renal function Close monitoring to patient's CBGs with further adjustment to hypoglycemic regimen as needed.   Discharge Diagnoses:  Active Problems:   Acute metabolic encephalopathy   Acute renal failure (HCC)   Hyperkalemia   Hypernatremia   Discharge Condition: Stable and improved.  Discharged back to skilled nursing facility with instruction to follow-up with PCP in 10 days.  CODE STATUS: Full code.  Diet recommendation: Heart healthy modified carbohydrate diet.  Filed Weights   12/12/20 0427 12/13/20 0500 12/13/20 0614  Weight: 63 kg 61 kg 63.4 kg    History of present illness:  As per H&P written by Dr. Manuella Ghazi on 12/08/2020 Amanda Fritz is a 65 y.o. female with medical history significant of dementia, dm, dysphagia, htn, parkinson like disorder, cva.   Spoke with the daughter, on 11/21/20 the patient was said to have 'seizure like' activity. She was sent to [facility] for MRI as it was not available at [facility] on the weekend. Several EEG performed at [facility] and MRI without acute finding. Patient was placed on Keppra at that time.  Per neurology note 9/15, 'we cannot completely exclude seizure and will continue low dose Keppra and have her follow up with outpatient neurology.' Per daughter, patient was (9/10-13/2022 timeframe) conversational, alert, confused regarding specific time and date, ambulatory with a walker. Per daughter, over the last several days the daughter has noticed the pt become more 'zombiefied' and not aware, not conversational, non mobile.  Hospital Course:  1-acute metabolic  encephalopathy -Multifactorial: Including hypernatremia, uremia and recent use of Keppra. -Mentation has improved and close to baseline at discharge. Patient is Following commands and tolerating dysphagia 2 diet with thin liquids. -Physical therapy have seen patient and recommending skilled nursing facility for further care and rehab. -No seizure activity appreciated, per neurology rec's will hold on antiepileptic drugs until upcoming follow up as an outpatient.. -Continue to maintain adequate hydration. -Continue supportive care and follow clinical response.   2-hyperkalemia/hypokalemia -Status post calcium gluconate and the use of Lokelma -Potassium down to 2.4 after initial treatment. -repleted and discharge on daily supplementaiton. -Repeat basic metabolic panel to follow electrolytes trend in 5 days.     3-dehydration/uremia and hypernatremia -In the setting of prerenal azotemia -Free water deficit estimated to be around 3 L at time of admission. -Continue dysphagia 2 diet and thin liquids for adequate hydration and nutrition.    4-hypertension -Stable vital signs -continue home antihypertensive regimen, dose adjusted.   5-diabetes mellitus -Continue to follow CBG's -A1C 6.6 -resume metformin.   6-dementia/Parkinson's-like disorder and depression -Continue Aricept, mirtazapine and Sinemet -mood stable overall.   7-questionable history of seizure disorder -Continue close monitoring in effort to capture any seizure activity. -Continue holding Keppra for now; EEG neg on recent work up -outpatient follow up with neurology.  Procedures: See below for x-ray reports.  Consultations: Neurology curbside.   Discharge Exam: Vitals:   12/13/20 1022 12/13/20 1024  BP: (!) 149/88 (!) 149/88  Pulse: 64   Resp:    Temp:    SpO2:     General exam: Patient is more alert, participating in interview reporting no chest pain, no nausea or vomiting.  No seizure appreciated.   Tolerating dysphagia 2 diet with thin liquids and in no distress. Respiratory system: Clear to auscultation. Respiratory effort normal.  No requiring oxygen supplementation. Cardiovascular system:RRR. No murmurs, rubs, gallops.  No JVD. Gastrointestinal system: Abdomen is nondistended, soft and nontender. No organomegaly or masses felt. Normal bowel sounds heard. Central nervous system: Cranial nerves intact; no focal deficits seen.  Study limited secondary to underlying dementia. Extremities: No cyanosis, clubbing or edema. Skin: No petechiae. Psychiatry: Mood & affect appropriate.    Discharge Instructions   Discharge Instructions     Diet - low sodium heart healthy   Complete by: As directed    Diet Carb Modified   Complete by: As directed    Discharge instructions   Complete by: As directed    Make sure patient take medications as prescribed Maintain adequate hydration Repeat basic metabolic panel in 5 days to evaluate electrolytes and renal function Close monitoring to patient's CBGs with further adjustment to hypoglycemic regimen as needed.      Allergies as of 12/13/2020       Reactions   Ace Inhibitors Other (See Comments)   Bactrim [sulfamethoxazole-trimethoprim]    headache   Metformin    Neurontin [gabapentin]    Headache   Sulfamethoxazole    Trimethoprim    Aspirin Other (See Comments)   Ok to take [facility] ASA        Medication List     STOP taking these medications    insulin aspart 100 UNIT/ML injection Commonly known as: novoLOG   levETIRAcetam 250 MG tablet Commonly known as: KEPPRA       TAKE these medications    acetaminophen 500 MG tablet Commonly known as: TYLENOL Take 1,000 mg by mouth every 8 (eight) hours as needed for mild pain.   aspirin EC 81 MG tablet Take 81 mg by mouth every evening. Swallow whole.   atorvastatin 80 MG tablet Commonly known as: LIPITOR Take 40 mg by mouth in the morning and at bedtime.    carbidopa-levodopa 25-100 MG tablet Commonly known as: SINEMET IR Take 1.5 tablets by mouth 3 (three) times daily.   cyanocobalamin 1000 MCG/ML injection Commonly known as: (VITAMIN B-12) Inject 1,000 mcg into the muscle every 30 (thirty) days.   donepezil 10 MG tablet Commonly known as: ARICEPT Take 10 mg by mouth at bedtime.   feeding supplement Liqd Take 237 mLs by mouth 2 (two) times daily between meals.   furosemide 40 MG tablet Commonly known as: LASIX Take 0.5 tablets (20 mg total) by mouth 2 (two) times daily. What changed:  how much to take when to take this   Glucagon Emergency 1 MG Kit Inject 1 mg as directed as needed (low bs and glucose below 60).   MELATONIN PO Take 6 mg by mouth at bedtime.   metFORMIN 500 MG tablet Commonly known as: GLUCOPHAGE Take 1 tablet (500 mg total) by mouth 2 (two) times daily. What changed: how much to take   metoprolol tartrate 25 MG tablet Commonly known as: LOPRESSOR Take 1 tablet (25 mg total) by mouth 2 (two) times daily. What changed:  medication strength how much to take   mirtazapine 7.5 MG tablet Commonly known as: REMERON Take 7.5 mg by mouth at bedtime.   nitroGLYCERIN 0.4 MG SL tablet Commonly known as: NITROSTAT Place 0.4 mg under the tongue every 5 (five) minutes as needed for chest pain.   Potassium Chloride ER 20 MEQ Tbcr Take 2 tablets  by mouth daily.   senna 8.6 MG tablet Commonly known as: SENOKOT Take 1 tablet by mouth every evening.   sertraline 50 MG tablet Commonly known as: ZOLOFT Take 1 tablet (50 mg total) by mouth daily. Start taking on: December 14, 2020 What changed:  medication strength how much to take when to take this       Allergies  Allergen Reactions   Ace Inhibitors Other (See Comments)   Bactrim [Sulfamethoxazole-Trimethoprim]     headache   Metformin    Neurontin [Gabapentin]     Headache    Sulfamethoxazole    Trimethoprim    Aspirin Other (See Comments)     Ok to take [facility] ASA    Follow-up Information     Hague, Rosalyn Charters, MD. Schedule an appointment as soon as possible for a visit in 10 day(s).   Specialty: Internal Medicine Contact information: [address] [place] [address] [phone]                 The results of significant diagnostics from this hospitalization (including imaging, microbiology, ancillary and laboratory) are listed below for reference.    Significant Diagnostic Studies: CT HEAD WO CONTRAST (5MM)  Result Date: 12/08/2020 CLINICAL DATA:  Mental status change, unknown cause. EXAM: CT HEAD WITHOUT CONTRAST TECHNIQUE: Contiguous axial images were obtained from the base of the skull through the vertex without intravenous contrast. COMPARISON:  Head MRI 11/23/2020 and 11/22/2020. Head CT 11/21/2020. FINDINGS: Brain: There is no evidence of an acute infarct, intracranial hemorrhage, mass, midline shift, or extra-axial fluid collection. Chronic lacunar infarcts are again seen in the corona radiata/basal ganglia regions bilaterally. Hypodensities elsewhere in the cerebral white matter bilaterally are unchanged and nonspecific but compatible with mild chronic small vessel ischemic disease. There is mild cerebral atrophy. A partially empty sella is again noted. Vascular: Calcified atherosclerosis at the skull base. No hyperdense vessel. Skull: No fracture or suspicious osseous lesion. Sinuses/Orbits: Visualized paranasal sinuses are clear. Small bilateral mastoid effusions. Unremarkable orbits. Other: None. IMPRESSION: 1. No evidence of acute intracranial abnormality. 2. Mild chronic small vessel ischemic disease. Electronically Signed   By: Logan Bores M.D.   On: 12/08/2020 09:56   MR ANGIO HEAD WO CONTRAST  Result Date: 11/22/2020 CLINICAL DATA:  cva; Stroke, follow up EXAM: MRI HEAD WITHOUT CONTRAST MRA HEAD WITHOUT CONTRAST MRA NECK WITHOUT AND WITH CONTRAST TECHNIQUE: Multiplanar, multi-echo pulse sequences of the  brain and surrounding structures were acquired without intravenous contrast. Angiographic images of the Circle of Willis were acquired using MRA technique without intravenous contrast. Angiographic images of the neck were acquired using MRA technique without and with intravenous contrast. Carotid stenosis measurements (when applicable) are obtained utilizing NASCET criteria, using the distal internal carotid diameter as the denominator. CONTRAST:  6.17mL GADAVIST GADOBUTROL 1 MMOL/ML IV SOLN COMPARISON:  CT 11/21/2020. FINDINGS: MRI HEAD FINDINGS Motion limited study.  Within this limitation: Brain: No acute infarction, hemorrhage, hydrocephalus, extra-axial collection or mass lesion. No pathologic intracranial enhancement. Moderate scattered T2 hyperintensities within the supraventricular and pontine white matter, nonspecific but most likely secondary to chronic microvascular ischemic disease given the patient's known risk factors (including diabetes and hypertension). 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 Small focus of susceptibility artifact within the left cerebellum and right thalamus, likely the sequela of prior microhemorrhage. Vascular: See below. Skull and upper cervical spine: Normal marrow signal. Sinuses/Orbits: Clear sinuses.  Unremarkable orbits. Other: Small bilateral  mastoid effusions. MRA HEAD FINDINGS Moderate to severely motion degraded study. This precludes adequate evaluation for, and accurate quantification of, intracranial arterial stenoses. This also precludes adequateevaluation for intracranial aneurysms. Anterior circulation: Bilateral intracranial ICAs are patent. Bilateral M1 MCAs are patent. Limited evaluation of the more distal MCA branches with some vascular flow related signal bilaterally. Bilateral A1 ACAs appear to be grossly patent. A2 ACA evaluation is essentially nondiagnostic due to extensive motion. Posterior  circulation: Bilateral intradural vertebral arteries, basilar artery and proximal posterior cerebral arteries are patent. MRA NECK FINDINGS Motion limited study. Aortic arch: Great vessel origins are patent. Right carotid system: Patent. Probable mild-to-moderate stenosis of the mid right ICA. Left carotid system: Patent.  No evidence of high-grade stenosis. Vertebral arteries: Patent.  No evidence of high-grade stenosis. IMPRESSION: MRI: 1. No evidence of acute intracranial abnormality on this motion limited exam. Specifically, no acute infarct. 2. 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 3. Prior microhemorrhages in the right thalamus and left cerebellum, potentially hypertensive given location and the patient's known history of hypertension. MRA Head: 1. No evidence of large vessel occlusion. 2. Moderate to severely motion degraded study. This precludes adequate evaluation for, and accurate quantification of, intracranial arterial stenoses. This also precludes adequateevaluation for intracranial aneurysms. A CTA may be able to better characterize if clinically indicated. MRA Neck: 1. Motion limited study with patent major arteries in the neck 2. Probable mild-to-moderate stenosis of the mid right cervical ICA. Electronically Signed   By: Margaretha Sheffield M.D.   On: 11/22/2020 12:34   MR ANGIO NECK W WO CONTRAST  Result Date: 11/22/2020 CLINICAL DATA:  cva; Stroke, follow up EXAM: MRI HEAD WITHOUT CONTRAST MRA HEAD WITHOUT CONTRAST MRA NECK WITHOUT AND WITH CONTRAST TECHNIQUE: Multiplanar, multi-echo pulse sequences of the brain and surrounding structures were acquired without intravenous contrast. Angiographic images of the Circle of Willis were acquired using MRA technique without intravenous contrast. Angiographic images of the neck were acquired using MRA technique without and with intravenous  contrast. Carotid stenosis measurements (when applicable) are obtained utilizing NASCET criteria, using the distal internal carotid diameter as the denominator. CONTRAST:  6.26mL GADAVIST GADOBUTROL 1 MMOL/ML IV SOLN COMPARISON:  CT 11/21/2020. FINDINGS: MRI HEAD FINDINGS Motion limited study.  Within this limitation: Brain: No acute infarction, hemorrhage, hydrocephalus, extra-axial collection or mass lesion. No pathologic intracranial enhancement. Moderate scattered T2 hyperintensities within the supraventricular and pontine white matter, nonspecific but most likely secondary to chronic microvascular ischemic disease given the patient's known risk factors (including diabetes and hypertension). 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 Small focus of susceptibility artifact within the left cerebellum and right thalamus, likely the sequela of prior microhemorrhage. Vascular: See below. Skull and upper cervical spine: Normal marrow signal. Sinuses/Orbits: Clear sinuses.  Unremarkable orbits. Other: Small bilateral mastoid effusions. MRA HEAD FINDINGS Moderate to severely motion degraded study. This precludes adequate evaluation for, and accurate quantification of, intracranial arterial stenoses. This also precludes adequateevaluation for intracranial aneurysms. Anterior circulation: Bilateral intracranial ICAs are patent. Bilateral M1 MCAs are patent. Limited evaluation of the more distal MCA branches with some vascular flow related signal bilaterally. Bilateral A1 ACAs appear to be grossly patent. A2 ACA evaluation is essentially nondiagnostic due to extensive motion. Posterior circulation: Bilateral intradural vertebral arteries, basilar artery and proximal posterior cerebral arteries are patent. MRA NECK FINDINGS Motion limited study. Aortic arch:  Great vessel origins are patent. Right carotid system: Patent. Probable mild-to-moderate stenosis of the mid right  ICA. Left carotid system: Patent.  No evidence of high-grade stenosis. Vertebral arteries: Patent.  No evidence of high-grade stenosis. IMPRESSION: MRI: 1. No evidence of acute intracranial abnormality on this motion limited exam. Specifically, no acute infarct. 2. 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 3. Prior microhemorrhages in the right thalamus and left cerebellum, potentially hypertensive given location and the patient's known history of hypertension. MRA Head: 1. No evidence of large vessel occlusion. 2. Moderate to severely motion degraded study. This precludes adequate evaluation for, and accurate quantification of, intracranial arterial stenoses. This also precludes adequateevaluation for intracranial aneurysms. A CTA may be able to better characterize if clinically indicated. MRA Neck: 1. Motion limited study with patent major arteries in the neck 2. Probable mild-to-moderate stenosis of the mid right cervical ICA. Electronically Signed   By: Margaretha Sheffield M.D.   On: 11/22/2020 12:34   MR BRAIN WO CONTRAST  Result Date: 11/22/2020 CLINICAL DATA:  cva; Stroke, follow up EXAM: MRI HEAD WITHOUT CONTRAST MRA HEAD WITHOUT CONTRAST MRA NECK WITHOUT AND WITH CONTRAST TECHNIQUE: Multiplanar, multi-echo pulse sequences of the brain and surrounding structures were acquired without intravenous contrast. Angiographic images of the Circle of Willis were acquired using MRA technique without intravenous contrast. Angiographic images of the neck were acquired using MRA technique without and with intravenous contrast. Carotid stenosis measurements (when applicable) are obtained utilizing NASCET criteria, using the distal internal carotid diameter as the denominator. CONTRAST:  6.43mL GADAVIST GADOBUTROL 1 MMOL/ML IV SOLN COMPARISON:  CT 11/21/2020. FINDINGS: MRI HEAD FINDINGS Motion limited study.  Within  this limitation: Brain: No acute infarction, hemorrhage, hydrocephalus, extra-axial collection or mass lesion. No pathologic intracranial enhancement. Moderate scattered T2 hyperintensities within the supraventricular and pontine white matter, nonspecific but most likely secondary to chronic microvascular ischemic disease given the patient's known risk factors (including diabetes and hypertension). 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 Small focus of susceptibility artifact within the left cerebellum and right thalamus, likely the sequela of prior microhemorrhage. Vascular: See below. Skull and upper cervical spine: Normal marrow signal. Sinuses/Orbits: Clear sinuses.  Unremarkable orbits. Other: Small bilateral mastoid effusions. MRA HEAD FINDINGS Moderate to severely motion degraded study. This precludes adequate evaluation for, and accurate quantification of, intracranial arterial stenoses. This also precludes adequateevaluation for intracranial aneurysms. Anterior circulation: Bilateral intracranial ICAs are patent. Bilateral M1 MCAs are patent. Limited evaluation of the more distal MCA branches with some vascular flow related signal bilaterally. Bilateral A1 ACAs appear to be grossly patent. A2 ACA evaluation is essentially nondiagnostic due to extensive motion. Posterior circulation: Bilateral intradural vertebral arteries, basilar artery and proximal posterior cerebral arteries are patent. MRA NECK FINDINGS Motion limited study. Aortic arch: Great vessel origins are patent. Right carotid system: Patent. Probable mild-to-moderate stenosis of the mid right ICA. Left carotid system: Patent.  No evidence of high-grade stenosis. Vertebral arteries: Patent.  No evidence of high-grade stenosis. IMPRESSION: MRI: 1. No evidence of acute intracranial abnormality on this motion limited exam. Specifically, no acute infarct. 2. Moderate chronic microvascular  ischemic disease with small remote infarcts in the right basal ganglia, left frontal white matter and left cerebellum. 3. Prior microhemorrhages in the right thalamus and left cerebellum, potentially hypertensive given location and the patient's known history of hypertension. MRA Head:  1. No evidence of large vessel occlusion. 2. Moderate to severely motion degraded study. This precludes adequate evaluation for, and accurate quantification of, intracranial arterial stenoses. This also precludes adequateevaluation for intracranial aneurysms. A CTA may be able to better characterize if clinically indicated. MRA Neck: 1. Motion limited study with patent major arteries in the neck 2. Probable mild-to-moderate stenosis of the mid right cervical ICA. Electronically Signed   By: Margaretha Sheffield M.D.   On: 11/22/2020 12:34   MR BRAIN W CONTRAST  Result Date: 11/23/2020 CLINICAL DATA:  Initial evaluation for seizure, abnormal neuro exam. EXAM: MRI HEAD WITH CONTRAST TECHNIQUE: Multiplanar, multiecho pulse sequences of the brain and surrounding structures were obtained with intravenous contrast. CONTRAST:  7.39mL GADAVIST GADOBUTROL 1 MMOL/ML IV SOLN COMPARISON:  Previous noncontrast MRIs from 11/22/2020. FINDINGS: Examination is degraded by motion artifact. No abnormal or pathologic enhancement seen within the brain following contrast administration. The remainder of the examination is otherwise unchanged. IMPRESSION: 1. Somewhat limited exam due to motion artifact. 2. No abnormal or pathologic enhancement within the brain following contrast administration. Electronically Signed   By: Jeannine Boga M.D.   On: 11/23/2020 03:26   US RENAL  Result Date: 12/08/2020 CLINICAL DATA:  Acute renal injury. EXAM: RENAL / URINARY TRACT ULTRASOUND COMPLETE COMPARISON:  None. FINDINGS: Right Kidney: Renal measurements: 9.6 x 4.9 x 4.9 cm = volume: 120.9 mL. Normal renal cortical thickness. Increased echogenicity could  suggest medical renal disease. No renal lesions or hydronephrosis. Left Kidney: Renal measurements: 11.4 x 5.5 x 5.4 cm = volume: 176.6 mL. Mild increased echogenicity but normal renal cortical thickness. No renal lesions or hydronephrosis. Bladder: Normal Other: None. IMPRESSION: 1. Increased echogenicity of the kidneys suggesting medical renal disease. 2. No renal lesions or hydronephrosis. Electronically Signed   By: Marijo Sanes M.D.   On: 12/08/2020 15:47   DG Pelvis Portable  Result Date: 11/22/2020 CLINICAL DATA:  Pre MRI screening. EXAM: PORTABLE PELVIS 1-2 VIEWS COMPARISON:  09/26/2020 FINDINGS: Central pelvic calcifications compatible with small degenerated fibroids. Lower lumbar spine degenerative changes. Atheromatous arterial calcifications. No metallic foreign bodies seen. IMPRESSION: No acute abnormality.  No metallic foreign bodies. Electronically Signed   By: Claudie Revering M.D.   On: 11/22/2020 09:41   DG Chest Portable 1 View  Result Date: 12/08/2020 CLINICAL DATA:  Cough, altered mental status, diabetes mellitus, hypertension EXAM: PORTABLE CHEST 1 VIEW COMPARISON:  Portable exam 0758 hours compared to 11/22/2020 FINDINGS: Normal heart size, mediastinal contours, and pul Aortic Atherosclerosis (ICD10-I70.0). monary vascularity. Atherosclerotic calcification aorta. Lungs clear. No pulmonary infiltrate, pleural effusion, or pneumothorax. Bones demineralized with degenerative changes thoracic spine. IMPRESSION: No acute abnormalities. Electronically Signed   By: Lavonia Dana M.D.   On: 12/08/2020 08:27   DG CHEST PORT 1 VIEW  Result Date: 11/22/2020 CLINICAL DATA:  Pre MRI screening procedure. EXAM: PORTABLE CHEST 1 VIEW COMPARISON:  09/26/2020 FINDINGS: Poor inspiration. Normal sized heart. Clear lungs. Thoracic spine degenerative changes. No metallic foreign bodies. IMPRESSION: No acute abnormality.  No metallic foreign bodies. Electronically Signed   By: Claudie Revering M.D.   On:  11/22/2020 09:39   DG Abd Portable 1V  Result Date: 11/22/2020 CLINICAL DATA:  Pre MRI screening procedure. EXAM: PORTABLE ABDOMEN - 1 VIEW COMPARISON:  Portable pelvis dated 09/26/2020 FINDINGS: Normal bowel gas pattern. Central pelvic calcifications compatible with a small degenerated uterine fibroids. Lumbar and lower thoracic spine degenerative changes. No metallic foreign bodies are seen. IMPRESSION: No acute abnormality.  No  metallic foreign bodies. Electronically Signed   By: Claudie Revering M.D.   On: 11/22/2020 09:36   EEG adult  Result Date: 11/22/2020 Lora Havens, MD     11/22/2020  5:13 PM Patient Name: SAMARIYA ROCKHOLD MRN: [id number] Epilepsy Attending: Lora Havens Referring Physician/Provider: Dr Shanda Howells Date: 11/22/2020 Duration: 22.35 mins Patient history: 64yo f with left-sided weakness/seizure-like activity. EEG to evaluate for seizure Level of alertness: Awake AEDs during EEG study: None Technical aspects: This EEG study was done with scalp electrodes positioned according to the 10-20 International system of electrode placement. Electrical activity was acquired at a sampling rate of $Remov'500Hz'NOMzPB$  and reviewed with a high frequency filter of $RemoveB'70Hz'KCAhWrVi$  and a low frequency filter of $RemoveB'1Hz'eVFPHUIj$ . EEG data were recorded continuously and digitally stored. Description: The posterior dominant rhythm consists of 8-9 Hz activity of moderate voltage (25-35 uV) seen predominantly in posterior head regions, symmetric and reactive to eye opening and eye closing. Physiologic photic driving was not seen during photic stimulation.  Hyperventilation was not performed.   Of note, this study was technically difficult due to significant movement and myogenic artifact. IMPRESSION: This technically difficult study is within normal limits. No seizures or epileptiform discharges were seen throughout the recording. If suspicion for interictal activity remains a concern, a repeat study can be considered. Priyanka Barbra Sarks    Overnight EEG with video  Result Date: 11/25/2020 Lora Havens, MD     11/25/2020  9:10 AM Patient Name: AZRAEL HUSS MRN: [id number] Epilepsy Attending: Lora Havens Referring Physician/Provider: Clance Boll, NP Duration: 11/24/2020 1037 to 1640  Patient history: 65yo f with left-sided weakness/seizure-like activity. EEG to evaluate for seizure  Level of alertness: Awake  AEDs during EEG study: None  Technical aspects: This EEG study was done with scalp electrodes positioned according to the 10-20 International system of electrode placement. Electrical activity was acquired at a sampling rate of $Remov'500Hz'gXZnHm$  and reviewed with a high frequency filter of $RemoveB'70Hz'gVTFKmkJ$  and a low frequency filter of $RemoveB'1Hz'tNLkckcP$ . EEG data were recorded continuously and digitally stored.  Description: The posterior dominant rhythm consists of 8-9 Hz activity of moderate voltage (25-35 uV) seen predominantly in posterior head regions, symmetric and reactive to eye opening and eye closing. Photic driving and  hyperventilation was not performed.    Of note, this study was technically difficult due to significant electrode and movement artifact.  IMPRESSION: This technically difficult study is within normal limits. No seizures or epileptiform discharges were seen throughout the recording.  Lora Havens   ECHOCARDIOGRAM COMPLETE  Result Date: 11/22/2020    ECHOCARDIOGRAM REPORT   Patient Name:   BLASA RAISCH Date of Exam: 11/22/2020 Medical Rec #:  [id number]    Height:       63.0 in Accession #:    [id number]   Weight:       153.0 lb Date of Birth:  Jan 14, 1956   BSA:          1.726 m Patient Age:    19 years     BP:           140/72 mmHg Patient Gender: F            HR:           62 bpm. Exam Location:  Inpatient Procedure: 2D Echo, Cardiac Doppler and Color Doppler Indications:    TIA G45.9  History:        Patient has no prior history of Echocardiogram  examinations.                 Stroke; Risk Factors:Diabetes, Hypertension and ETOH.   Sonographer:    Bernadene Person RDCS Referring Phys: [place]  1. Left ventricular ejection fraction, by estimation, is 60 to 65%. The left ventricle has normal function. The left ventricle has no regional wall motion abnormalities. Left ventricular diastolic parameters are consistent with Grade I diastolic dysfunction (impaired relaxation).  2. Right ventricular systolic function is normal. The right ventricular size is normal. There is normal pulmonary artery systolic pressure.  3. The mitral valve is normal in structure. Trivial mitral valve regurgitation. No evidence of mitral stenosis.  4. The aortic valve is tricuspid. Aortic valve regurgitation is trivial. Mild aortic valve sclerosis is present, with no evidence of aortic valve stenosis.  5. The inferior vena cava is normal in size with greater than 50% respiratory variability, suggesting right atrial pressure of 3 mmHg. FINDINGS  Left Ventricle: Left ventricular ejection fraction, by estimation, is 60 to 65%. The left ventricle has normal function. The left ventricle has no regional wall motion abnormalities. The left ventricular internal cavity size was normal in size. There is  no left ventricular hypertrophy. Left ventricular diastolic parameters are consistent with Grade I diastolic dysfunction (impaired relaxation). Right Ventricle: The right ventricular size is normal. Right ventricular systolic function is normal. There is normal pulmonary artery systolic pressure. The tricuspid regurgitant velocity is 2.23 m/s, and with an assumed right atrial pressure of 3 mmHg,  the estimated right ventricular systolic pressure is 16.1 mmHg. Left Atrium: Left atrial size was normal in size. Right Atrium: Right atrial size was normal in size. Pericardium: There is no evidence of pericardial effusion. Mitral Valve: The mitral valve is normal in structure. Trivial mitral valve regurgitation. No evidence of mitral valve stenosis. Tricuspid Valve:  The tricuspid valve is normal in structure. Tricuspid valve regurgitation is trivial. No evidence of tricuspid stenosis. Aortic Valve: The aortic valve is tricuspid. Aortic valve regurgitation is trivial. Aortic regurgitation PHT measures 635 msec. Mild aortic valve sclerosis is present, with no evidence of aortic valve stenosis. Pulmonic Valve: The pulmonic valve was normal in structure. Pulmonic valve regurgitation is not visualized. No evidence of pulmonic stenosis. Aorta: The aortic root is normal in size and structure. Venous: The inferior vena cava is normal in size with greater than 50% respiratory variability, suggesting right atrial pressure of 3 mmHg. IAS/Shunts: No atrial level shunt detected by color flow Doppler.  LEFT VENTRICLE PLAX 2D LVIDd:         3.90 cm  Diastology LVIDs:         2.20 cm  LV e' medial:    5.78 cm/s LV PW:         0.90 cm  LV E/e' medial:  14.9 LV IVS:        1.10 cm  LV e' lateral:   12.60 cm/s LVOT diam:     1.80 cm  LV E/e' lateral: 6.8 LV SV:         61 LV SV Index:   35 LVOT Area:     2.54 cm  RIGHT VENTRICLE RV S prime:     10.30 cm/s TAPSE (M-mode): 1.3 cm LEFT ATRIUM           Index       RIGHT ATRIUM          Index LA diam:      3.20 cm  1.85 cm/m  RA Area:     8.69 cm LA Vol (A2C): 29.5 ml 17.10 ml/m RA Volume:   13.00 ml 7.53 ml/m LA Vol (A4C): 33.5 ml 19.41 ml/m  AORTIC VALVE LVOT Vmax:   121.00 cm/s LVOT Vmean:  80.400 cm/s LVOT VTI:    0.239 m AI PHT:      635 msec  AORTA Ao Root diam: 2.70 cm Ao Asc diam:  3.30 cm MITRAL VALVE               TRICUSPID VALVE MV Area (PHT): 4.15 cm    TR Peak grad:   19.9 mmHg MV Decel Time: 183 msec    TR Vmax:        223.00 cm/s MV E velocity: 86.00 cm/s MV A velocity: 85.00 cm/s  SHUNTS MV E/A ratio:  1.01        Systemic VTI:  0.24 m                            Systemic Diam: 1.80 cm Kirk Ruths MD Electronically signed by Kirk Ruths MD Signature Date/Time: 11/22/2020/11:37:06 AM    Final    CT HEAD CODE STROKE WO  CONTRAST  Result Date: 11/21/2020 CLINICAL DATA:  Code stroke.  Neuro deficit, acute, stroke suspected EXAM: CT HEAD WITHOUT CONTRAST TECHNIQUE: Contiguous axial images were obtained from the base of the skull through the vertex without intravenous contrast. COMPARISON:  CT head 09/26/2020. FINDINGS: Brain: No evidence of acute large vascular territory infarction, hemorrhage, hydrocephalus, extra-axial collection or mass lesion/mass effect. Similar patchy white matter hypoattenuation, compatible with chronic microvascular ischemic disease. Probable remote lacunar infarcts in bilateral basal ganglia and right corona radiata, similar to priors Vascular: No hyperdense vessel identified. Skull: No acute fracture. Sinuses/Orbits: Clear sinuses.  Unremarkable orbits. Other: No mastoid effusions ASPECTS [facility] Stroke Program Early CT Score) total score (0-10 with 10 being normal): 10. IMPRESSION: 1. No evidence of acute large vascular territory infarct or acute hemorrhage. 2. ASPECTS is 10 3. 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 4. Partially empty sella, which is often a normal anatomic variant but can be associated with idiopathic intracranial hypertension. Findings discussed with Dr. Rocky Crafts via telephone at 3:20 p.m. Electronically Signed   By: Margaretha Sheffield M.D.   On: 11/21/2020 15:32    Microbiology: Recent Results (from the past 240 hour(s))  Urine Culture     Status: None   Collection Time: 12/08/20 10:00 AM   Specimen: In/Out Cath Urine  Result Value Ref Range Status   Specimen Description   Final    IN/OUT CATH URINE Performed at [facility], [address] [place], [place] [address]    Special Requests   Final    NONE Performed at [facility], [address] [place], [place] [address]    Culture   Final    NO GROWTH Performed at [facility], [place] [address], [place],  [place] [address]    Report Status 12/09/2020 FINAL  Final  Resp Panel by RT-PCR (Flu A&B, Covid) Nasopharyngeal Swab     Status: None   Collection Time: 12/08/20 12:00 PM   Specimen: Nasopharyngeal Swab; Nasopharyngeal(NP) swabs in vial transport medium  Result Value Ref Range Status   SARS Coronavirus 2 by RT PCR NEGATIVE NEGATIVE Final    Comment: (NOTE) SARS-CoV-2 target nucleic acids are NOT DETECTED.  The SARS-CoV-2 RNA is generally detectable in upper respiratory specimens during the acute phase of infection. The lowest concentration of SARS-CoV-2 viral copies this assay can detect is 138 copies/mL. A negative result does not preclude SARS-Cov-2 infection and should not be used as the sole basis for treatment or other patient management decisions. A negative result may occur with  improper specimen collection/handling, submission of specimen other than nasopharyngeal swab, presence of viral mutation(s) within the areas targeted by this assay, and inadequate number of viral copies(<138 copies/mL). A negative result must be combined with clinical observations, patient history, and epidemiological information. The expected result is Negative.  Fact Sheet for Patients:  EntrepreneurPulse.com.au  Fact Sheet for Healthcare Providers:  IncredibleEmployment.be  This test is no t yet approved or cleared by the [place] FDA and  has been authorized for detection and/or diagnosis of SARS-CoV-2 by FDA under an Emergency Use Authorization (EUA). This EUA will remain  in effect (meaning this test can be used) for the duration of the COVID-19 declaration under Section 564(b)(1) of the Act, 21 U.S.C.section 360bbb-3(b)(1), unless the authorization is terminated  or revoked sooner.       Influenza A by PCR NEGATIVE NEGATIVE Final   Influenza B by PCR NEGATIVE NEGATIVE Final    Comment: (NOTE) The Xpert Xpress SARS-CoV-2/FLU/RSV plus assay is intended as  an aid in the diagnosis of influenza from Nasopharyngeal swab specimens and should not be used as a sole basis for treatment. Nasal washings and aspirates are unacceptable for Xpert Xpress SARS-CoV-2/FLU/RSV testing.  Fact Sheet for Patients: EntrepreneurPulse.com.au  Fact Sheet for Healthcare Providers: IncredibleEmployment.be  This test is not yet approved or cleared by the [place] FDA and has been authorized for detection and/or diagnosis of SARS-CoV-2 by FDA under an Emergency Use Authorization (EUA). This EUA will remain in effect (meaning this test can be used) for the duration of the COVID-19 declaration under Section 564(b)(1) of the Act, 21 U.S.C. section 360bbb-3(b)(1), unless the authorization is terminated or revoked.  Performed at [facility], [address], [place], [place] [address]   MRSA Next Gen by PCR, Nasal     Status: Abnormal   Collection Time: 12/08/20  8:00 PM   Specimen: Nasal Mucosa; Nasal Swab  Result Value Ref Range Status   MRSA by PCR Next Gen DETECTED (A) NOT DETECTED Final    Comment: RESULT CALLED TO, READ BACK BY AND VERIFIED WITH: KINDLEY,[email or web address]  BY MATTHEWS,B  9.27.22 (NOTE) The GeneXpert MRSA Assay (FDA approved for NASAL specimens only), is one component of a comprehensive MRSA colonization surveillance program. It is not intended to diagnose MRSA infection nor to guide or monitor treatment for MRSA infections. Test performance is not FDA approved in patients less than 31 years old. Performed at [facility], [address] [place], [place] [address]   SARS CORONAVIRUS 2 (TAT 6-24 HRS) Nasopharyngeal Nasopharyngeal Swab     Status: None   Collection Time: 12/12/20  3:37 PM   Specimen: Nasopharyngeal Swab  Result Value Ref Range Status   SARS Coronavirus 2 NEGATIVE NEGATIVE Final    Comment: (NOTE) SARS-CoV-2 target nucleic acids are NOT DETECTED.  The SARS-CoV-2 RNA is generally detectable in  upper and lower respiratory specimens during the acute phase of infection. Negative results do not preclude SARS-CoV-2 infection, do not rule out co-infections with other pathogens, and should not be used as the sole basis for treatment or other patient management decisions. Negative results must be combined with clinical observations,  patient history, and epidemiological information. The expected result is Negative.  Fact Sheet for Patients: SugarRoll.be  Fact Sheet for Healthcare Providers: https://www.woods-mathews.com/  This test is not yet approved or cleared by the [place] FDA and  has been authorized for detection and/or diagnosis of SARS-CoV-2 by FDA under an Emergency Use Authorization (EUA). This EUA will remain  in effect (meaning this test can be used) for the duration of the COVID-19 declaration under Se ction 564(b)(1) of the Act, 21 U.S.C. section 360bbb-3(b)(1), unless the authorization is terminated or revoked sooner.  Performed at [facility], [place] [address] [place], [place] [address]      Labs: Basic Metabolic Panel: Recent Labs  Lab 12/08/20 1800 12/09/20 0243 12/10/20 0444 12/11/20 0443 12/13/20 0415  NA 155* 156* 157* 153* 142  K 4.1 3.3* 4.0 3.3* 2.8*  CL 121* 115* 121* 117* 111  CO2 20* $Remov'28 26 26 22  'wfHeeN$ GLUCOSE 252* 266* 151* 135* 88  BUN 128* 120* 63* 29* 9  CREATININE 3.54* 2.62* 1.36* 0.94 0.64  CALCIUM 10.1 9.2 9.0 9.2 8.6*  MG  --  2.4  --   --   --    Liver Function Tests: Recent Labs  Lab 12/08/20 1135 12/09/20 0243  AST 27 15  ALT 9 9  ALKPHOS 107 88  BILITOT 0.6 0.3  PROT 9.0* 7.3  ALBUMIN 4.1 3.4*   Recent Labs  Lab 12/08/20 1135  LIPASE 114*   Recent Labs  Lab 12/08/20 1135  AMMONIA 18   CBC: Recent Labs  Lab 12/08/20 1135 12/09/20 0243  WBC 17.2* 13.4*  NEUTROABS 12.5*  --   HGB 13.6 11.1*  HCT 45.9 36.5  MCV 83.3 80.6  PLT 272 211   CBG: Recent Labs   Lab 12/12/20 1618 12/12/20 2201 12/13/20 0551 12/13/20 0729 12/13/20 1129  GLUCAP 88 120* 80 86 117*    Signed:  Barton Dubois MD.  [facility] 12/13/2020, 11:47 AM

## 2020-12-14 LAB — GLUCOSE, CAPILLARY
Glucose-Capillary: 129 mg/dL — ABNORMAL HIGH (ref 70–99)
Glucose-Capillary: 157 mg/dL — ABNORMAL HIGH (ref 70–99)

## 2020-12-14 NOTE — Progress Notes (Signed)
Chart reviewed.  Patient is hemodynamically stable and medically ready for discharge to SNF. Patient not transferred on 12/13/20 as intended due to lack of transportation. Please refer to discharge summary written on 12/13/20 for further info/details.  Vassie Loll MD [phone]

## 2020-12-14 NOTE — Progress Notes (Signed)
Attempted to call report to [facility] in [place] and after initial answer was put on hold for 10 minutes with no one picking up.

## 2021-01-25 ENCOUNTER — Non-Acute Institutional Stay: Payer: Medicare Other | Admitting: Primary Care

## 2021-01-25 ENCOUNTER — Encounter: Payer: Self-pay | Admitting: Primary Care

## 2021-01-25 ENCOUNTER — Other Ambulatory Visit: Payer: Self-pay

## 2021-01-25 DIAGNOSIS — Z8659 Personal history of other mental and behavioral disorders: Secondary | ICD-10-CM

## 2021-01-25 DIAGNOSIS — Z515 Encounter for palliative care: Secondary | ICD-10-CM

## 2021-01-25 DIAGNOSIS — R410 Disorientation, unspecified: Secondary | ICD-10-CM

## 2021-01-25 DIAGNOSIS — R413 Other amnesia: Secondary | ICD-10-CM
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
## 2021-01-29 ENCOUNTER — Telehealth: Payer: Self-pay

## 2021-01-29 NOTE — Telephone Encounter (Signed)
01/29/21 @10 :30 AM: PC SW outreached patients daughter, , per [facility] NP - [facility], request to discuss needs and questions about guardianship.  Call unsuccessful. SW left HIPPA complaint VM with contact info. Awaiting return call.

## 2021-02-05 ENCOUNTER — Emergency Department (HOSPITAL_COMMUNITY): Payer: Medicare Other

## 2021-02-05 ENCOUNTER — Emergency Department (HOSPITAL_COMMUNITY)
Admission: EM | Admit: 2021-02-05 | Discharge: 2021-02-06 | Disposition: A | Discharge status: Home / Self Care | Payer: Medicare Other | Attending: Emergency Medicine | Admitting: Emergency Medicine

## 2021-02-05 ENCOUNTER — Other Ambulatory Visit: Payer: Self-pay

## 2021-02-05 ENCOUNTER — Encounter (HOSPITAL_COMMUNITY): Payer: Self-pay | Admitting: Emergency Medicine

## 2021-02-05 DIAGNOSIS — E785 Hyperlipidemia, unspecified: Secondary | ICD-10-CM | POA: Insufficient documentation

## 2021-02-05 DIAGNOSIS — W050XXA Fall from non-moving wheelchair, initial encounter: Secondary | ICD-10-CM | POA: Insufficient documentation

## 2021-02-05 DIAGNOSIS — G20 Parkinson's disease: Secondary | ICD-10-CM | POA: Insufficient documentation

## 2021-02-05 DIAGNOSIS — Z951 Presence of aortocoronary bypass graft: Secondary | ICD-10-CM | POA: Diagnosis not present

## 2021-02-05 DIAGNOSIS — I10 Essential (primary) hypertension: Secondary | ICD-10-CM | POA: Insufficient documentation

## 2021-02-05 DIAGNOSIS — S0990XA Unspecified injury of head, initial encounter: Secondary | ICD-10-CM

## 2021-02-05 DIAGNOSIS — Z7982 Long term (current) use of aspirin: Secondary | ICD-10-CM | POA: Insufficient documentation

## 2021-02-05 DIAGNOSIS — I2510 Atherosclerotic heart disease of native coronary artery without angina pectoris: Secondary | ICD-10-CM | POA: Insufficient documentation

## 2021-02-05 DIAGNOSIS — Z79899 Other long term (current) drug therapy: Secondary | ICD-10-CM | POA: Diagnosis not present

## 2021-02-05 DIAGNOSIS — S0083XA Contusion of other part of head, initial encounter: Secondary | ICD-10-CM | POA: Diagnosis not present

## 2021-02-05 DIAGNOSIS — F0390 Unspecified dementia without behavioral disturbance: Secondary | ICD-10-CM | POA: Diagnosis not present

## 2021-02-05 DIAGNOSIS — E1169 Type 2 diabetes mellitus with other specified complication: Secondary | ICD-10-CM | POA: Insufficient documentation

## 2021-02-05 DIAGNOSIS — W19XXXA Unspecified fall, initial encounter: Secondary | ICD-10-CM

## 2021-02-05 DIAGNOSIS — Z7984 Long term (current) use of oral hypoglycemic drugs: Secondary | ICD-10-CM | POA: Insufficient documentation

## 2021-02-05 HISTORY — DX: Hyperlipidemia, unspecified: E78.5

## 2021-02-05 LAB — URINALYSIS, ROUTINE W REFLEX MICROSCOPIC
Bilirubin Urine: NEGATIVE
Glucose, UA: NEGATIVE mg/dL
Hgb urine dipstick: NEGATIVE
Ketones, ur: NEGATIVE mg/dL
Leukocytes,Ua: NEGATIVE
Nitrite: NEGATIVE
Protein, ur: NEGATIVE mg/dL
Specific Gravity, Urine: 1.025 (ref 1.005–1.030)
pH: 6 (ref 5.0–8.0)

## 2021-02-05 IMAGING — CT CT CERVICAL SPINE W/O CM
3 of 4 series · 12 of 33 positions shown, 14 images · non-contrast
Comparison: [DATE]

CLINICAL DATA: Status post fall.

EXAM:
CT CERVICAL SPINE WITHOUT CONTRAST
TECHNIQUE: Multidetector CT imaging of the cervical spine was performed without
intravenous contrast. Multiplanar CT image reconstructions were also
generated.

[Series 5: sag bone · sagittal · 0.25mm/px · 5 of 61 slices shown, 6 images]
[im 21/61  bone]
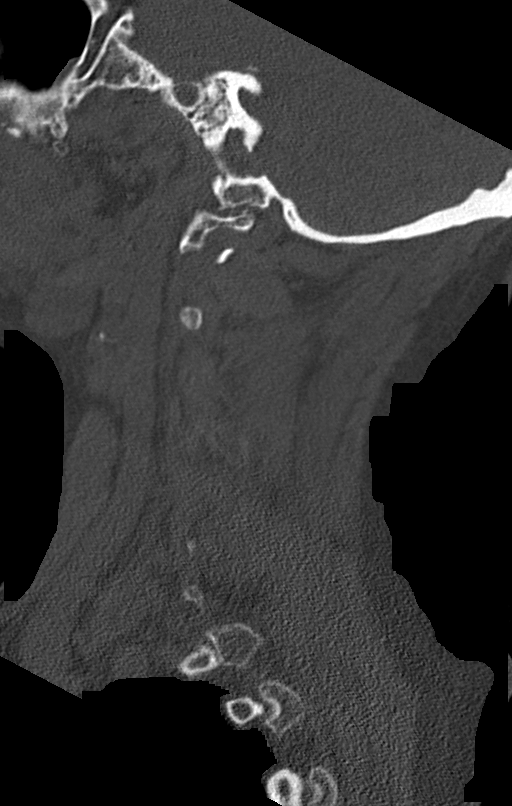
[im 26/61  bone]
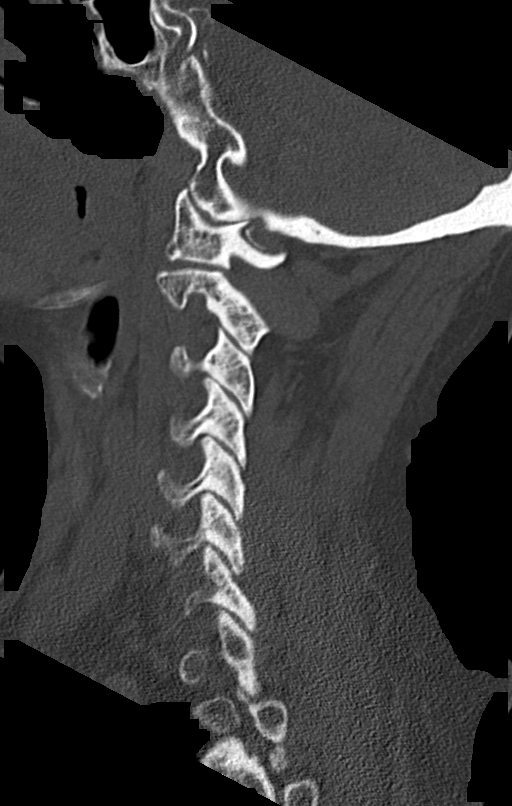
[im 31/61  soft-tissue]
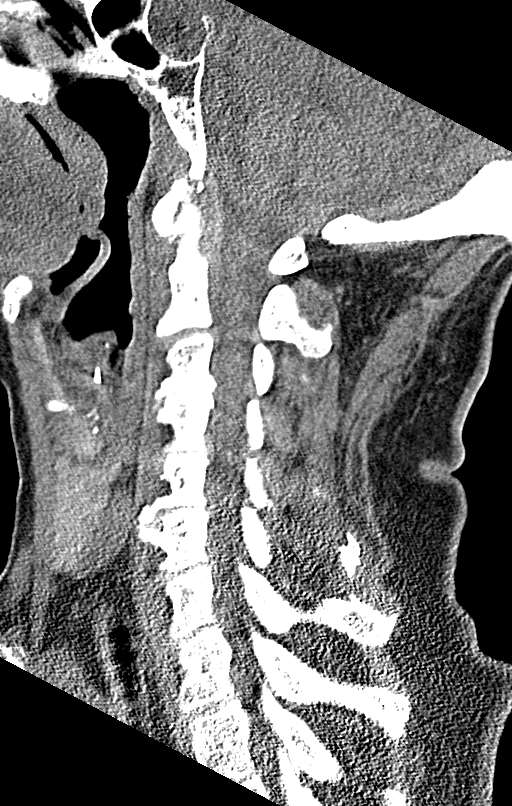
[im 31/61  bone]
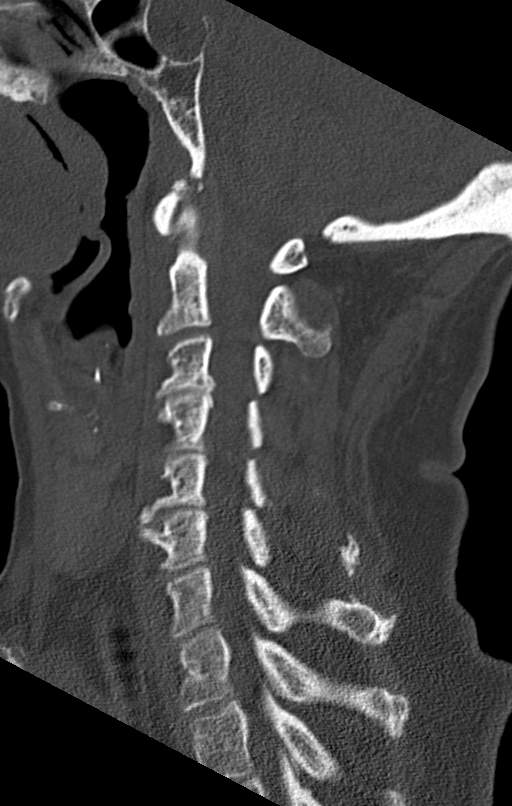
[im 36/61  bone]
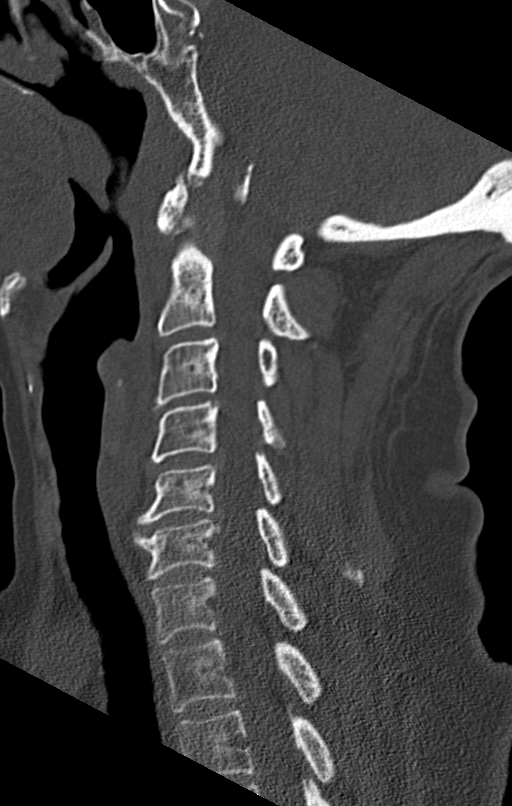
[im 41/61  bone]
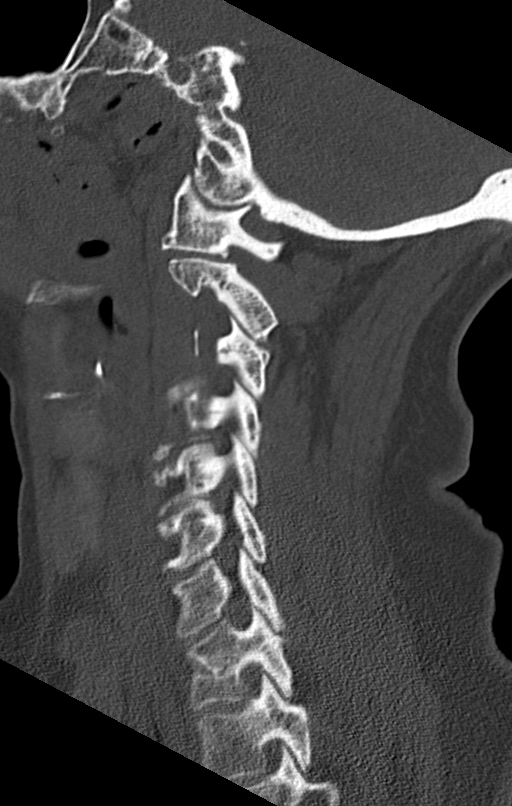

[Series 6: cor bone · coronal · 0.25mm/px · 3 of 61 slices shown]
[im 15/61  bone]
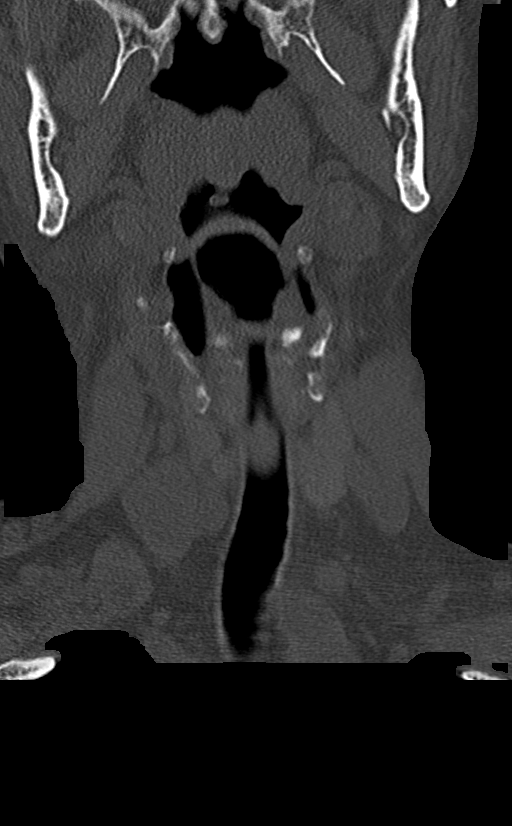
[im 25/61  bone]
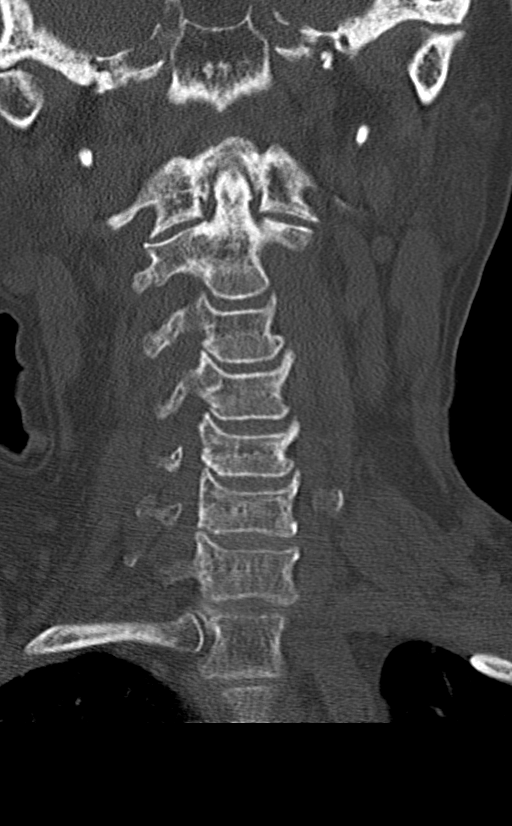
[im 36/61  bone]
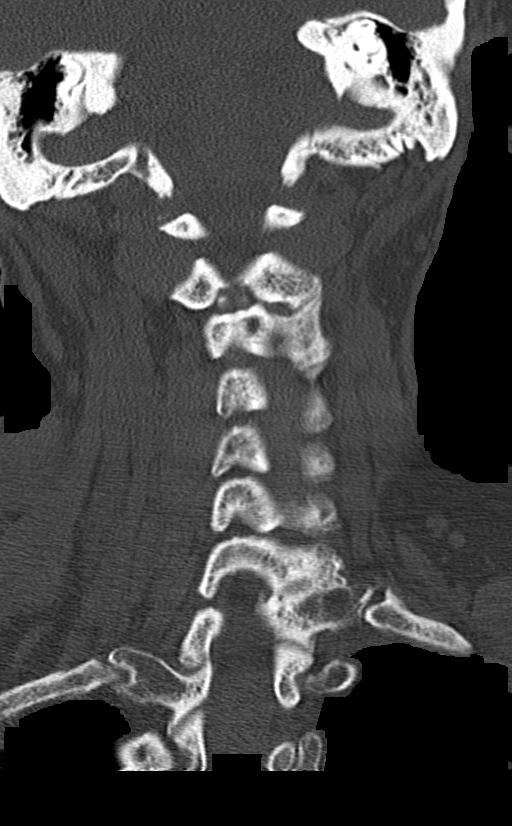

[Series 7: orthogonal axials · axial · 0.21mm/px · z∈[-238,-177]mm · 4 of 75 slices shown, 5 images]
[im 15/75  soft-tissue]
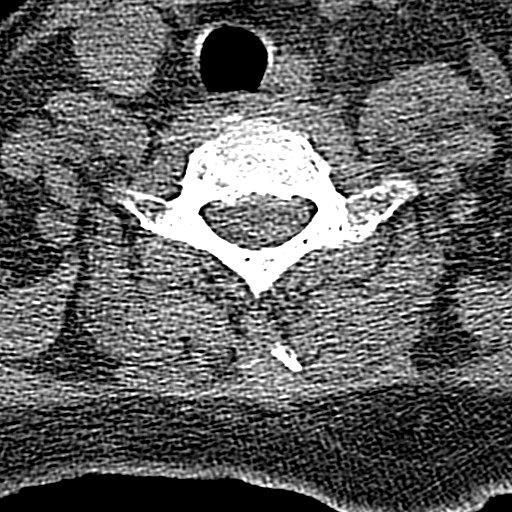
[im 15/75  bone]
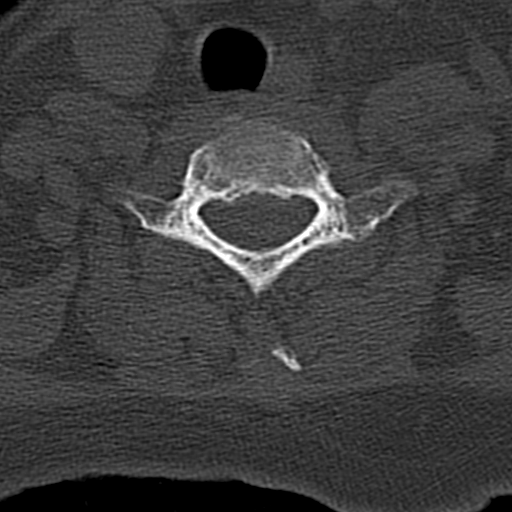
[im 30/75  bone]
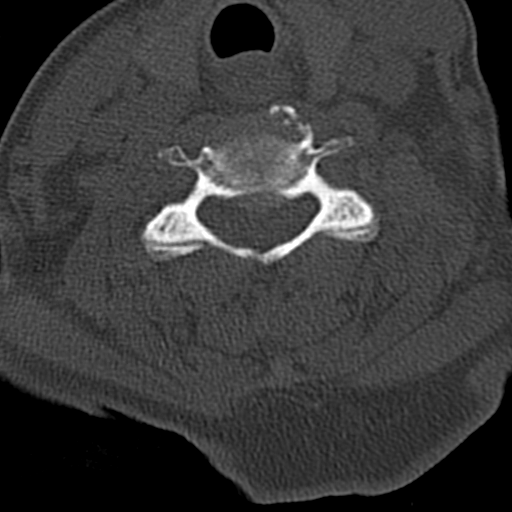
[im 45/75  bone]
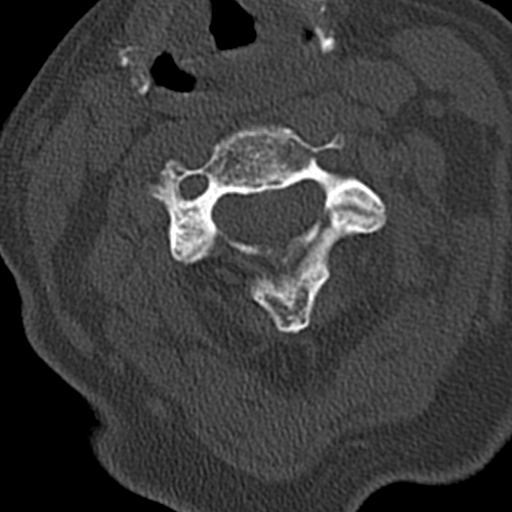
[im 60/75  bone]
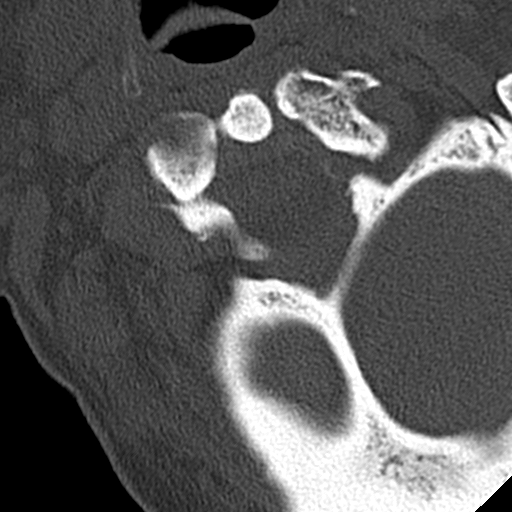

[12 of 33 positions shown; findings below may reference images not displayed]

FINDINGS: Alignment: Normal.

Skull base and vertebrae: No acute fracture. No primary bone lesion
or focal pathologic process.

Soft tissues and spinal canal: No prevertebral fluid or swelling. No
visible canal hematoma.

Disc levels: Mild endplate sclerosis and moderate to marked severity
osteophyte formation are seen at the levels of C4-C5 and C5-C6. Mild
osteophyte formation is seen at the level of C3-C4.

There is mild multilevel intervertebral disc space narrowing.

Mild to moderate severity bilateral multilevel facet joint
hypertrophy is noted.

Upper chest: Negative.

Other: None.
IMPRESSION: 1. Mild to moderate severity degenerative changes, as described
above.
2. No evidence of an acute fracture or subluxation.

## 2021-02-05 IMAGING — CT CT MAXILLOFACIAL W/O CM
3 series · 16 of 47 positions shown, 19 images · non-contrast
Comparison: None.

CLINICAL DATA: Status post fall.

EXAM:
CT MAXILLOFACIAL WITHOUT CONTRAST
TECHNIQUE: Multidetector CT imaging of the maxillofacial structures was
performed. Multiplanar CT image reconstructions were also generated.

[Series 2: max soft · axial · 0.37mm/px · z∈[-194,-32]mm · 10 of 95 slices shown, 13 images]
[im 7/95  brain]
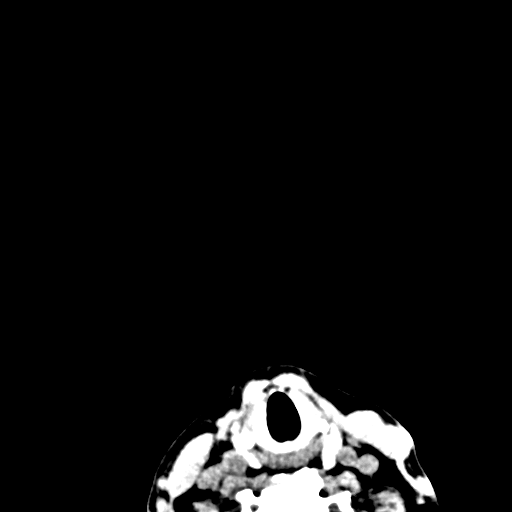
[im 7/95  bone]
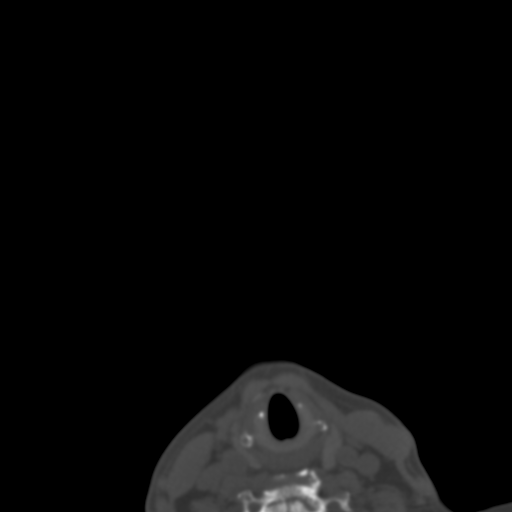
[im 17/95  bone]
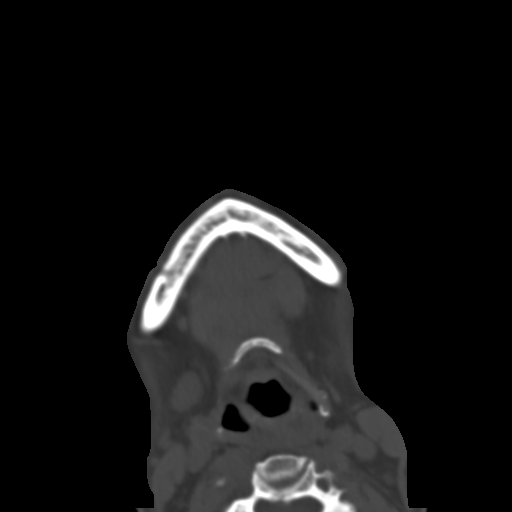
[im 26/95  bone]
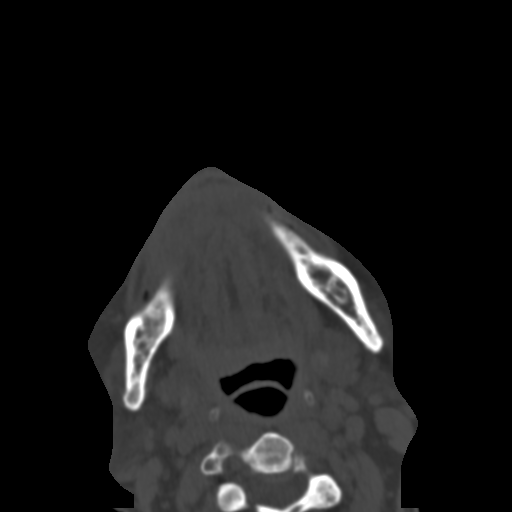
[im 33/95  bone]
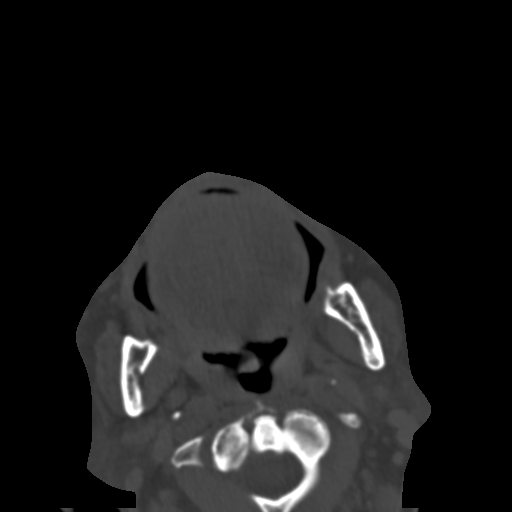
[im 43/95  brain]
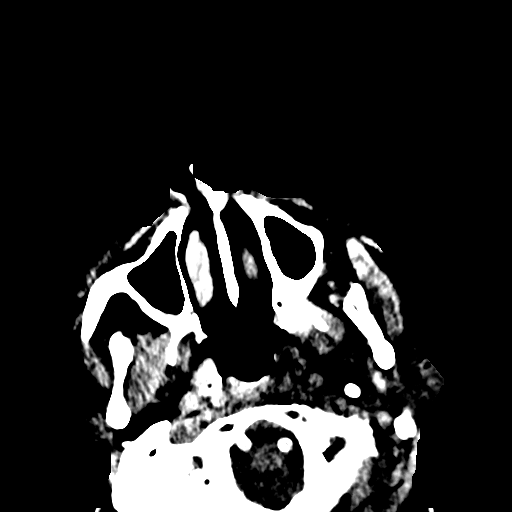
[im 43/95  bone]
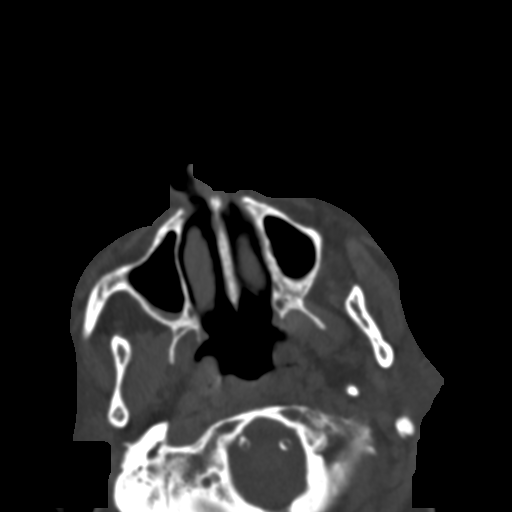
[im 52/95  bone]
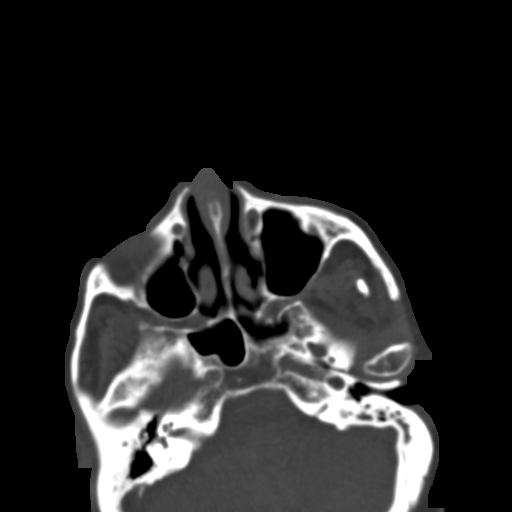
[im 62/95  bone]
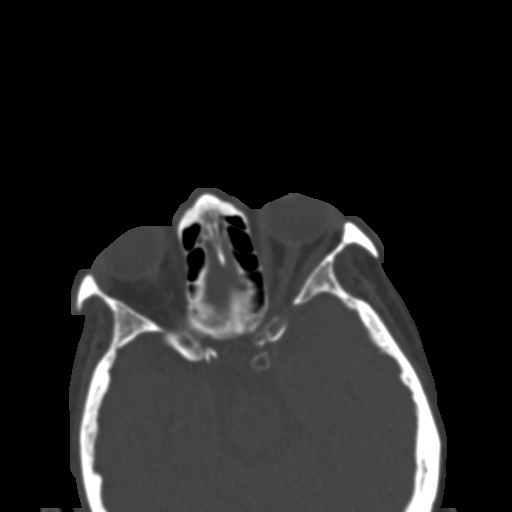
[im 72/95  bone]
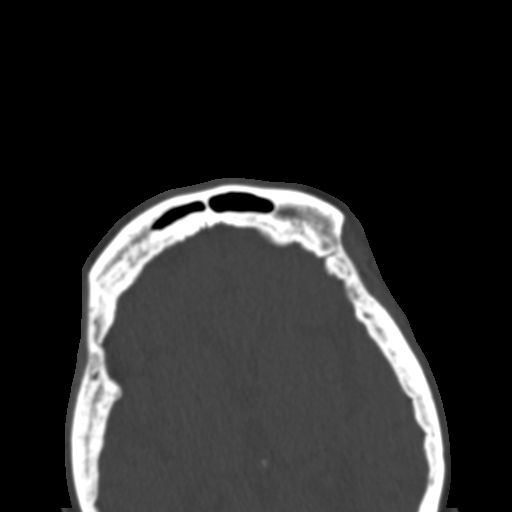
[im 78/95  brain]
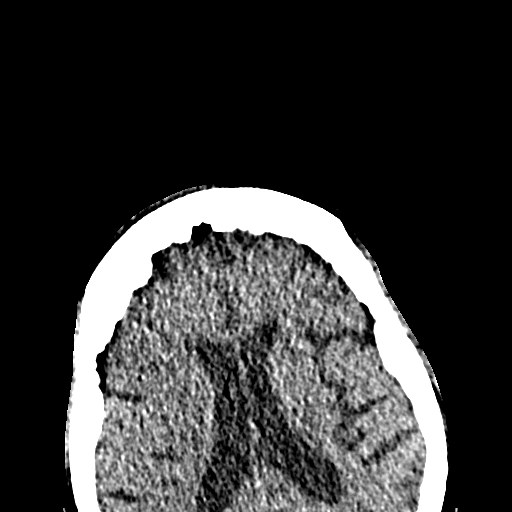
[im 78/95  bone]
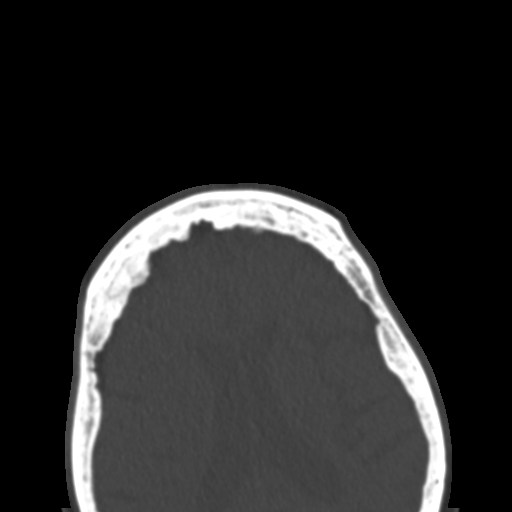
[im 88/95  bone]
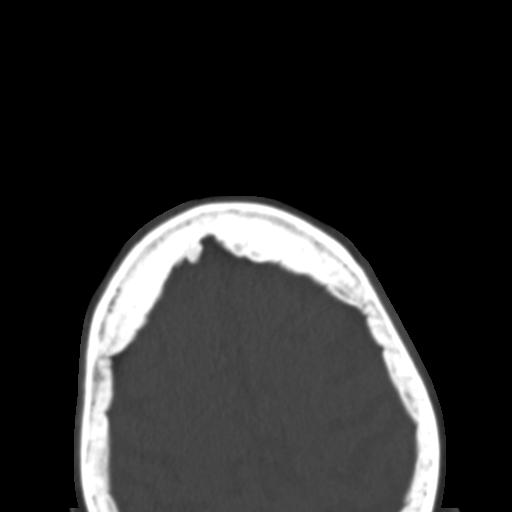

[Series 6: coronal soft · coronal · 0.39mm/px · 3 of 97 slices shown]
[im 33/97  bone]
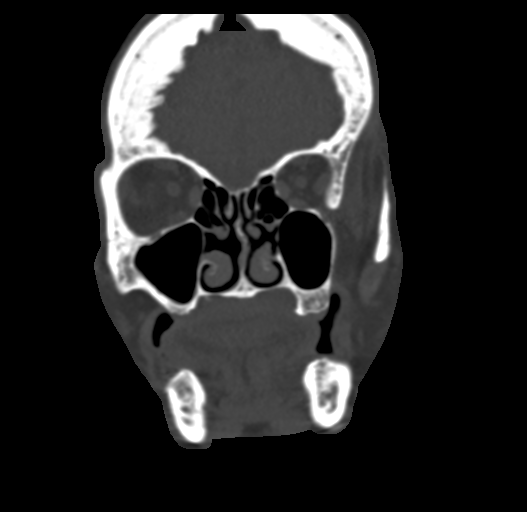
[im 43/97  bone]
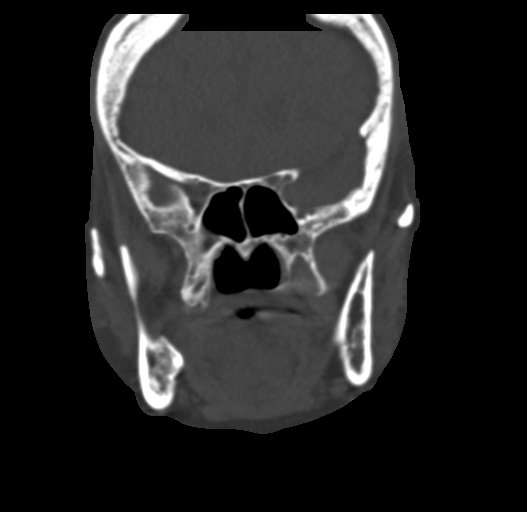
[im 54/97  bone]
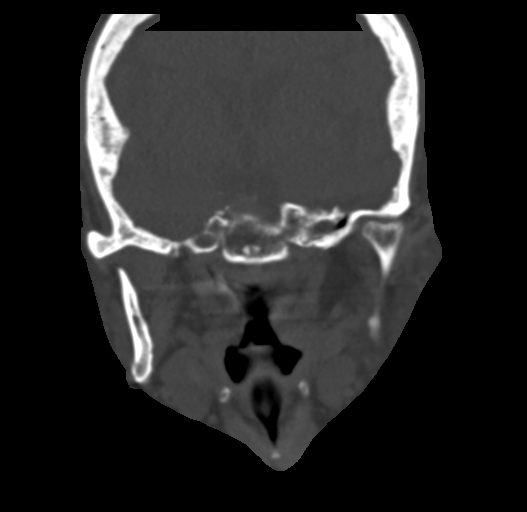

[Series 7: sagittal soft · sagittal · 0.35mm/px · 3 of 104 slices shown]
[im 35/104  bone]
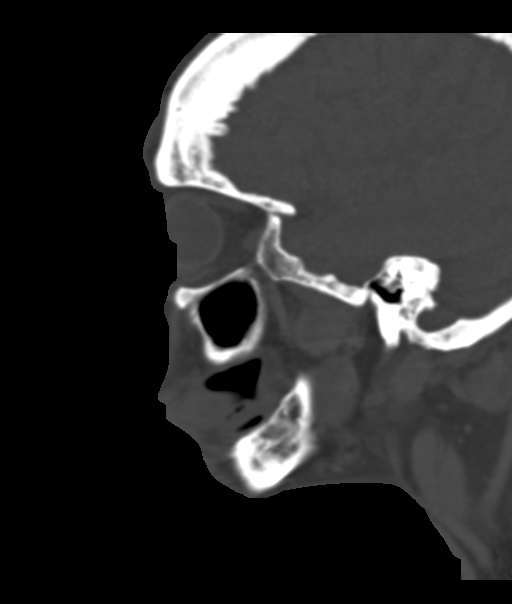
[im 52/104  bone]
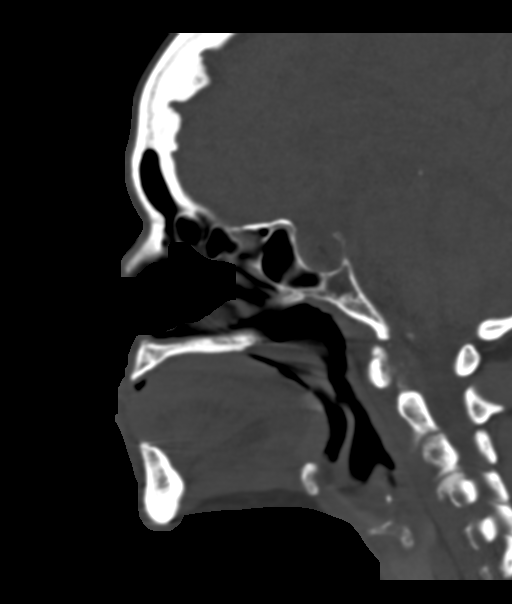
[im 69/104  bone]
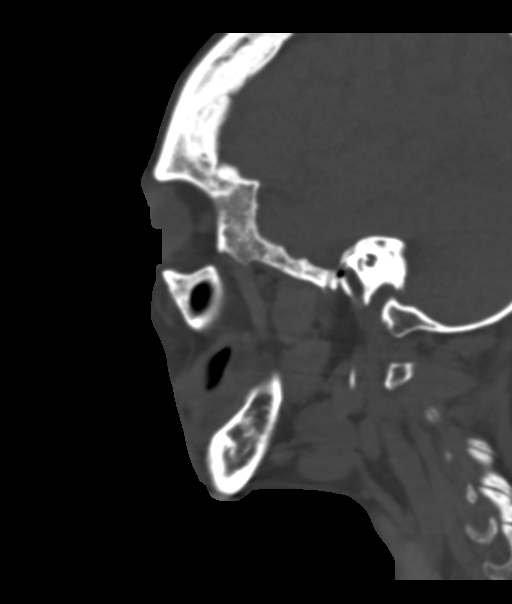

[16 of 47 positions shown; findings below may reference images not displayed]

FINDINGS: Osseous: No fracture or mandibular dislocation. No destructive
process.

Orbits: Negative. No traumatic or inflammatory finding.

Sinuses: Very mild right maxillary sinus mucosal thickening is seen.

Soft tissues: There is mild right-sided facial and left-sided
periorbital soft tissue swelling. Mild to moderate severity
bifrontal scalp soft tissue swelling is also seen. A mild amount of
scalp soft tissue swelling is noted within the frontal region on the
right.

Limited intracranial: No significant or unexpected finding.
IMPRESSION: 1. Mild right-sided facial and left-sided periorbital soft tissue
swelling.
2. Mild to moderate severity bifrontal scalp soft tissue swelling.
3. Very mild right maxillary sinus mucosal thickening.
4. No acute osseous abnormality.

## 2021-02-05 IMAGING — CT CT HEAD W/O CM
3 series · 15 of 47 positions shown, 18 images · non-contrast
Comparison: None.

CLINICAL DATA: Status post fall.

EXAM:
CT HEAD WITHOUT CONTRAST
TECHNIQUE: Contiguous axial images were obtained from the base of the skull
through the vertex without intravenous contrast.

[Series 2: head w o · axial · 0.42mm/px · z∈[-103,+22]mm · 9 of 31 slices shown, 12 images]
[im 3/31  brain]
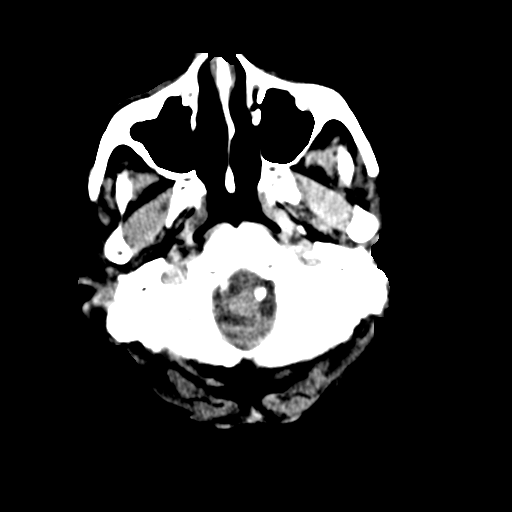
[im 3/31  bone]
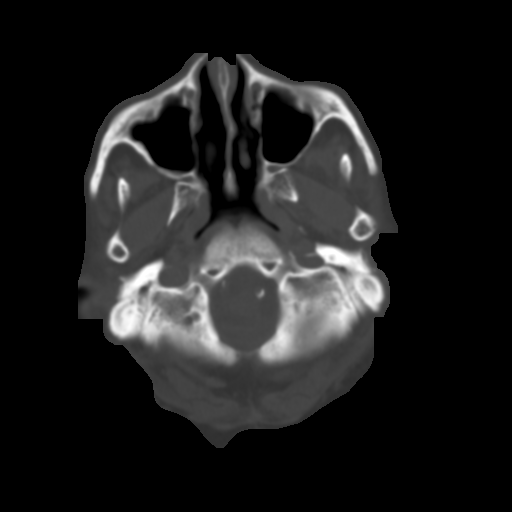
[im 6/31  brain]
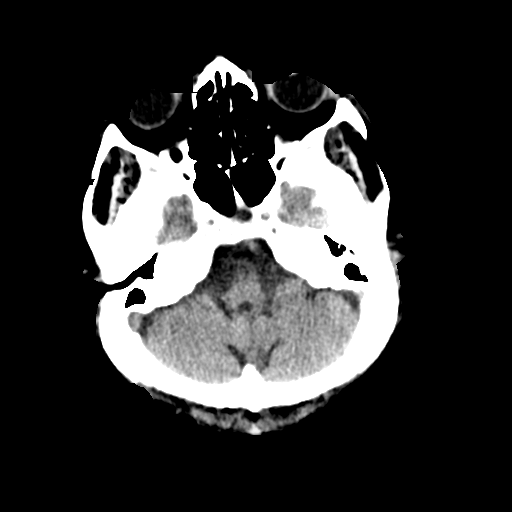
[im 9/31  brain]
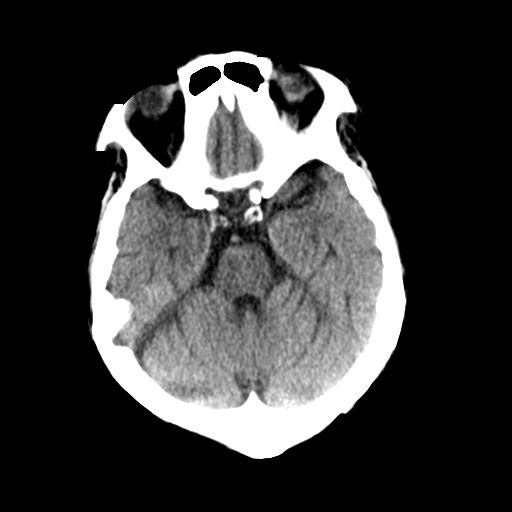
[im 12/31  brain]
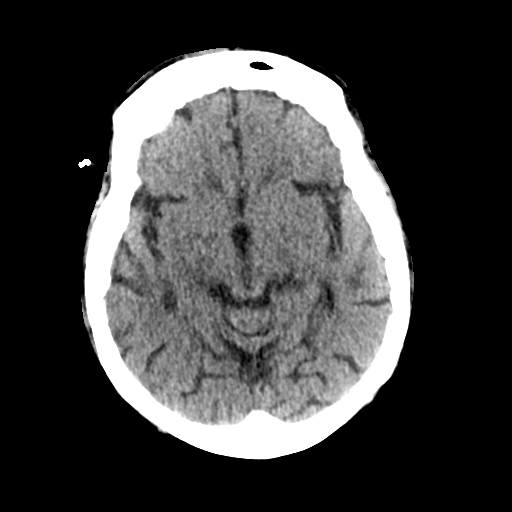
[im 16/31  brain]
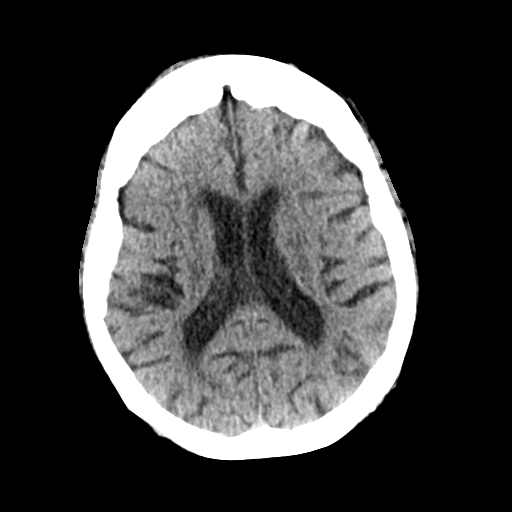
[im 16/31  bone]
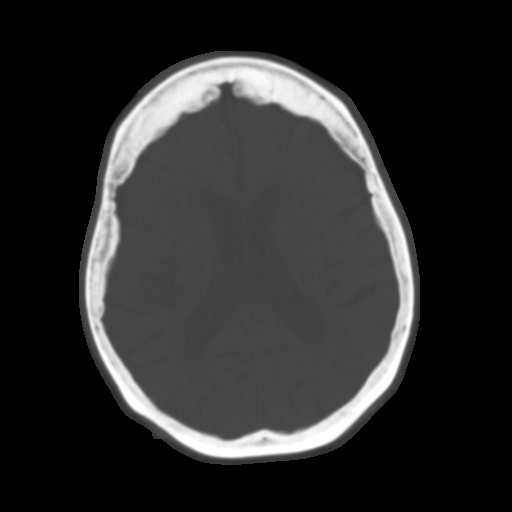
[im 19/31  brain]
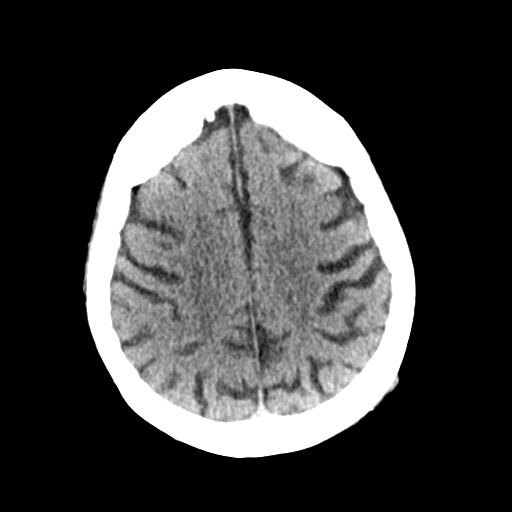
[im 22/31  brain]
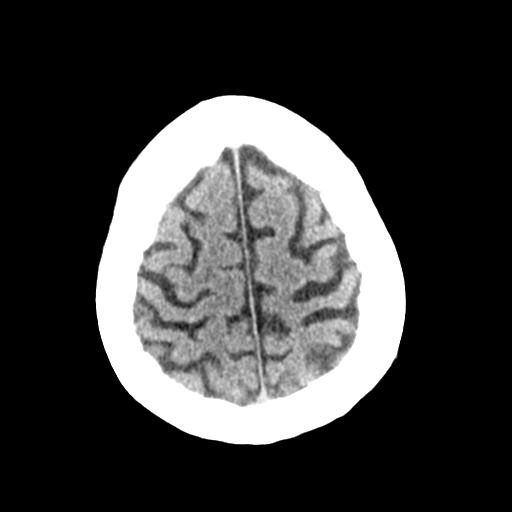
[im 25/31  brain]
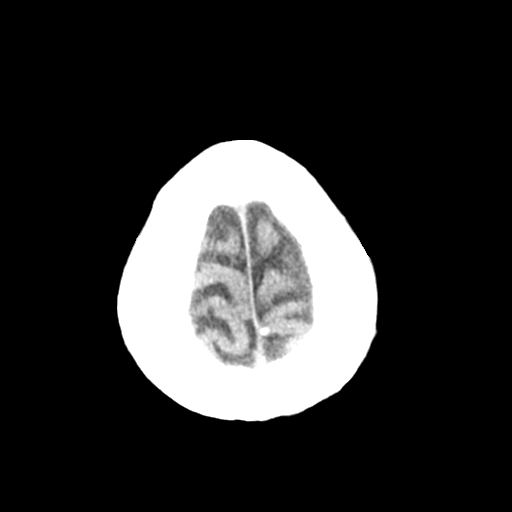
[im 28/31  brain]
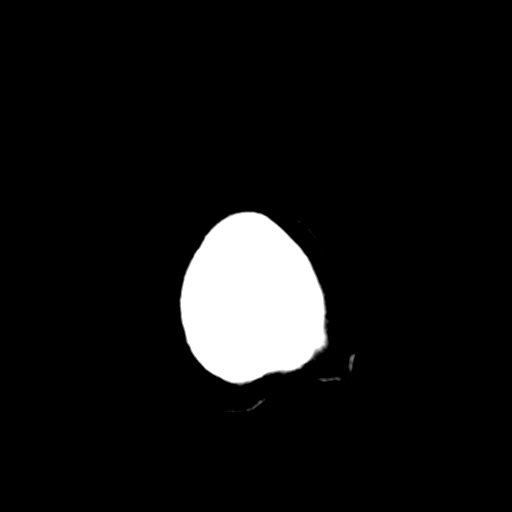
[im 28/31  bone]
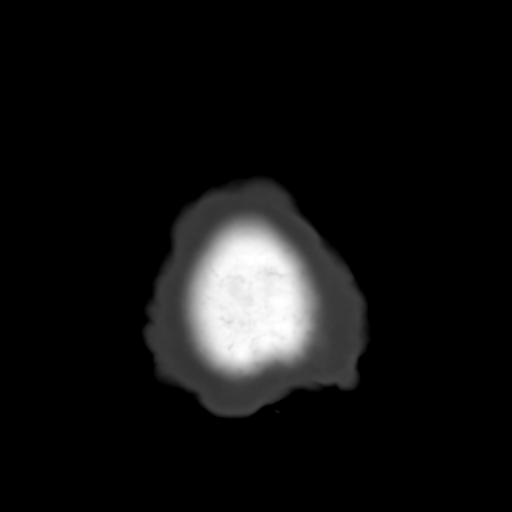

[Series 4: coronal soft · coronal · 0.31mm/px · 3 of 73 slices shown]
[im 25/73  brain]
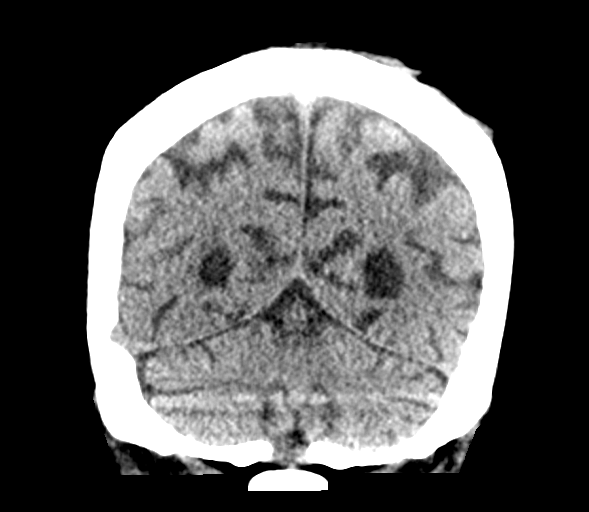
[im 33/73  brain]
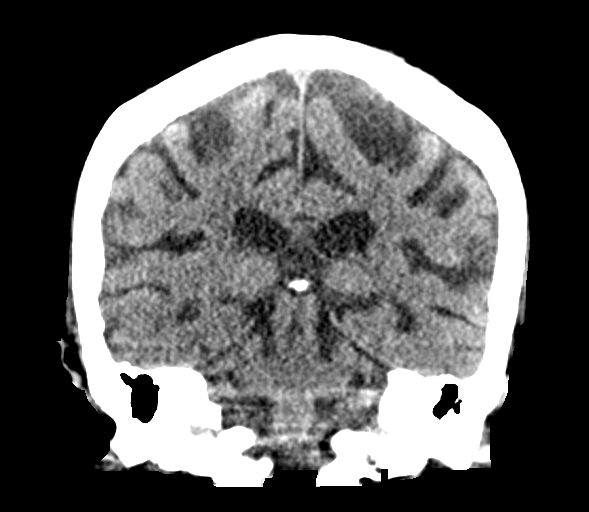
[im 41/73  brain]
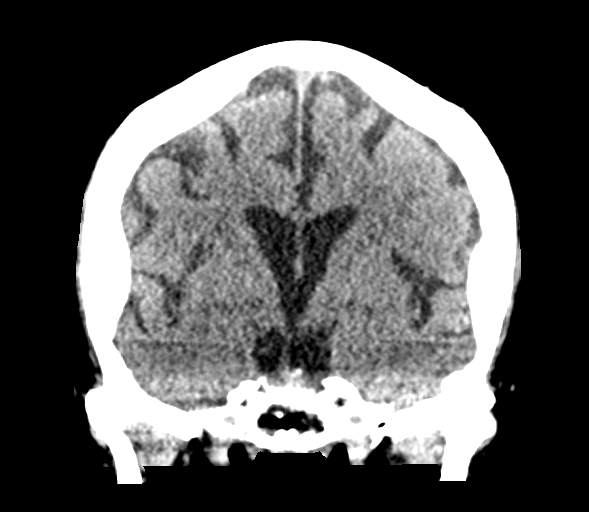

[Series 5: sagittal soft · sagittal · 0.32mm/px · 3 of 63 slices shown]
[im 21/63  brain]
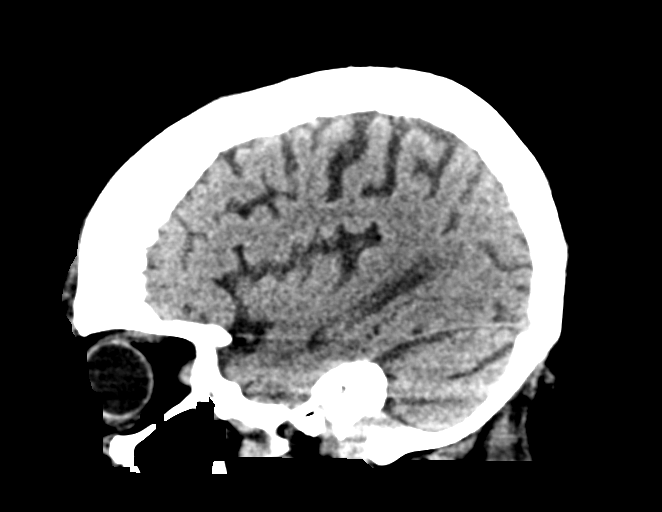
[im 32/63  brain]
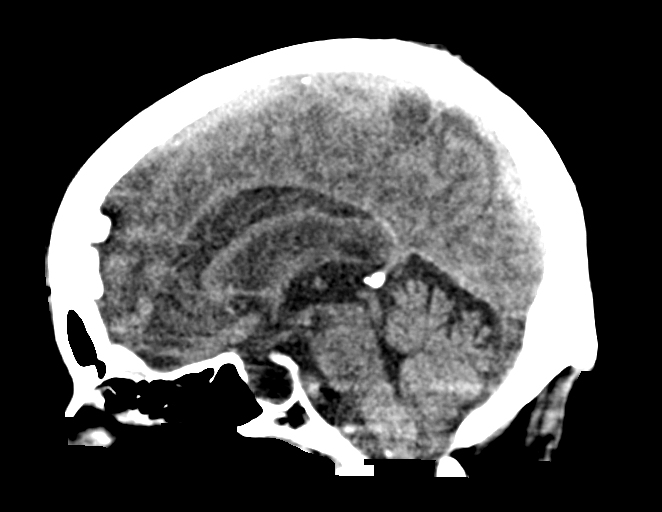
[im 42/63  brain]
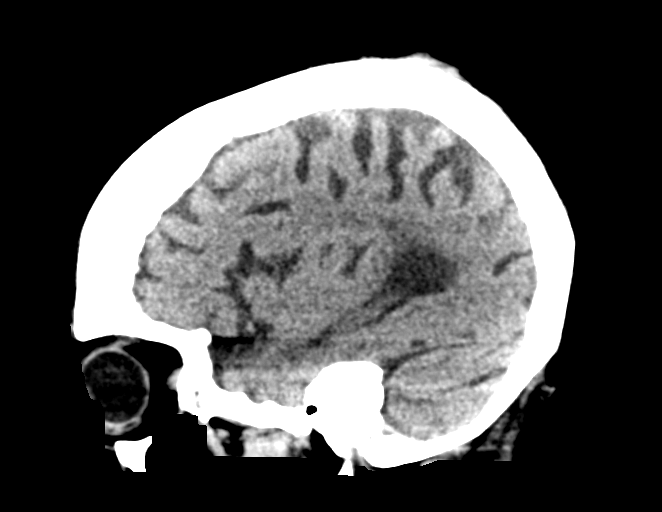

[15 of 47 positions shown; findings below may reference images not displayed]

FINDINGS: Brain: There is mild cerebral atrophy with widening of the
extra-axial spaces and ventricular dilatation.
There are areas of decreased attenuation within the white matter
tracts of the supratentorial brain, consistent with microvascular
disease changes.

Vascular: No hyperdense vessel or unexpected calcification.

Skull: Normal. Negative for fracture or focal lesion.

Sinuses/Orbits: There is mild right maxillary sinus mucosal
thickening.

Other: Mild left periorbital and mild to moderate severity bifrontal
scalp soft tissue swelling is seen.
IMPRESSION: 1. Mild left periorbital and mild to moderate severity bifrontal
scalp soft tissue swelling.
2. No acute intracranial abnormality.

## 2021-02-05 NOTE — ED Notes (Signed)
Daughter given update via telephone

## 2021-02-05 NOTE — ED Notes (Signed)
Spoke with daughter, [name], regarding pt status. Daughter states pt fell yesterday as well and wants EDP to check urine for possible UTI.

## 2021-02-05 NOTE — ED Triage Notes (Signed)
Pt arrives [facility] from [facility]. Pt fell face-forward out of wheelchair hitting head on the floor. C/o head hurting. Pt has a contusion on right forehead with an old contusion on the left forehead. Denies LOC.
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
## 2021-02-05 NOTE — ED Notes (Signed)
Pt trying to get out of bed. Posey belt put under pt

## 2021-02-05 NOTE — ED Notes (Signed)
Pt daughter called and this RN gave an update

## 2021-02-06 NOTE — Discharge Instructions (Signed)
CT head neck and face without any significant abnormalities.  Patient certainly has right forehead contusion hematoma.  And also has some contusion around the left thigh.  Also no evidence for urinary tract infection.  Urine was normal.  Patient stable for return back to nursing facility.

## 2021-02-06 NOTE — ED Notes (Signed)
Daughter called and notified of discharge back to the facility and updated on care

## 2021-02-06 NOTE — ED Notes (Signed)
Attempted to call [facility], [place]. Call is not going through at this time. Will re-try later

## 2021-02-06 NOTE — ED Notes (Signed)
EMS convo called ?

## 2021-03-06 ENCOUNTER — Other Ambulatory Visit: Payer: Self-pay

## 2021-03-06 ENCOUNTER — Encounter (HOSPITAL_COMMUNITY): Payer: Self-pay | Admitting: Emergency Medicine

## 2021-03-06 ENCOUNTER — Emergency Department (HOSPITAL_COMMUNITY): Payer: Medicare Other

## 2021-03-06 ENCOUNTER — Emergency Department (HOSPITAL_COMMUNITY)
Admission: EM | Admit: 2021-03-06 | Discharge: 2021-03-06 | Disposition: A | Discharge status: Skilled Nursing Facility | Payer: Medicare Other | Attending: Emergency Medicine | Admitting: Emergency Medicine

## 2021-03-06 DIAGNOSIS — Z7982 Long term (current) use of aspirin: Secondary | ICD-10-CM | POA: Insufficient documentation

## 2021-03-06 DIAGNOSIS — F0390 Unspecified dementia without behavioral disturbance: Secondary | ICD-10-CM | POA: Insufficient documentation

## 2021-03-06 DIAGNOSIS — S0993XA Unspecified injury of face, initial encounter: Secondary | ICD-10-CM | POA: Diagnosis present

## 2021-03-06 DIAGNOSIS — G20 Parkinson's disease: Secondary | ICD-10-CM | POA: Insufficient documentation

## 2021-03-06 DIAGNOSIS — Z79899 Other long term (current) drug therapy: Secondary | ICD-10-CM | POA: Insufficient documentation

## 2021-03-06 DIAGNOSIS — I2510 Atherosclerotic heart disease of native coronary artery without angina pectoris: Secondary | ICD-10-CM | POA: Insufficient documentation

## 2021-03-06 DIAGNOSIS — Z7984 Long term (current) use of oral hypoglycemic drugs: Secondary | ICD-10-CM | POA: Insufficient documentation

## 2021-03-06 DIAGNOSIS — S0083XA Contusion of other part of head, initial encounter: Secondary | ICD-10-CM

## 2021-03-06 DIAGNOSIS — Z951 Presence of aortocoronary bypass graft: Secondary | ICD-10-CM | POA: Insufficient documentation

## 2021-03-06 DIAGNOSIS — W19XXXA Unspecified fall, initial encounter: Secondary | ICD-10-CM

## 2021-03-06 DIAGNOSIS — E119 Type 2 diabetes mellitus without complications: Secondary | ICD-10-CM | POA: Insufficient documentation

## 2021-03-06 DIAGNOSIS — W06XXXA Fall from bed, initial encounter: Secondary | ICD-10-CM | POA: Diagnosis not present

## 2021-03-06 DIAGNOSIS — I119 Hypertensive heart disease without heart failure: Secondary | ICD-10-CM | POA: Insufficient documentation

## 2021-03-06 IMAGING — CT CT MAXILLOFACIAL W/O CM
3 series · 16 of 47 positions shown, 19 images · non-contrast
Comparison: Head CT today.  Face CT [DATE].

CLINICAL DATA: 65-year-old female status post fall from bed.
Hematoma over right eye.

EXAM:
CT MAXILLOFACIAL WITHOUT CONTRAST
TECHNIQUE: Multidetector CT imaging of the maxillofacial structures was
performed. Multiplanar CT image reconstructions were also generated.

[Series 2: max soft · axial · 0.41mm/px · z∈[-68,+66]mm · 10 of 79 slices shown, 13 images]
[im 6/79  brain]
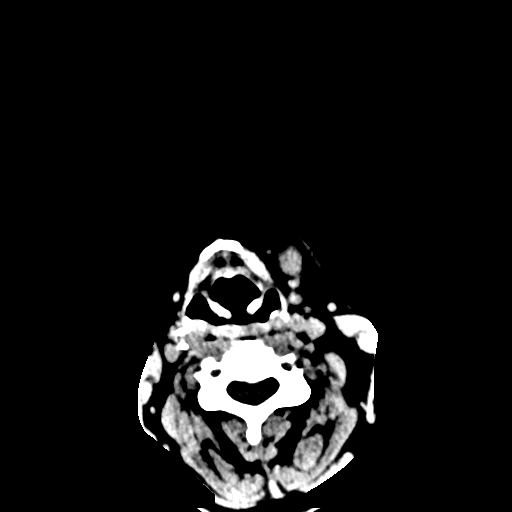
[im 6/79  bone]
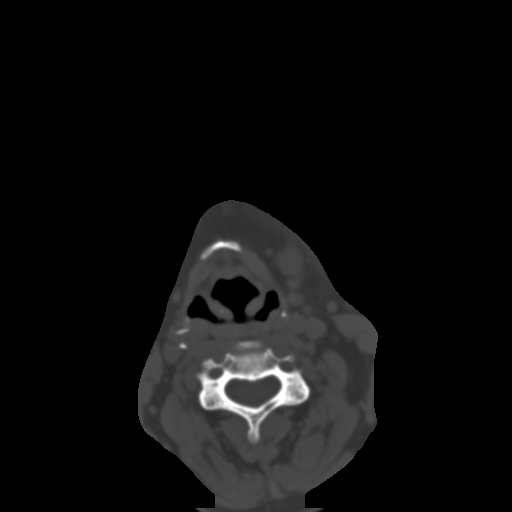
[im 14/79  bone]
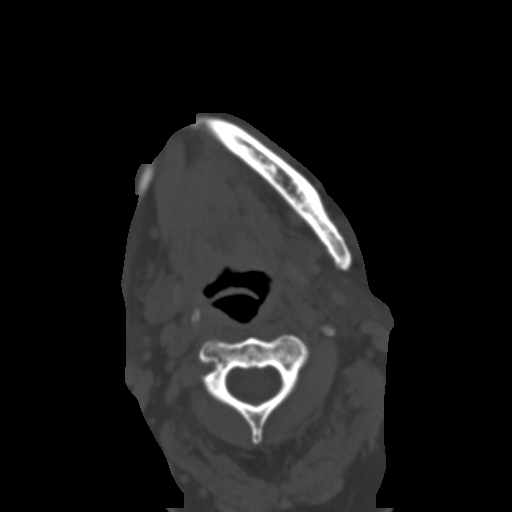
[im 22/79  bone]
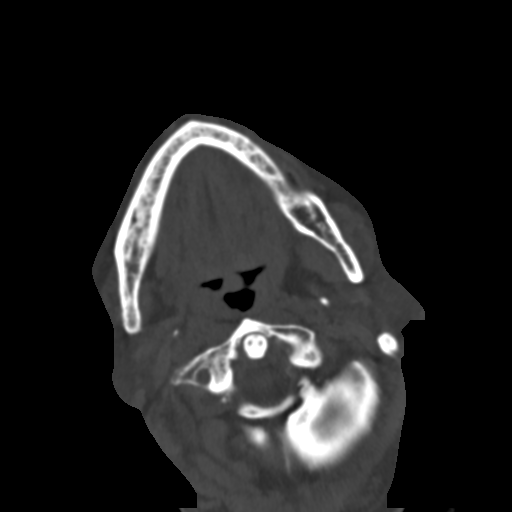
[im 27/79  bone]
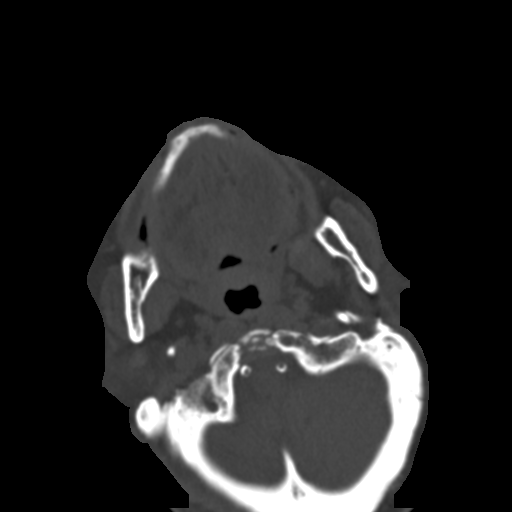
[im 35/79  brain]
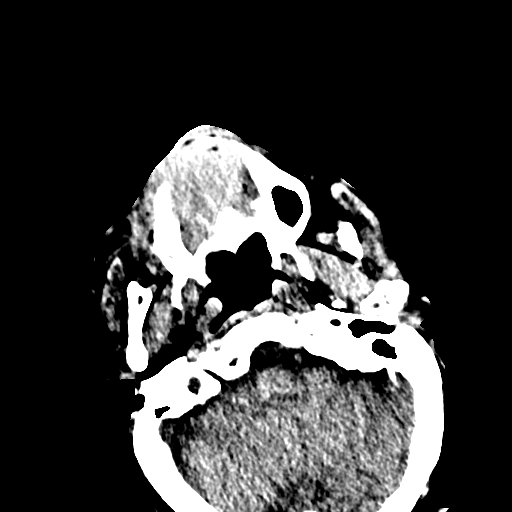
[im 35/79  bone]
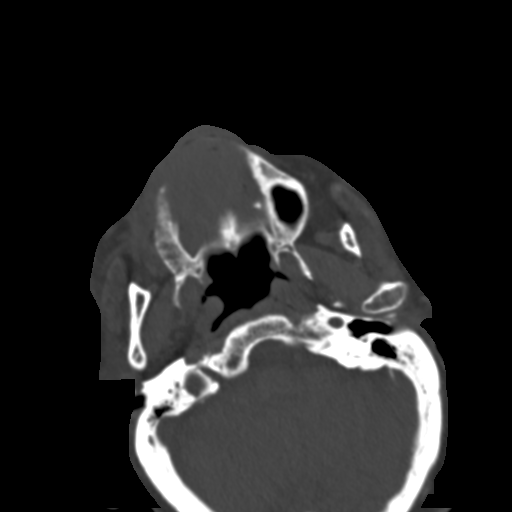
[im 44/79  bone]
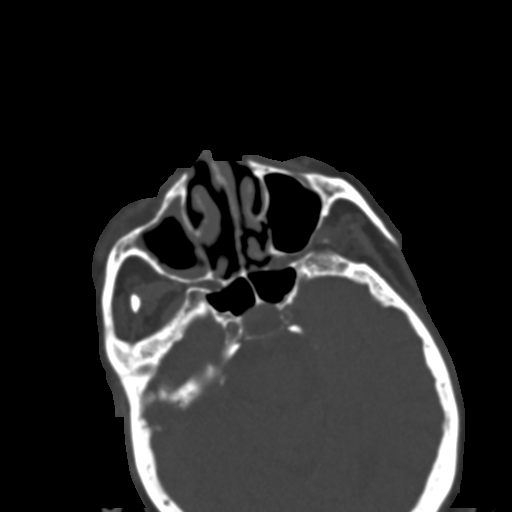
[im 52/79  bone]
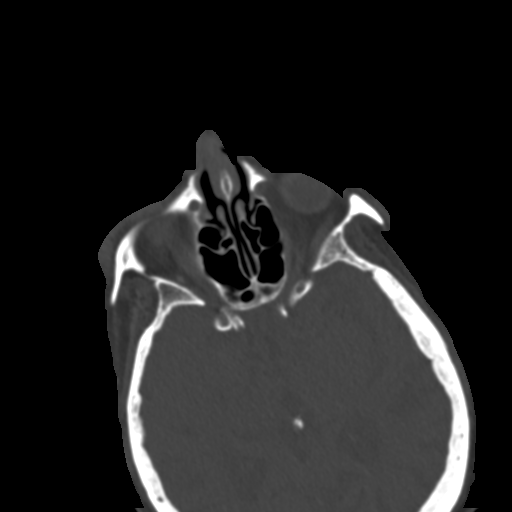
[im 60/79  bone]
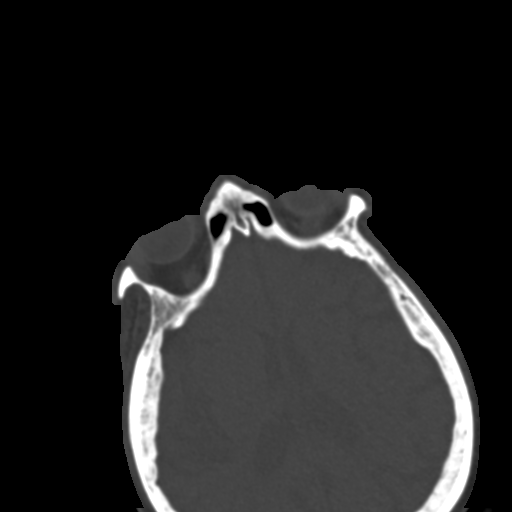
[im 65/79  brain]
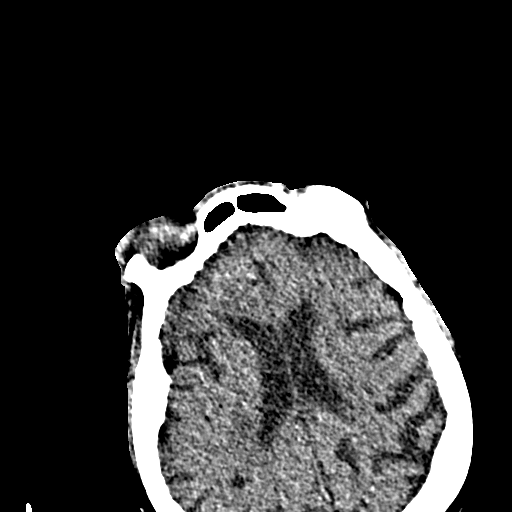
[im 65/79  bone]
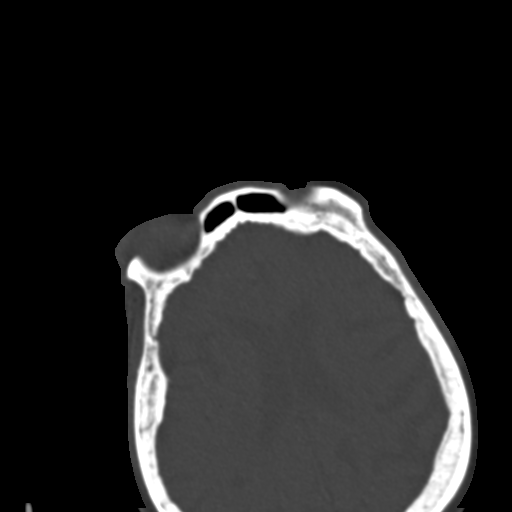
[im 73/79  bone]
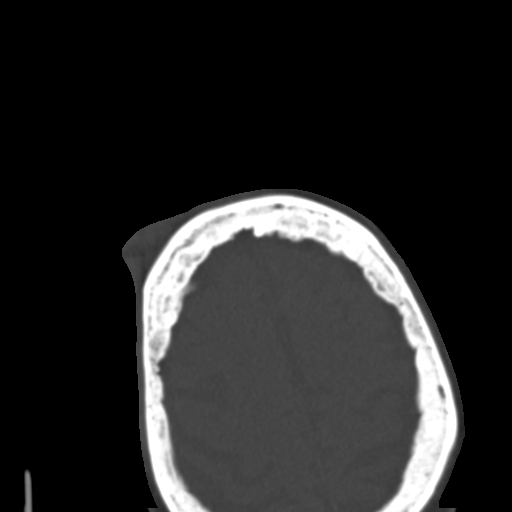

[Series 6: coronal soft · coronal · 0.36mm/px · 3 of 86 slices shown]
[im 29/86  bone]
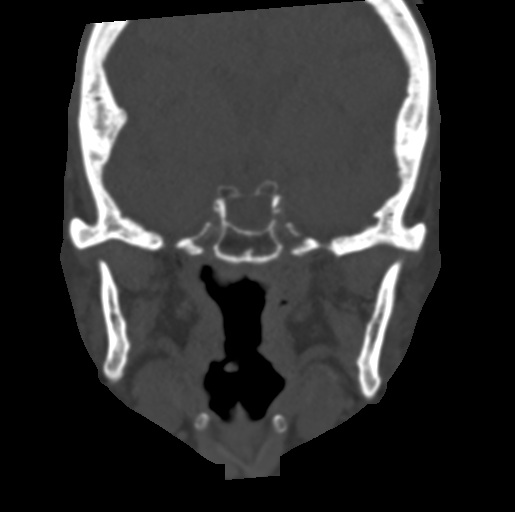
[im 38/86  bone]
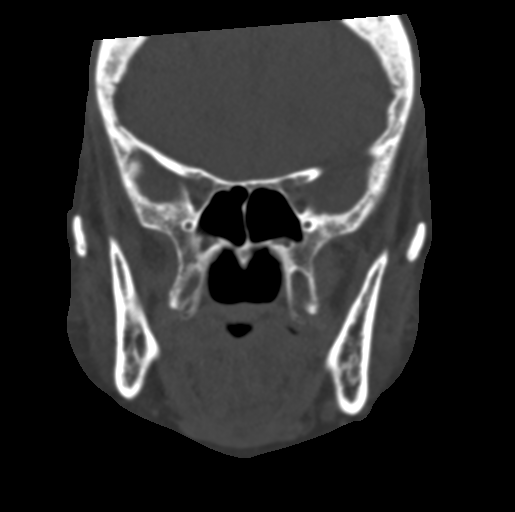
[im 48/86  bone]
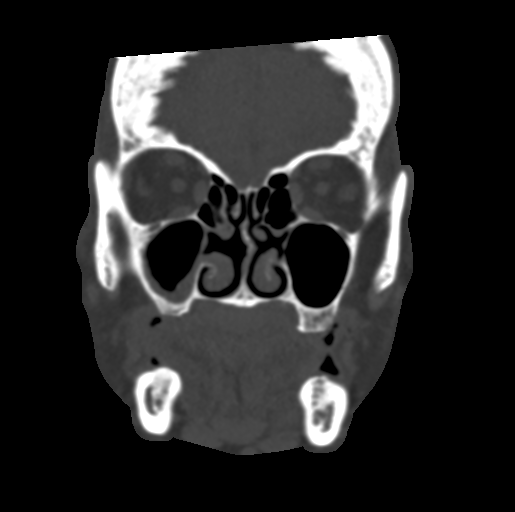

[Series 7: sagittal soft · sagittal · 0.33mm/px · 3 of 92 slices shown]
[im 31/92  bone]
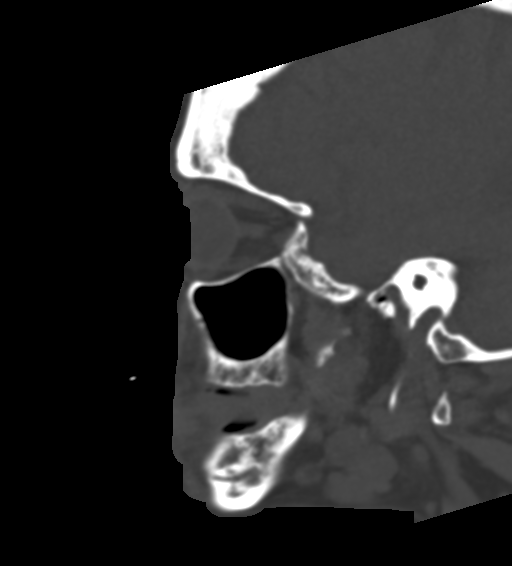
[im 46/92  bone]
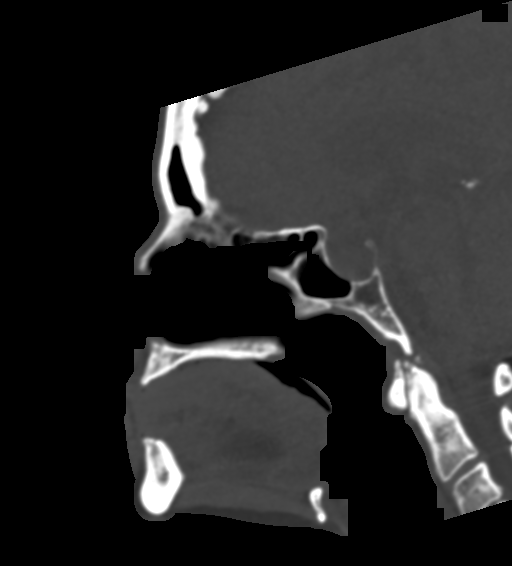
[im 61/92  bone]
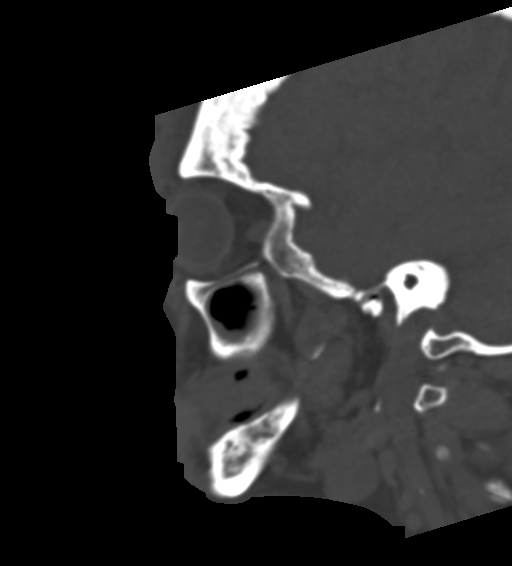

[16 of 47 positions shown; findings below may reference images not displayed]

FINDINGS: Osseous: Absent dentition. Mandible intact and normally located.
Maxilla, zygoma, pterygoid, and nasal bones appear stable and
intact. Hyperostosis of the visible calvarium. Central skull base
intact. Cervical spine detailed separately.

Orbits: Intact orbital walls. Superficial right periorbital
hematoma. Right globe and intraorbital soft tissues remain normal.
Negative left orbits soft tissues.

Sinuses: Mild circumferential right maxillary sinus mucosal
thickening is new, with a small right maxillary fluid level on the
prior exam. Other paranasal sinuses and mastoids are clear.

Soft tissues: Negative visible noncontrast deep soft tissue spaces
of the face.

Mild to moderate patchy right premalar and zygoma region superficial
hematoma/contusion, with nearly contiguous larger right lateral
periorbital and supraorbital superficial hematoma. No soft tissue
gas.

Limited intracranial: Reported separately today.
IMPRESSION: 1. Superficial right face and periorbital hematoma/contusion. No
underlying fracture. Right globe and intraorbital soft tissues
remain normal.
2. Mild right maxillary sinus inflammation.

## 2021-03-06 IMAGING — CT CT HEAD W/O CM
3 of 4 series · 13 of 47 positions shown, 15 images · non-contrast
Comparison: Head CT [DATE].  Brain MRI [DATE] and earlier.

CLINICAL DATA: 65-year-old female status post fall from bed.
Hematoma over right eye.

EXAM:
CT HEAD WITHOUT CONTRAST
TECHNIQUE: Contiguous axial images were obtained from the base of the skull
through the vertex without intravenous contrast.

[Series 2: head w o · axial · 0.42mm/px · z∈[+12,+132]mm · 7 of 33 slices shown, 9 images]
[im 5/33  brain]
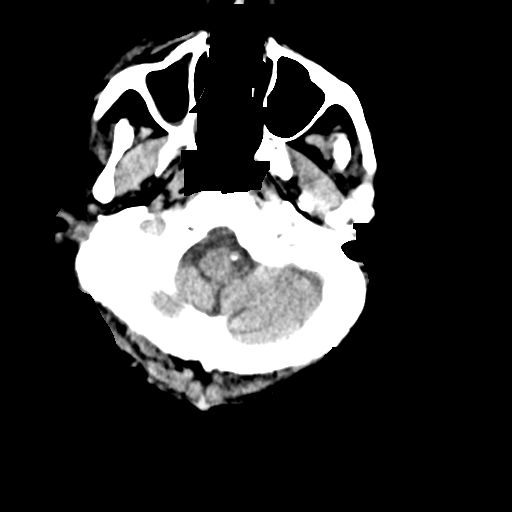
[im 5/33  bone]
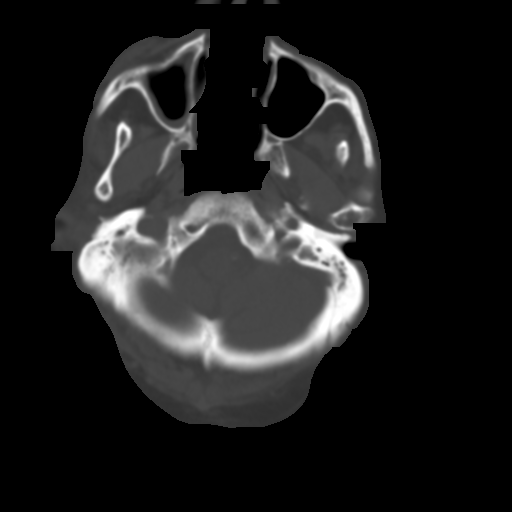
[im 9/33  brain]
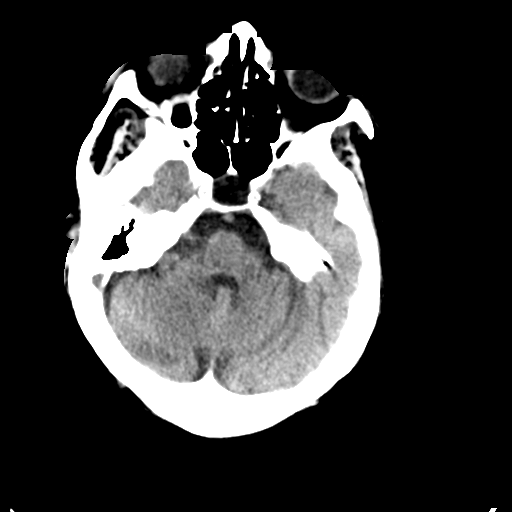
[im 13/33  brain]
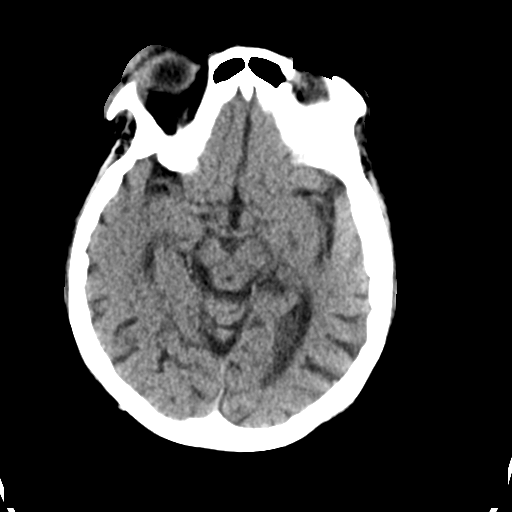
[im 17/33  brain]
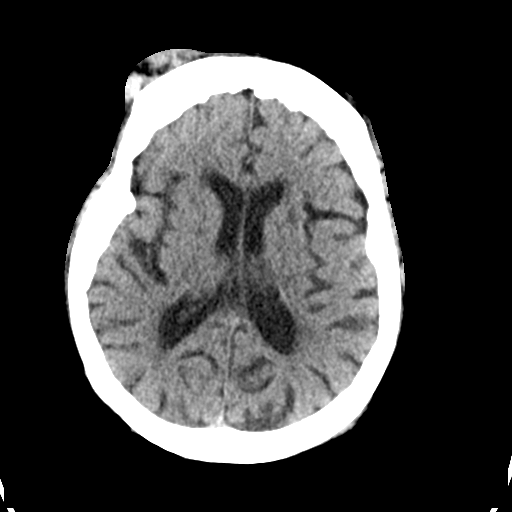
[im 21/33  brain]
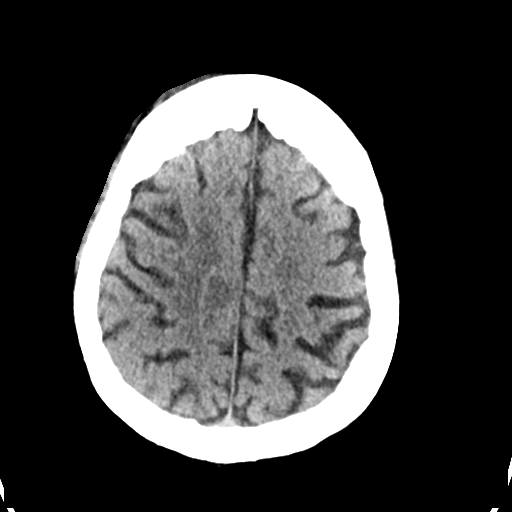
[im 21/33  bone]
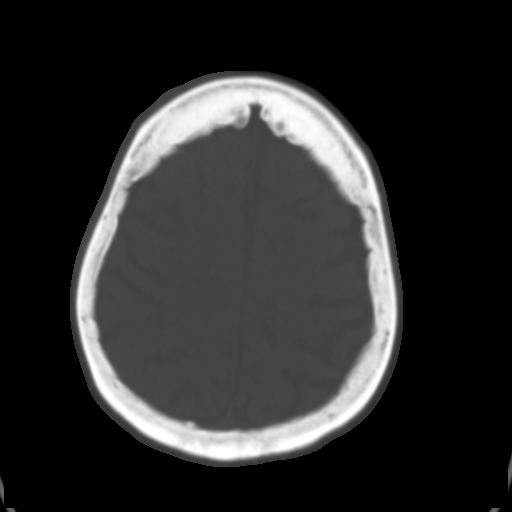
[im 25/33  brain]
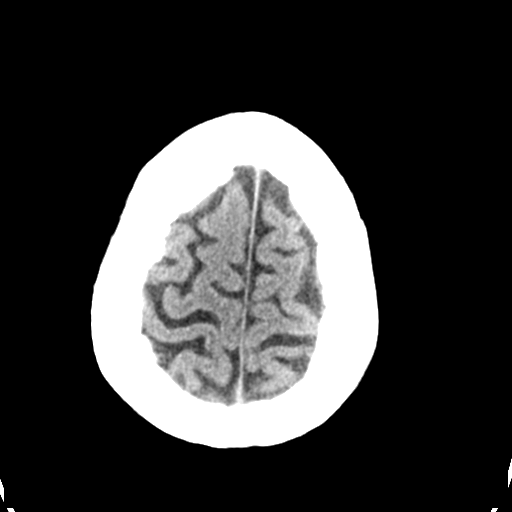
[im 29/33  brain]
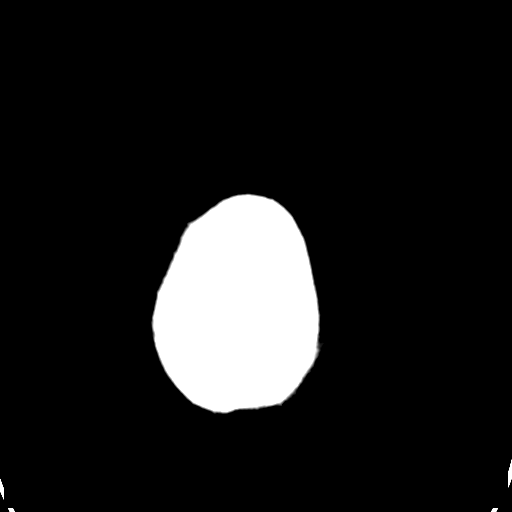

[Series 4: coronal soft · coronal · 0.32mm/px · 3 of 67 slices shown]
[im 23/67  brain]
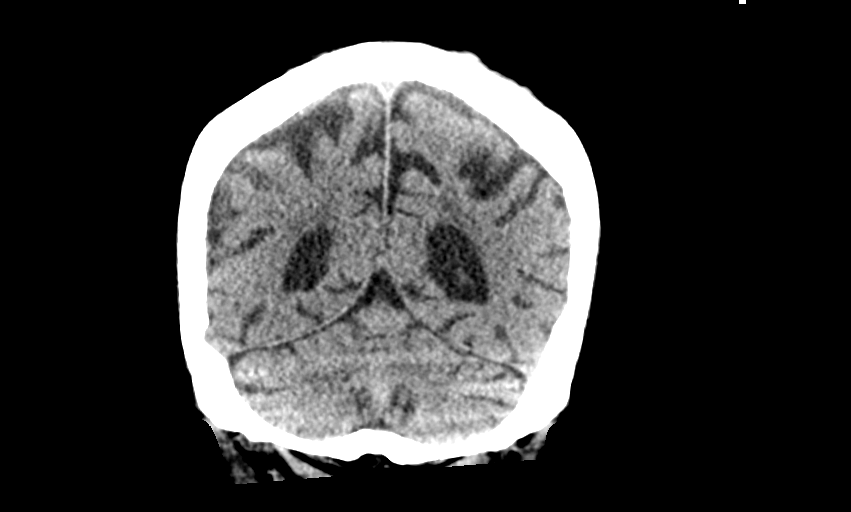
[im 30/67  brain]
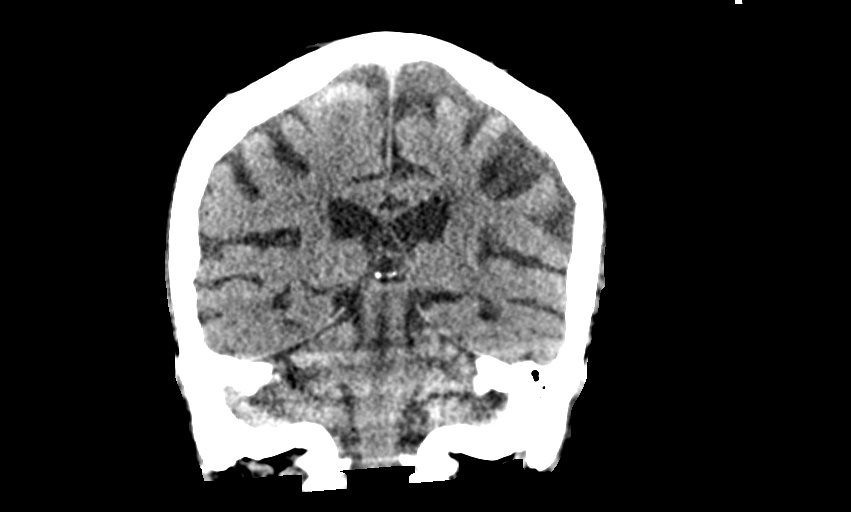
[im 37/67  brain]
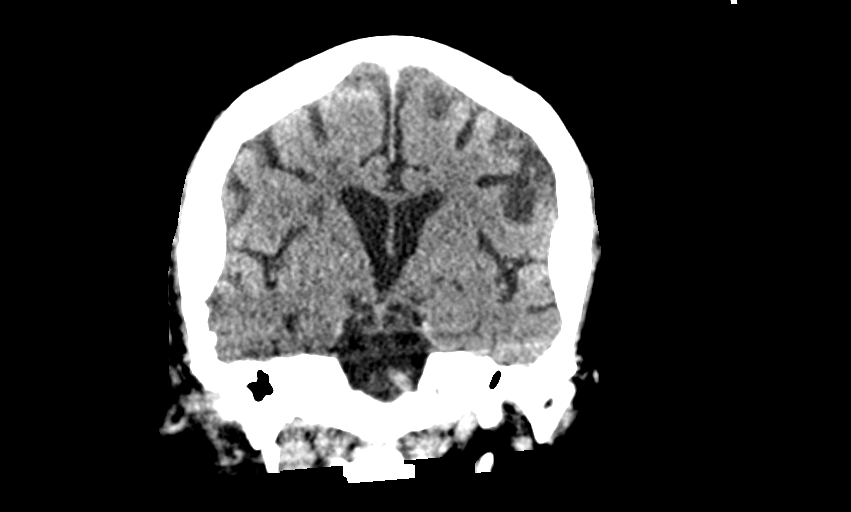

[Series 5: sagittal soft · sagittal · 0.32mm/px · 3 of 60 slices shown]
[im 20/60  brain]
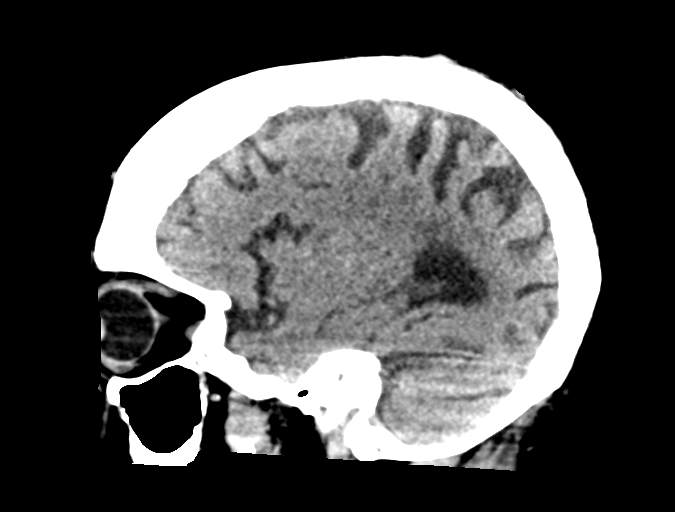
[im 30/60  brain]
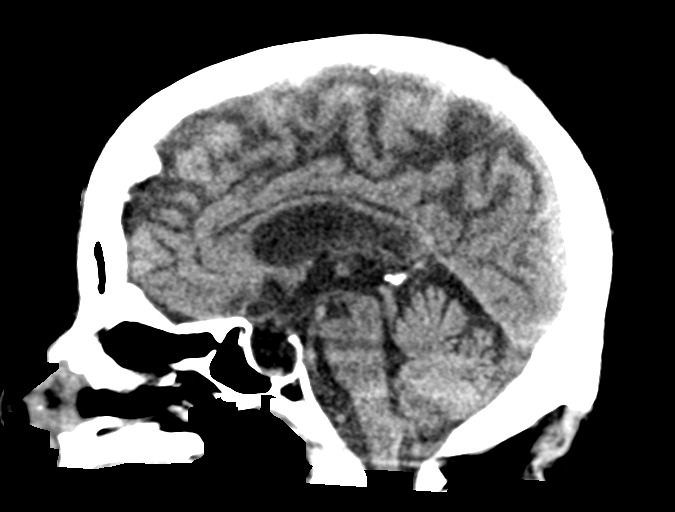
[im 40/60  brain]
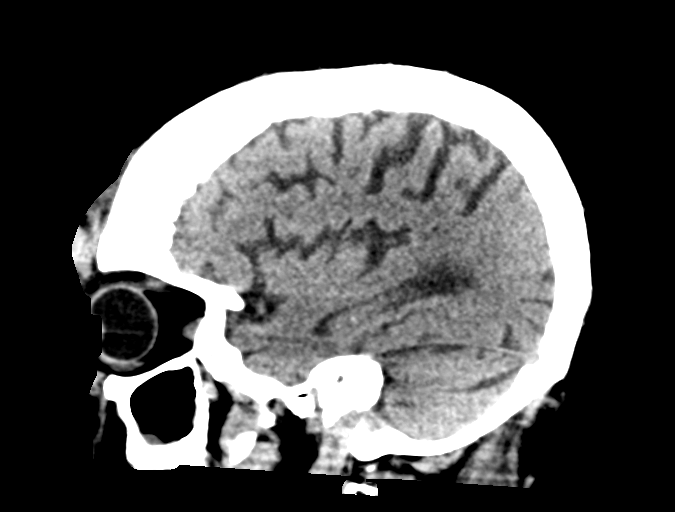

[13 of 47 positions shown; findings below may reference images not displayed]

FINDINGS: Brain: Stable cerebral volume. No midline shift, ventriculomegaly,
mass effect, evidence of mass lesion, intracranial hemorrhage or
evidence of cortically based acute infarction. Patchy bilateral
white matter hypodensity with deep white matter capsule involvement
appears stable. No cortical encephalomalacia identified.

Vascular: Extensive Calcified atherosclerosis at the skull base. No
suspicious intracranial vascular hyperdensity.

Skull: Stable.  No acute osseous abnormality identified.

Sinuses/Orbits: Chronic right maxillary sinus mucosal thickening is
mild but increased. Other Visualized paranasal sinuses and mastoids
are stable and well aerated.

Other: Right periorbital and supraorbital scalp hematoma measuring
up to 15 mm in thickness. Underlying right globe and skull appear
intact. See also face CT. Other scalp soft tissues appears stable.
IMPRESSION: 1. Relatively large right periorbital and supraorbital scalp
hematoma without underlying fracture. See also Face CT today
reported separately.
2. No acute intracranial abnormality. Stable mild to moderate for
age white matter small vessel disease.

## 2021-03-06 IMAGING — CT CT CERVICAL SPINE W/O CM
3 of 4 series · 12 of 33 positions shown, 14 images · non-contrast
Comparison: Head and face CT today.  Cervical spine CT [DATE].

CLINICAL DATA: 65-year-old female status post fall from bed.
Hematoma over right eye.

EXAM:
CT CERVICAL SPINE WITHOUT CONTRAST
TECHNIQUE: Multidetector CT imaging of the cervical spine was performed without
intravenous contrast. Multiplanar CT image reconstructions were also
generated.

[Series 5: sag bone · sagittal · 0.30mm/px · 5 of 61 slices shown, 6 images]
[im 21/61  bone]
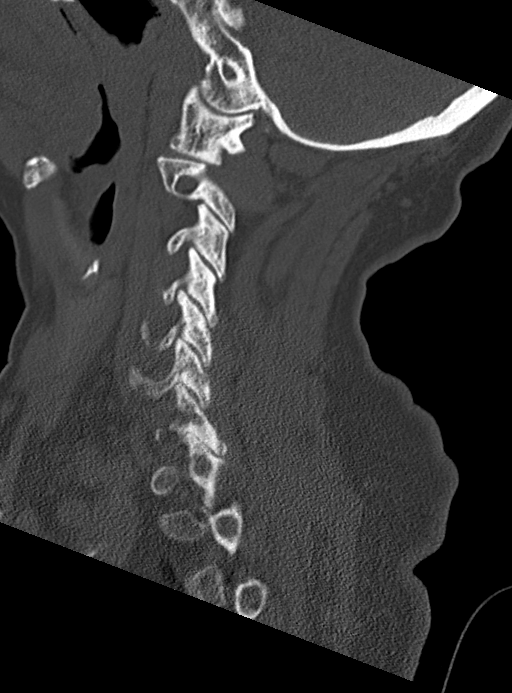
[im 26/61  bone]
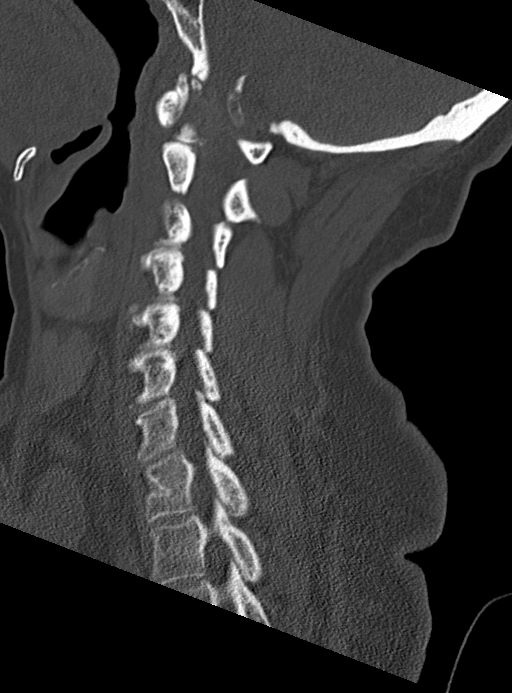
[im 31/61  soft-tissue]
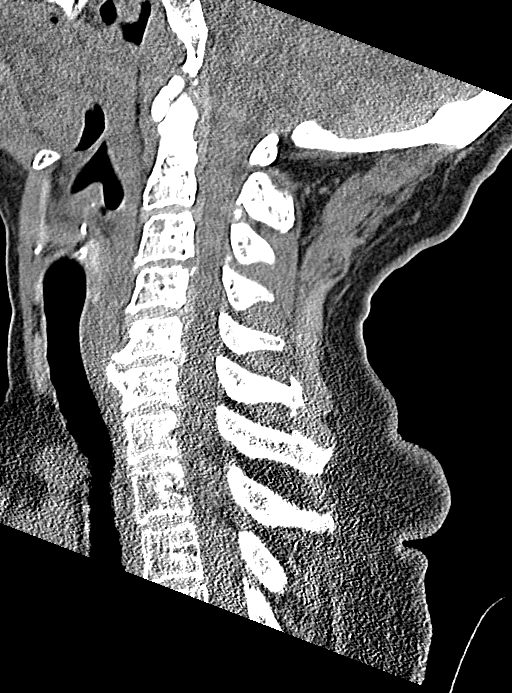
[im 31/61  bone]
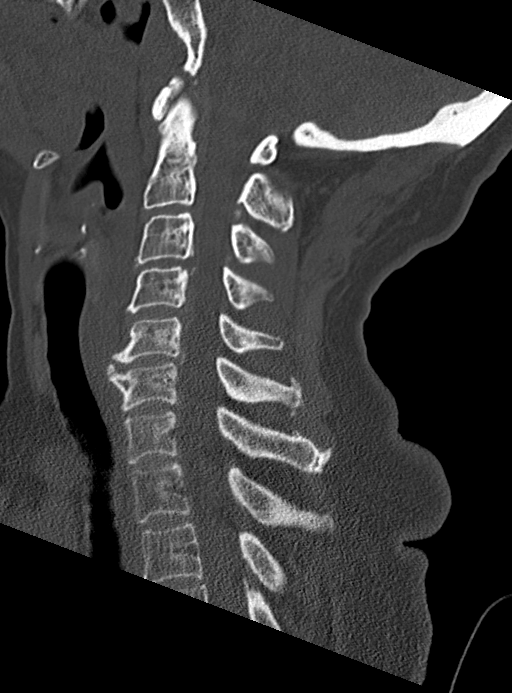
[im 36/61  bone]
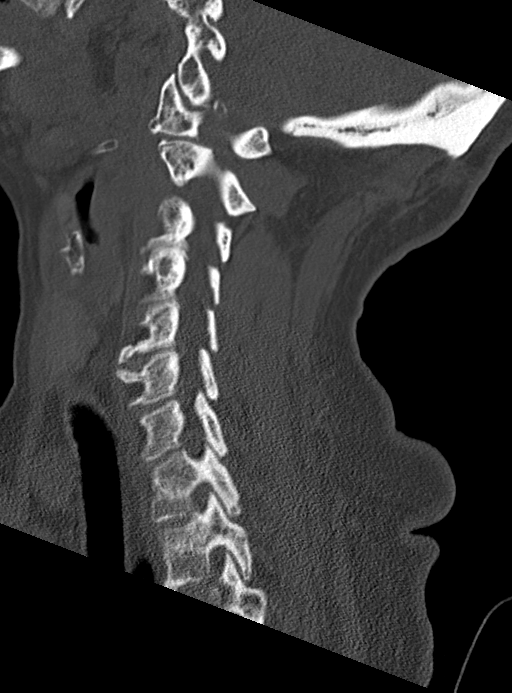
[im 41/61  bone]
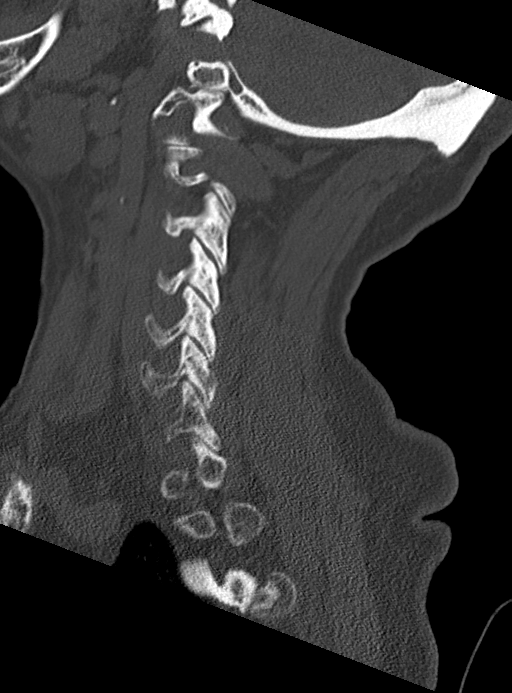

[Series 6: cor bone · coronal · 0.25mm/px · 3 of 66 slices shown]
[im 14/66  bone]
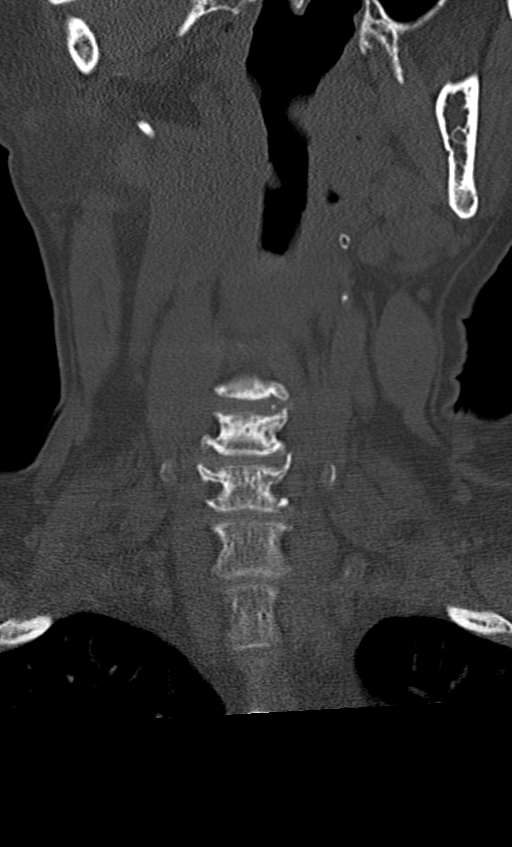
[im 27/66  bone]
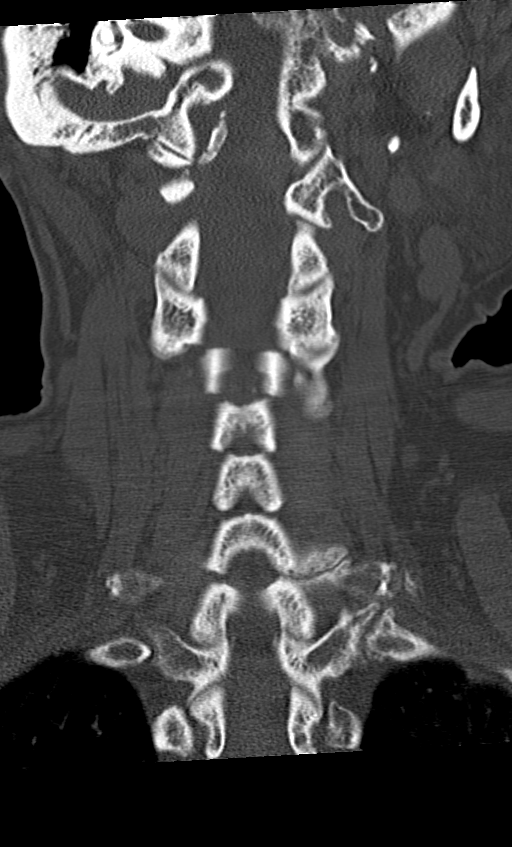
[im 39/66  bone]
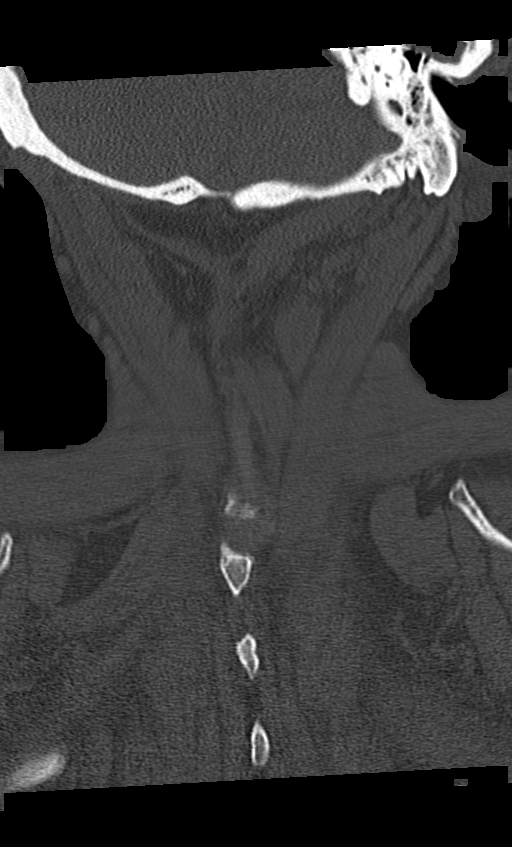

[Series 7: orthogonal axials · axial · 0.21mm/px · z∈[-165,-44]mm · 4 of 88 slices shown, 5 images]
[im 15/88  soft-tissue]
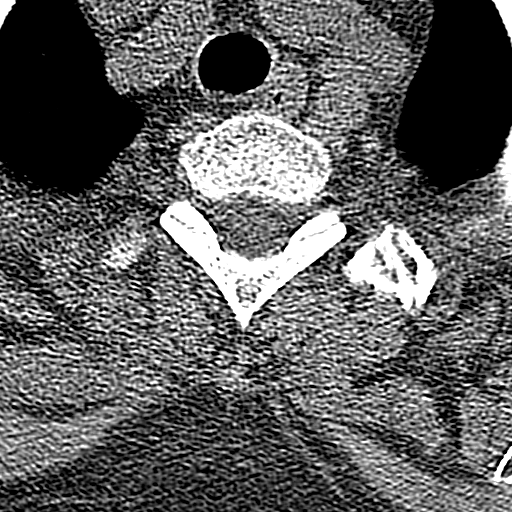
[im 15/88  bone]
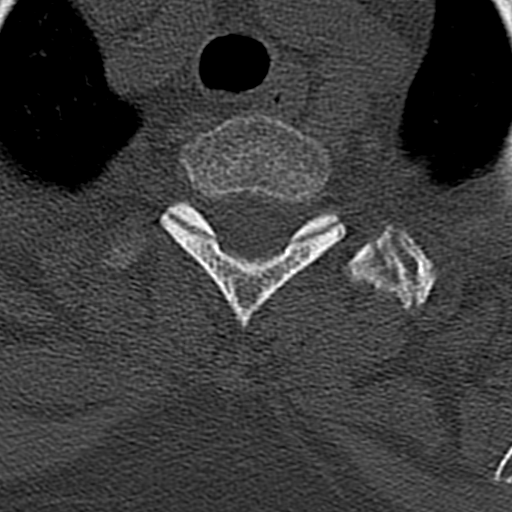
[im 30/88  bone]
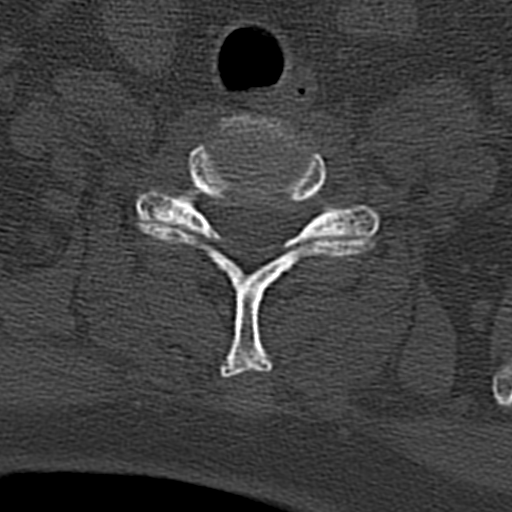
[im 59/88  bone]
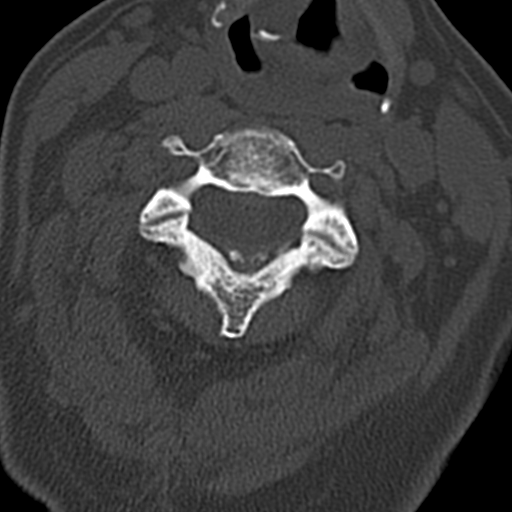
[im 73/88  bone]
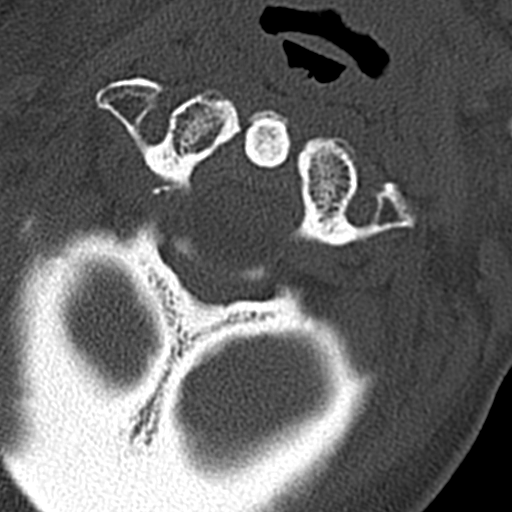

[12 of 33 positions shown; findings below may reference images not displayed]

FINDINGS: Alignment: Stable, mild straightening of cervical lordosis.
Cervicothoracic junction alignment is within normal limits.
Bilateral posterior element alignment is within normal limits.

Skull base and vertebrae: Visualized skull base is intact. No
atlanto-occipital dissociation. C1 and C2 appear intact and aligned.
No acute osseous abnormality identified.

Soft tissues and spinal canal: No prevertebral fluid or swelling. No
visible canal hematoma. Mild calcified right carotid artery
atherosclerosis.

Disc levels: Chronic cervical disc and endplate degeneration,
maximal at C5-C6, appears stable.

Upper chest: Visible upper thoracic levels appear intact. Negative
lung apices.
IMPRESSION: 1. No acute traumatic injury identified in the cervical spine.
2. Stable chronic cervical spine degeneration.

## 2021-03-06 MED ORDER — ACETAMINOPHEN 325 MG PO TABS
650.0000 mg | ORAL_TABLET | Freq: Once | ORAL | Status: AC
  Administered 2021-03-06: 13:00:00 650 mg via ORAL
  Filled 2021-03-06: qty 2

## 2021-03-06 NOTE — Discharge Instructions (Signed)
Take Tylenol for pain and follow-up with your doctor if any problems °
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
## 2021-03-06 NOTE — ED Notes (Signed)
Pt given apple sauce and some peaches.

## 2021-03-06 NOTE — ED Triage Notes (Signed)
Pt from [facility] after she fell out of bed. Pt with lg hematoma over R eye.

## 2021-03-06 NOTE — ED Notes (Signed)
Pt continues to try and get out of bed despite being remined multiple times to remain in bed and call for assistance. Will continue to monitor.

## 2021-03-10 ENCOUNTER — Non-Acute Institutional Stay: Payer: Medicare Other | Admitting: Primary Care

## 2021-03-10 ENCOUNTER — Other Ambulatory Visit: Payer: Self-pay

## 2021-03-10 VITALS — Ht 63.0 in | Wt 134.0 lb

## 2021-03-10 DIAGNOSIS — Z515 Encounter for palliative care: Secondary | ICD-10-CM

## 2021-03-10 DIAGNOSIS — Z9181 History of falling: Secondary | ICD-10-CM

## 2021-03-10 DIAGNOSIS — R410 Disorientation, unspecified: Secondary | ICD-10-CM

## 2021-03-10 DIAGNOSIS — R413 Other amnesia: Secondary | ICD-10-CM

## 2021-03-10 NOTE — Progress Notes (Signed)
Therapist, nutritional Palliative Care Consult Note Telephone: [phone]  Fax: [phone]    Date of encounter: 03/10/21 12 noon PATIENT NAME: Amanda Fritz [address] [place] [address]   [phone] (home)  DOB: 1955/10/16 MRN: [id number] PRIMARY CARE PROVIDER:    Galvin Proffer, MD,  [address] [place] [address] [phone]  REFERRING PROVIDER:   Galvin Proffer, MD [address] [place],  [place] [address] [phone]  RESPONSIBLE PARTY:    Contact Information     Name Relation Home Work Strandquist Daughter [phone]  [phone]   Amanda Fritz Niece   [phone]        I met face to face with patient in [facility] facility. Palliative Care was asked to follow this patient by consultation request of  Hague, Myrene Galas, MD to address advance care planning and complex medical decision making. This is a follow up visit.                                   ASSESSMENT AND PLAN / RECOMMENDATIONS:   Advance Care Planning/Goals of Care: Goals include to maximize quality of life and symptom management. Our advance care planning conversation included a discussion about:    The value and importance of advance care planning  Experiences with loved ones who have been seriously ill or have died  Exploration of personal, cultural or spiritual beliefs that might influence medical decisions  Exploration of goals of care in the event of a sudden injury or illness  Identification and preparation of a healthcare agent - TBD, none yet. SNF to go forward with guardianship if daughter does not want to apply. Review of an  advance directive document . Marland Kitchen CODE STATUS: FULL  I discussed advance planning and guardianship with daughter. She has not yet applied, and we discussed her choice. She states she is unsure if she will apply for herself, and that she is open to DSS. WE discussed some EOL decisions that may  be approaching as patient has had weight loss and frequent falls with head/facial injuries. We discussed hospice as a choice for supportive care if they were ready for that. She would qualify for admission in place. We discussed her  Amanda Fritz seeking guardianship in light of patient not having capacity. I recommend limited scope due to her debility and frailty.   I completed a MOST form today. The patient and family outlined their wishes for the following treatment decisions:  Cardiopulmonary Resuscitation: Attempt Resuscitation (CPR)  Medical Interventions: Full Scope of Treatment: Use intubation, advanced airway interventions, mechanical ventilation, cardioversion as indicated, medical treatment, IV fluids, etc, also provide comfort measures. Transfer to the hospital if indicated  Antibiotics: Antibiotics if indicated  IV Fluids: IV fluids if indicated  Feeding Tube: Feeding tube long-term if indicated    Symptom Management/Plan:    I met with patient in her nursing home room. She was in bed and not interactive. I performed a physical evaluation, she has two black eyes from a fall that staff reported was within the last week. She is eating fairly well and her weight is currently 134.4 pounds.  She is minimally verbal with staff. I have placed a call to her daughter Amanda Fritz who is not a POA but we discussed her role at my last visit.   Patient has advanced care planning from 2years ago which  she signed for full scope of treatment. I do recommend a reassessment of these choices by someone who is able to make decisions on her behalf as she no longer has capacity.  I would not recommend full scope due to her frailty but would like to identify a current decision-maker with whom  to discuss my findings In light of progression of  disease and functional loss.  She does not appear uncomfortable and staff feel she is at her baseline.  Follow up Palliative Care Visit: Palliative care will continue to follow  for complex medical decision making, advance care planning, and clarification of goals. Return 4 weeks or prn.  I spent 50 minutes providing this consultation. More than 50% of the time in this consultation was spent in counseling and care coordination.   PPS: 30%  HOSPICE ELIGIBILITY/DIAGNOSIS: yes/weight loss  Chief Complaint: dementia, decline, frequent falls, weight loss  HISTORY OF PRESENT ILLNESS:  Amanda Fritz is a 65 y.o. year old female  with PSP, immobility, frequent falls, aphasia. Marland Kitchen   History obtained from review of EMR, discussion with primary team, and interview with family, facility staff/caregiver and/or Ms. Haring.  I reviewed available labs, medications, imaging, studies and related documents from the EMR.  Records reviewed and summarized above.   ROS [facility]  General: NAD ENMT: denies dysphagia Pulmonary: denies cough, denies increased SOB Abdomen: endorses fair appetite, denies constipation, endorses incontinence of bowel GU: denies dysuria, endorses incontinence of urine MSK:  endorses weakness,  ++ falls reported Skin:  facial brusing, open sore on pre tibia Neurological: denies pain on paind ad, staff denies insomnia Psych: Endorses flat  mood Heme/lymph/immuno: denies bruises, abnormal bleeding  Physical Exam: Current and past weights: 13 % Wt . loss is 21 lbs, over 4 months.  Now 134 lbs, was 155 in 9/22. Body mass index is 23.74 kg/m. Constitutional: NAD General: frail appearing, thin EYES: anicteric sclera, lids intact, no discharge  ENMT: intact hearing, oral mucous membranes moist CV: S1S2, RRR, no LE edema Pulmonary: LCTA, no increased work of breathing, no cough, room air Abdomen: intake 50-75%, no ascites GU: deferred MSK: + sarcopenia,,  non ambulatory Skin: warm and dry, bruises R periorbital area, skin lesions bil pretibias Neuro:  ++ generalized weakness,  severe  cognitive impairment, PSP deficits Psych: non-anxious affect, non verbal   Hem/lymph/immuno: no widespread bruising   Thank you for the opportunity to participate in the care of Ms. Langhorst.  The palliative care team will continue to follow. Please call our office at [phone] if we can be of additional assistance.   Jason Coop, NP DNP, AGPCNP-BC  COVID-19 PATIENT SCREENING TOOL Asked and negative response unless otherwise noted:   Have you had symptoms of covid, tested positive or been in contact with someone with symptoms/positive test in the past 5-10 days?

## 2021-04-05 ENCOUNTER — Other Ambulatory Visit: Payer: Medicare Other | Admitting: Primary Care

## 2021-04-05 ENCOUNTER — Other Ambulatory Visit: Payer: Self-pay

## 2021-04-05 VITALS — Ht 63.0 in | Wt 131.0 lb

## 2021-04-05 DIAGNOSIS — Z515 Encounter for palliative care: Secondary | ICD-10-CM

## 2021-04-05 DIAGNOSIS — R634 Abnormal weight loss: Secondary | ICD-10-CM

## 2021-04-05 DIAGNOSIS — Z9181 History of falling: Secondary | ICD-10-CM

## 2021-04-05 DIAGNOSIS — R413 Other amnesia: Secondary | ICD-10-CM

## 2021-04-05 NOTE — Progress Notes (Signed)
[facility] Consult Note Telephone: [phone]  Fax: [phone]    Date of encounter: 04/05/21 1:21 PM PATIENT NAME: Amanda Fritz [place] [address]   [phone] (home)  DOB: 1955/03/17 MRN: [id number] PRIMARY CARE PROVIDER:    Bonnita Nasuti, MD,  [address] [place] [address] [phone]  REFERRING PROVIDER:   Bonnita Nasuti, MD [address] [place],  [place] [address] [phone]  RESPONSIBLE PARTY:    Contact Information     Name Relation Home Work [place] Daughter [phone]  [phone]   Amanda Fritz Niece   [phone]       I met face to face with patient in [facility] facility. Palliative Care was asked to follow this patient by consultation request of  Fritz, Amanda Charters, MD to address advance care planning and complex medical decision making. This is a follow up visit.  ASSESSMENT AND PLAN / RECOMMENDATIONS:   Advance Care Planning/Goals of Care: Goals include to maximize quality of life and symptom management. Patient/health care surrogate gave his/her permission to discuss.Our advance care planning conversation included a discussion about:    Exploration of personal, cultural or spiritual beliefs that might influence medical decisions  Exploration of goals of care in the event of a sudden injury or illness  Identification of a healthcare agent - Discussed seeking guardianship Review of an  advance directive document . CODE STATUS: FULL MOST On file from 12/20. No guardian or POA. Discussed DSS guardianship with SNF SW. Recommend DSS if family is not in process for guardianship.  Symptom Management/Plan:  Patient eating well but has to be fed. Can feed self but stops before meal is done. She has total needs for all other adls, and 811 % dependent for iadls.  She is more alert today than on former visits. She is verbal and can answer 1  word questions appropriately. Staff denies any recent falls ( last on 03/06/21). She is able to deny pain or discomfort. She states her favorite food is 'regular food".  She appears to be enjoying being in the congregate setting.  She would qualify for hospice services due to her continued wt loss, even with eating. I would recommend to DSS and/or family for supportive services.  Follow up Palliative Care Visit: Palliative care will continue to follow for complex medical decision making, advance care planning, and clarification of goals. Return 4-6 weeks or prn.  This visit was coded based on medical decision making (MDM).  PPS: 30%  HOSPICE ELIGIBILITY/DIAGNOSIS: yes/abnormal weight loss  Chief Complaint: abnormal wt loss  HISTORY OF PRESENT ILLNESS:  Amanda Fritz is a 66 y.o. year old female  with abnormal wt loss, dementia, CAD, DM .   History obtained from review of EMR, discussion with primary team, and interview with family, facility staff/caregiver and/or Amanda Fritz.  I reviewed available labs, medications, imaging, studies and related documents from the EMR.  Records reviewed and summarized above.   ROS/ staff General: NAD ENMT: denies dysphagia Pulmonary: denies cough, denies increased SOB Abdomen: endorses good appetite, denies constipation, endorses incontinence of bowel GU: denies dysuria, endorses incontinence of urine MSK:  denies  increased weakness, no falls reported Skin: denies rashes or wounds Neurological: denies pain, denies insomnia Psych: Endorses positive mood, memory loss Heme/lymph/immuno: denies bruises, abnormal bleeding  Physical Exam: Current and past weights: 130.6 lbs 03/24/21 Body mass index is 23.21 kg/m. 16 % Wt .  loss is 25 lbs, over 5 months. 155  lbs in 9/22. Body mass index is 23.74 kg/m. Constitutional: NAD General: frail appearing, thin EYES: anicteric sclera, lids intact, no discharge  ENMT: intact hearing, oral mucous membranes moist,   edentulous CV:  RRR, no LE edema Pulmonary: LCTA, no increased work of breathing, no cough, room air Abdomen: intake 75-100%, no ascites GU: deferred MSK: + sarcopenia, moves all extremities, non  ambulatory Skin: warm and dry, no rashes or wounds on visible skin Neuro:  + generalized weakness,  severe cognitive impairment Psych: non-anxious affect, A and O x 1 Hem/lymph/immuno: no widespread bruising   Thank you for the opportunity to participate in the care of Amanda Fritz.  The palliative care team will continue to follow. Please call our office at [phone] if we can be of additional assistance.   Jason Coop, NP DNP, AGPCNP-BC  COVID-19 PATIENT SCREENING TOOL Asked and negative response unless otherwise noted:   Have you had symptoms of covid, tested positive or been in contact with someone with symptoms/positive test in the past 5-10 days?

## 2021-05-03 ENCOUNTER — Other Ambulatory Visit: Payer: Self-pay

## 2021-05-03 ENCOUNTER — Non-Acute Institutional Stay: Payer: Medicare Other | Admitting: Primary Care

## 2021-05-03 DIAGNOSIS — Z515 Encounter for palliative care: Secondary | ICD-10-CM

## 2021-05-03 DIAGNOSIS — R634 Abnormal weight loss: Secondary | ICD-10-CM

## 2021-05-03 DIAGNOSIS — R531 Weakness: Secondary | ICD-10-CM

## 2021-05-03 DIAGNOSIS — R413 Other amnesia: Secondary | ICD-10-CM

## 2021-05-03 NOTE — Progress Notes (Signed)
Therapist, nutritional Palliative Care Consult Note Telephone: [phone]  Fax: [phone]   Due to the COVID-19 crisis, this visit was done via telemedicine from my office and it was initiated and consent by this patient and or family.  I connected with  Amanda Fritz OR PROXY on 05/03/21 by a telemedicine application and verified that I am speaking with the correct person using two identifiers.   I discussed the limitations of evaluation and management by telemedicine. The patient expressed understanding and agreed to proceed.  Date of encounter: 05/03/21 12:50 PM PATIENT NAME: Amanda Fritz [address] [place] [address]   [phone] (home)  DOB: [birth year] MRN: [id number] PRIMARY CARE PROVIDER:    Galvin Proffer, MD,  [address] [place] [address] [phone]  REFERRING PROVIDER:   Galvin Proffer, MD [address] [place],  [place] [address] [phone]  RESPONSIBLE PARTY:    Contact Information     Name Relation Home Work [place] Daughter [phone]  [phone]   Amanda Fritz Niece   [phone]       Palliative Care was asked to follow this patient by consultation request of  Hague, Myrene Galas, MD to address advance care planning and complex medical decision making. This is a follow up visit.                   ASSESSMENT AND PLAN / RECOMMENDATIONS:   Advance Care Planning/Goals of Care: Goals include to maximize quality of life and symptom management. Patient/health care surrogate gave his/her permission to discuss.Our advance care planning conversation included a discussion about:    Exploration of personal, cultural or spiritual beliefs that might influence medical decisions  Exploration of goals of care in the event of a sudden injury or illness  Identification of a healthcare agent  Review of an advance directive document. Call into SNF SW to discuss guardianship. Discussed with Amanda Fritz again for submission for guardianship for [facility] for Amanda Fritz. She will pursue. Received a call from daughter last week asking for assessment for hospice services. Patient seems to have stabilized and maybe improved some in her intake and activity. Spoke with daughter [name] RE guardianship  on 2/17. She states she is not planning to pursue guardianship and was expecting  SNF to pursue. Call in to discuss, no answer, Message left. Patient no longer has capacity to be responsible for own decisions due to advanced dementia and cognitive impairment. CODE STATUS: FULL CODE  Symptom Management/Plan:  Abdomen: intake 75-100%, Wt 128.3 lbs, was 139 lbs in 11/22. 8% loss. Endorses being fed and taking mighty shakes.  She sometimes attempts to feed self but staff is having to finish for her to eat all her food. She is losing weight also subjectively per staff and per weights.  Also feels she's hydrating well.   Behaviors: She is compliant with care and activities most days. Some days she does not want to participate but usually she does. No c/o pain, insomnia or agitation, or falls.  Incontinence: Bowel and bladder are incontinent. BM are regular. No skin break down.  Follow up Palliative Care Visit: Palliative care will continue to follow for complex medical decision making, advance care planning, and clarification of goals. Return 4-6 weeks or prn.  I spent 25 minutes providing this consultation. More than 50% of the time in this consultation was spent in counseling and care coordination.  PPS: 30%  HOSPICE ELIGIBILITY/DIAGNOSIS: does not yet meet criteria  Chief Complaint: weight loss, self care deficits  HISTORY OF PRESENT ILLNESS:  Amanda Fritz is a 66 y.o. year old female  with dementia, weight loss, debility.  Assessed today for continued decline and hospice eligibility.  History obtained from review of EMR, discussion with primary team, and interview with family, facility  staff/caregiver and/or Amanda Fritz.  I reviewed available labs, medications, imaging, studies and related documents from the EMR.  Records reviewed and summarized above.   ROS  General: NAD ENMT: denies dysphagia Pulmonary: denies cough, denies increased SOB Abdomen: endorses good appetite, denies constipation, endorses incontinence of bowel GU: denies dysuria, endorses incontinence of urine MSK:  denies  increased weakness,  no falls reported Skin: denies rashes or wounds Neurological: denies pain, denies insomnia, talking and engaging in activities Psych: Endorses positive mood Heme/lymph/immuno: denies bruises, abnormal bleeding  Physical Exam: Deferred  Thank you for the opportunity to participate in the care of Amanda Fritz.  The palliative care team will continue to follow. Please call our office at [phone] if we can be of additional assistance.   Eliezer Lofts, NP DNP, AGPCNP-BC  COVID-19 PATIENT SCREENING TOOL Asked and negative response unless otherwise noted:   Have you had symptoms of covid, tested positive or been in contact with someone with symptoms/positive test in the past 5-10 days?

## 2021-05-04 ENCOUNTER — Other Ambulatory Visit: Payer: Self-pay

## 2021-05-04 ENCOUNTER — Non-Acute Institutional Stay: Payer: Medicare Other | Admitting: Primary Care

## 2021-05-04 DIAGNOSIS — R634 Abnormal weight loss: Secondary | ICD-10-CM

## 2021-05-04 DIAGNOSIS — Z515 Encounter for palliative care: Secondary | ICD-10-CM

## 2021-05-04 DIAGNOSIS — R413 Other amnesia: Secondary | ICD-10-CM

## 2021-05-04 NOTE — Progress Notes (Signed)
Therapist, nutritional Palliative Care Consult Note Telephone: [phone]  Fax: [phone]    Due to the COVID-19 crisis, this visit was done via telemedicine from my office and it was initiated and consent by this patient and or family.  I connected with  Penny Pia OR PROXY on 05/04/21 by a  telemedicine application and verified that I am speaking with the correct person using two identifiers.   I discussed the limitations of evaluation and management by telemedicine. The patient /proxy expressed understanding and agreed to proceed.  Date of encounter: 05/04/21 PATIENT NAME: Jolly Mango [address] [place] [address]   [phone] (home)  DOB: [birth year] MRN: [id number] PRIMARY CARE PROVIDER:    Galvin Proffer, MD,  [address] [place] [address] [phone]  REFERRING PROVIDER:   Galvin Proffer, MD [address] [place],  [place] [address] [phone]  RESPONSIBLE PARTY:    Contact Information     Name Relation Home Work [place] Daughter [phone]  [phone]   Joycie Peek Niece   [phone]        Palliative Care was asked to follow this patient by consultation request of  Hague, Myrene Galas, MD to address advance care planning.                                   ASSESSMENT AND PLAN / RECOMMENDATIONS:   Advance Care Planning/Goals of Care: Goals include to maximize quality of life and symptom management. Patient/health care surrogate gave his/her permission to discuss.Our advance care planning conversation included a discussion about:    The value and importance of advance care planning  Exploration of personal, cultural or spiritual beliefs that might influence medical decisions  Exploration of goals of care in the event of a sudden injury or illness  Identification f a healthcare agent - SNF is applying to DSS for guardianship Review  of an  advance directive document .  CODE  STATUS: FULL  I discussed patient's current health status with daughter, next of kin. I reviewed her weight loss and that she may be approaching hospice eligibility. Currently she does not have a weight loss at 10% in  the last three months. She has been eating better and more interactive.   We discussed the hospice program and philosophy and what hospice could bring to her mother in the assisted living setting. This would include comprehensive psychosocial and spiritual services in addition to symptom management and medical oversight in end of life care. We discussed decision for DNR or full code.  Currently daughter is expecting DSS to take over as guardian. She however would like to be apprised and part of the discussion for healthcare decisions. I let her know this would be up to DSS but invited her to call them and discuss her concerns.  I spent time in education about terminal illness and disease progression especially in light of dementia diagnosis.  Follow up Palliative Care Visit: Palliative care will continue to follow for complex medical decision making, advance care planning, and clarification of goals. Return 4-6 weeks or prn.  I spent 30 minutes providing this consultation. More than 50% of the time in this consultation was spent in counseling and care coordination.  Thank you for the opportunity to participate in the care of Ms. Graber.  The palliative care team will continue to follow. Please  call our office at [phone] if we can be of additional assistance.   Eliezer Lofts DNP, MPH, AGPCNP-BC, ACHPN   COVID-19 PATIENT SCREENING TOOL Asked and negative response unless otherwise noted:   Have you had symptoms of covid, tested positive or been in contact with someone with symptoms/positive test in the past 5-10 days?

## 2021-06-30 ENCOUNTER — Non-Acute Institutional Stay: Payer: Medicare Other | Admitting: Primary Care

## 2021-07-01 ENCOUNTER — Non-Acute Institutional Stay: Payer: Medicare Other | Admitting: Primary Care

## 2021-07-01 DIAGNOSIS — Z515 Encounter for palliative care: Secondary | ICD-10-CM

## 2021-07-01 DIAGNOSIS — R410 Disorientation, unspecified: Secondary | ICD-10-CM

## 2021-07-01 DIAGNOSIS — Z9181 History of falling: Secondary | ICD-10-CM

## 2021-07-01 DIAGNOSIS — R531 Weakness: Secondary | ICD-10-CM

## 2021-07-01 DIAGNOSIS — R413 Other amnesia: Secondary | ICD-10-CM

## 2021-07-01 NOTE — Progress Notes (Signed)
? ? ?Manufacturing engineer ?Community Palliative Care Consult Note ?Telephone: [phone]  ?Fax: [phone]  ? ? ?Date of encounter: 07/01/21 ?12:16 PM ?PATIENT NAME: Amanda Fritz ?[facility] ?[address] ?[place] [address]   ?[phone] (home)  ?DOB: [birth year] ?MRN: [id number] ?PRIMARY CARE PROVIDER:    ?Bonnita Nasuti, MD,  ?[address] ?[place] [address] ?[phone] ? ?REFERRING PROVIDER:   ?Bonnita Nasuti, MD ?[address] ?[place],  [place] [address] ?[phone] ? ?RESPONSIBLE PARTY:    ?Contact Information   ? ? Name Relation Home Work [place]  ? [name],[name] Daughter [phone]  [phone]  ? LONG,DELORES Niece   [phone]  ? ?  ? ? ? ?I met face to face with patient in [facility] facility. Palliative Care was asked to follow this patient by consultation request of  Hague, Rosalyn Charters, MD to address advance care planning and complex medical decision making. This is a follow up visit. ? ?                                 ASSESSMENT AND PLAN / RECOMMENDATIONS:  ? ?Advance Care Planning/Goals of Care: Goals include to maximize quality of life and symptom management.  ?Identification of a healthcare agent -now [place] ? ?CODE STATUS: DNR ? ?Symptom Management/Plan: ? ?I met with Ms Olheiser in her memory care area. She has just eaten a big lunch and is able to self feed.  She is able to interact with me with minimal words but appropriate. Staff states she is eating well and  is up most days. They deny behavior disturbances. However despite her intake she is continuing to lose weight, now at 122 pounds which is a 12% drop from December 2022. I had earlier recommended hospice due to this weight loss. However in the interim she has become a ward of [facility]. Daughter had declined to file for guardianship. I will reach out to DSS to introduce myself on the case and know that there office will be making any further medical decisions on her behalf.  ? ? ?Follow up  Palliative Care Visit: Palliative care will continue to follow for complex medical decision making, advance care planning, and clarification of goals. Return 4-6 weeks or prn. ? ?This visit was coded based on medical decision making (MDM). ? ?PPS: 30% ? ?HOSPICE ELIGIBILITY/DIAGNOSIS: TBD ? ?Chief Complaint: weight loss, dementia ? ?HISTORY OF PRESENT ILLNESS:  Amanda Fritz is a 66 y.o. year old female  with dementia, h/o CVA, weight loss . Patient seen today to review palliative care needs to include medical decision making and advance care planning as appropriate.  ? ?History obtained from review of EMR, discussion with primary team, and interview with family, facility staff/caregiver and/or Ms. Barbero.  ?I reviewed available labs, medications, imaging, studies and related documents from the EMR.  Records reviewed and summarized above.  ? ?ROS/staff ? ? ?General: NAD ?ENMT: denies dysphagia ?Pulmonary: denies cough, denies increased SOB ?Abdomen: endorses good appetite, denies constipation, endorses incontinence of bowel ?GU: denies dysuria, endorses incontinence of urine ?MSK:  denies  increased weakness,  no falls reported ?Skin: denies rashes or wounds ?Neurological: denies pain, denies insomnia ?Psych: Endorses positive mood ? ?Physical Exam: ?Current and past weights: 122 lbs, 06/24/21, continued weight loss of 6 lb. Was 139 lbs in 12/23.  This is 17 lb loss or 12%. ?Constitutional: NAD ?General: frail appearing,  thin ?EYES: anicteric sclera, lids intact, no discharge  ?ENMT: intact hearing, oral mucous membranes moist, dentition intact ?CV:  no LE edema ?Pulmonary: no increased work of breathing, no cough, room air ?Abdomen: intake 100%, soft and non tender, no ascites ?MSK: + sarcopenia, moves all extremities, non  ambulatory ?Skin: warm and dry, no rashes or wounds on visible skin ?Neuro:  no  new generalized weakness,  severe  cognitive impairment, non-anxious affect ? ? ?Thank you for the opportunity to  participate in the care of Ms. Chapdelaine.  The palliative care team will continue to follow. Please call our office at [phone] if we can be of additional assistance.  ? ?Jason Coop, NP DNP, AGPCNP-BC ? ?COVID-19 PATIENT SCREENING TOOL ?Asked and negative response unless otherwise noted:  ? ?Have you had symptoms of covid, tested positive or been in contact with someone with symptoms/positive test in the past 5-10 days?  ? ?

## 2021-08-12 DEATH — deceased
